# Patient Record
Sex: Male | Born: 1950 | Race: White | Hispanic: No | Marital: Married | Smoking: Former smoker
Health system: Southern US, Community
[De-identification: ages and names within clinical notes are randomized; demographics above are authoritative.]

## PROBLEM LIST (undated history)

## (undated) DIAGNOSIS — G1221 Amyotrophic lateral sclerosis: Secondary | ICD-10-CM

## (undated) DIAGNOSIS — Z9109 Other allergy status, other than to drugs and biological substances: Secondary | ICD-10-CM

---

## 2015-06-23 DIAGNOSIS — I2510 Atherosclerotic heart disease of native coronary artery without angina pectoris: Secondary | ICD-10-CM | POA: Diagnosis present

## 2015-06-23 DIAGNOSIS — Z789 Other specified health status: Secondary | ICD-10-CM | POA: Insufficient documentation

## 2020-10-20 ENCOUNTER — Other Ambulatory Visit (HOSPITAL_COMMUNITY): Payer: Self-pay | Admitting: *Deleted

## 2020-10-20 DIAGNOSIS — R1310 Dysphagia, unspecified: Secondary | ICD-10-CM

## 2020-10-26 ENCOUNTER — Ambulatory Visit (HOSPITAL_COMMUNITY)
Admission: RE | Admit: 2020-10-26 | Discharge: 2020-10-26 | Disposition: A | Discharge status: Home / Self Care | Payer: Medicare Other | Source: Ambulatory Visit | Attending: Neurology | Admitting: Neurology

## 2020-10-26 ENCOUNTER — Other Ambulatory Visit: Payer: Self-pay

## 2020-10-26 DIAGNOSIS — R1310 Dysphagia, unspecified: Secondary | ICD-10-CM | POA: Insufficient documentation

## 2020-10-26 NOTE — Progress Notes (Addendum)
Modified Barium Swallow Progress Note  Patient Details  Name: Donald Gould MRN: [id number] Date of Birth: 03/09/1951  Today's Date: 10/26/2020  Modified Barium Swallow completed.  Full report located under Chart Review in the Imaging Section.  Brief recommendations include the following:  Clinical Impression  Pt presents with functional oropharyngeal swallow c/b adequate bolus cohesion, BOT retraction and strength of laryngeal vestibule closure, resulting in no aspiration of solid or liquid POs. Occasional flash penetration noted with thin liquids, however strength of airway closure ejected material fully (PAS 2).  Some prolonged bolus formation noted and question reported xerostomia vs oral weakness. Mildly reduced pharyngeal constriction resulted in residue in the vallecula, pyriforms and posterior pharyngeal wall. Subsequent spontaneous dry swallows eliminated this presentation. Recommend regular/thin liquid diet with universal swallow precautions (small bites/sips, slow rate of intake, upright positioning). Wife states that choking episodes do not occur daily, but seem to happen in the evening time and question impact of lethargy/muscle fatigue. Educated wife and pt regarding swallow precautions and need for increased precaution when more fatigued. Referred to OP SLP services to address further.   Swallow Evaluation Recommendations       SLP Diet Recommendations: Regular solids;Thin liquid   Liquid Administration via: Cup;Straw   Medication Administration: Whole meds with liquid   Supervision: Patient able to self feed   Compensations: Minimize environmental distractions;Slow rate;Small sips/bites;Follow solids with liquid   Postural Changes: Seated upright at 90 degrees   Oral Care Recommendations: Oral care BID      Donald Gould, [place], [place] Office Number: [phone]   Acie Fredrickson 10/26/2020,4:14 PM

## 2020-11-23 ENCOUNTER — Other Ambulatory Visit: Payer: Self-pay

## 2020-11-23 ENCOUNTER — Encounter: Payer: Self-pay | Admitting: Speech Pathology

## 2020-11-23 ENCOUNTER — Ambulatory Visit: Payer: Medicare Other | Attending: Neurology | Admitting: Speech Pathology

## 2020-11-23 DIAGNOSIS — R1312 Dysphagia, oropharyngeal phase: Secondary | ICD-10-CM | POA: Diagnosis present

## 2020-11-23 DIAGNOSIS — R471 Dysarthria and anarthria: Secondary | ICD-10-CM | POA: Diagnosis not present

## 2020-11-23 NOTE — Therapy (Signed)
[place] MAIN [facility] SERVICES [address] [place], [place], [address] Phone: [phone]   Fax:  [phone]  Speech Language Pathology Evaluation  Patient Details  Name: Donald Gould MRN: [id number] Date of Birth: 01/31/1951 Referring Provider (SLP): Silvestre Moment, MD   Encounter Date: 11/23/2020   End of Session - 11/23/20 1351     Visit Number 1    Number of Visits 12    Date for SLP Re-Evaluation 02/21/21    SLP Start Time 1100    SLP Stop Time  1200    SLP Time Calculation (min) 60 min    Activity Tolerance Patient tolerated treatment well             History reviewed. No pertinent past medical history.  History reviewed. No pertinent surgical history.  There were no vitals filed for this visit.   Subjective Assessment - 11/23/20 1107     Subjective "Speech is the worst."                SLP Evaluation [facility] - 11/23/20 1314       SLP Visit Information   SLP Received On 11/23/20    Referring Provider (SLP) Silvestre Moment, MD    Onset Date 10/23/2020   referral date; pt reports onset in 2020   Medical Diagnosis speech disturbance, dysphagia      General Information   HPI Patient is a 70 year old male with past medical history of TIA, elevated PSA, referred by neurologist in [facility] for slurred speech and dysphagia. Per neurologist, DDx includes Frontotemporal dementia, PSP. 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 Recently relocated to [facility] [place] [facility]. Patient had gradual speech changes beginning in 2020. This has been progressive, and patient began to notice difficulty with swallowing.  MBS performed 10/26/2020 with recommendation for regular/thin diet with aspiration precautions; residue was noted in the valleculae, pyriforms, and posterior pharyngeal wall.    Behavioral/Cognition alert, cooperative, pleasant mood    Mobility  Status ambulated unassisted      Balance Screen   Has the patient fallen in the past 6 months No    Has the patient had a decrease in activity level because of a fear of falling?  No    Is the patient reluctant to leave their home because of a fear of falling?  No      Prior Functional Status   Cognitive/Linguistic Baseline Within functional limits    Type of Home House     Lives With Spouse    Available Support Family    Vocation Retired      Pain Assessment   Pain Assessment No/denies pain      Cognition   Overall Cognitive Status Within Functional Limits for tasks assessed      Auditory Comprehension   Overall Auditory Comprehension Appears within functional limits for tasks assessed      Visual Recognition/Discrimination   Discrimination Not tested      Reading Comprehension   Reading Status Within funtional limits      Expression   Primary Mode of Expression Verbal      Verbal Expression   Overall Verbal Expression Appears within functional limits for tasks assessed    Initiation No impairment    Automatic Speech Name;Social Response    Level of Generative/Spontaneous Verbalization Conversation    Repetition No impairment    Naming No impairment    Pragmatics No impairment  Written Expression   Written Expression Not tested      Oral Motor/Sensory Function   Overall Oral Motor/Sensory Function Impaired    Labial ROM Reduced right;Reduced left    Labial Symmetry Within Functional Limits    Labial Strength Reduced    Lingual ROM Reduced right;Reduced left    Lingual Symmetry Within Functional Limits    Lingual Strength Reduced   weakness, appearance of atrophy, ?fasciulations vs tremor   Lingual Coordination Reduced    Facial ROM Reduced right;Reduced left    Facial Symmetry Within Functional Limits    Velum Within Functional Limits    Mandible Within Functional Limits    Overall Oral Motor/Sensory Function moderate impairment      Motor Speech    Overall Motor Speech Impaired    Respiration Within functional limits    Phonation Wet;Low vocal intensity;Other (comment)   gravelly quality   Articulation Impaired    Level of Impairment Phrase    Intelligibility Intelligibility reduced    Conversation 75-100% accurate   90% for trained listener in quiet environment     Standardized Assessments   Standardized Assessments  Other Assessment ; see motor speech evaluation below            Motor Speech Evaluation    Oral Motor Examination:   Alternating Motion Rate: 7-11 repetitions in 5 seconds (30-35 repetitions/ 5 seconds average for /p/)  Sequential Motion Rate: 4 repetitions in 5 seconds (slow, regular, labored)   Respiration: appears WFL    Phonation   Vocal quality: wet, tremulous, gravelly  Maximum phonation time for sustained "ah": 8.9 seconds   Average fundamental frequency during sustained "ah": '100Hz'$    (1.9 SD below average of  145 Hz +/- 23 for gender)    Habitual pitch: 105 Hz  Average dB conversation: 78 at 50 cm    Highest dynamic pitch in conversational speech: 127 Hz   Lowest dynamic pitch in conversational speech: 92 Hz   Average time patient was able to sustain /s/:  4.3 seconds   Average time patient was able to sustain /z/: 4.2 seconds   s/z ratio:  (suggestive of dysfunction > 1.0) 1.02  Resonance:  mild hypernasality in connected speech  Articulation  Connected Speech characteristics: slow rate, imprecise consonants (particularly lingual consonants, plosives), wet, gravelly vocal quality, low pitch  Intelligibility: For trained listener with context in a quiet environment, intelligibility rated as approximately 90 %  Non-verbal oral apraxia: not present        SLP Education - 11/23/20 1350     Education Details changes in speech appear neurological, course of ST would be focused on compensations/ communicative effectiveness vs restoration    Person(s) Educated Patient;Spouse     Methods Explanation    Comprehension Verbalized understanding              SLP Short Term Goals - 11/23/20 1353       SLP SHORT TERM GOAL #1   Title Patient will demonstrate dysarthria HEP with modified independence.    Time 10    Period --   visits   Status New      SLP SHORT TERM GOAL #2   Title Patient will use compensations for dysarthria for paragraph reading level 90% accuracy.    Time 10    Period Weeks    Status New      SLP SHORT TERM GOAL #3   Title Patient will complete clinical swallowing assessment with goals added PRN  Time 2    Period Weeks    Status New              SLP Long Term Goals - 11/23/20 1356       SLP LONG TERM GOAL #1   Title Patient will use compensations for dysarthria in 15 minutes mod complex conversation for greater than 95% intelligibility.    Time 12    Period Weeks    Status New    Target Date 02/21/21      SLP LONG TERM GOAL #2   Title Patient will verbalize signs/symptoms of aspiration and aspiration pneumonia.    Time 12    Period Weeks    Status New    Target Date 02/21/21      SLP LONG TERM GOAL #3   Title Patient will demonstrate knowledge of external aids/apps for augmentative/alternative communication    Time 12    Period Weeks    Status New    Target Date 02/21/21              Plan - 11/23/20 1351     Clinical Impression Statement Patient presents with moderate mixed flaccid-spastic dysarthria characterized by slow rate, weak, imprecise consonants (linguals, plosives), wet/gravelly vocal quality, excess/equal stress and mild hypernasality in connected speech. Presence of lingual atrophy, ?lingual fasciculations vs tremor, lingual and labial weakness, decreased range of motion bilaterally. Patient reports gradual deterioration in speech, swallowing. He feels some days are worse than others; he has not noticed pattern or time of day when speech is worse. Recently moved to area and is establishing care with  a neurologist; first appointment is in November. SLP educated on proposed course of therapy focusing on maintaining communicative and swallowing function, compensatory strategies and augmentative/alternative communication given what appears to be progressive neurologic dysarthria and dysphagia.   Speech Therapy Frequency 1x /week    Duration 12 weeks    Treatment/Interventions Aspiration precaution training;Cueing hierarchy;SLP instruction and feedback;Pharyngeal strengthening exercises;Compensatory techniques;Functional tasks;Compensatory strategies;Diet toleration management by SLP;Trials of upgraded texture/liquids;Multimodal communcation approach;Internal/external aids;Patient/family education    Potential to Achieve Goals Fair    Potential Considerations Medical prognosis   suspect progressive neurologic etiology   Consulted and Agree with Plan of Care Patient;Family member/caregiver    Family Member Consulted wife             Patient will benefit from skilled therapeutic intervention in order to improve the following deficits and impairments:   Dysarthria and anarthria  Dysphagia, oropharyngeal phase    Problem List There are no problems to display for this patient.  Deneise Lever, [place], CCC-SLP Speech-Language Pathologist   Aliene Altes 11/23/2020, 6:23 PM  [place] MAIN [facility] SERVICES [address] [place], [place], [address] Phone: [phone]   Fax:  [phone]  Name: Donald Gould MRN: [id number] Date of Birth: 05-11-1950

## 2020-11-28 NOTE — Addendum Note (Signed)
Addended by: Aliene Altes on: 11/28/2020 05:09 PM   Modules accepted: Orders

## 2020-11-29 ENCOUNTER — Ambulatory Visit: Payer: Medicare Other | Admitting: Speech Pathology

## 2020-11-29 ENCOUNTER — Other Ambulatory Visit: Payer: Self-pay

## 2020-11-29 DIAGNOSIS — R471 Dysarthria and anarthria: Secondary | ICD-10-CM | POA: Diagnosis not present

## 2020-11-29 DIAGNOSIS — R1312 Dysphagia, oropharyngeal phase: Secondary | ICD-10-CM

## 2020-11-29 NOTE — Patient Instructions (Addendum)
Swallowing precautions  Sit fully upright when you eat and drink.  Take small bites and sips. One bite/sip at a time. Swallow everything in your mouth before you take another bite or sip.  Particularly when you eat solid food, you should swallow twice. You can also take a sip of liquid to help wash things down.  Your swallow test showed that you sometimes have food leftover in your mouth and in your throat after you swallow. Food that doesn't go down increases the risk that you will aspirate it . Aspiration is when something goes "down the wrong pipe" (your windpipe instead of your esophagus). You didn't aspirate on your swallow test, but you are at increased risk for this happening since all of the food/liquid doesn't go down at once. If you are coughing, choking, or "getting strangled," this can be a sign that you might be aspirating. If your swallowing gets worse, let your doctor or speech therapist know so that we can do a test to see if you may be aspirating, and to see what is the safest thing for you to eat and drink.  Aspiration can lead to developing aspiration pneumonia.    Signs of Aspiration Pneumonia   Chest pain/tightness Fever (can be low grade) Cough  With foul-smelling phlegm (sputum) With sputum containing pus or blood With greenish sputum Fatigue  Shortness of breath  Wheezing   **IF YOU HAVE THESE SIGNS, CONTACT YOUR DOCTOR OR GO TO THE EMERGENCY DEPARTMENT OR URGENT CARE AS SOON AS POSSIBLE**       Speech Practice  SLOW LOUD OVER-ENNUNCIATE PAUSE   Every Day Sayings (you can add more as you think of them: things you say a lot, people you talk to frequently, places you go, things you watch on TV, etc). Practice these using your SLOP strategies.   What time are we playing golf?  Where is our Bubba cup?  Where should we go to church?  When are we going to the grocery store?  I need grapes and a melon.   Good morning.   What's for  supper?  [name]  Donald  [employer]  [employer]  [employer]  [employer]  [employer]     Additional Practice:    SLOW AND BIG - EXAGGERATE YOUR MOUTH, MAKE EACH CONSONANT. Repeat each one 3 times.   Call the cat "Buttercup" A calendar of [place], [place] Four floors to cover Yellow oil ointment Fellow lovers of felines Catastrophe in Alfred' plums The church's chimes chimed Telling time 'til eleven Five valve levers Keep the gate closed Go see that guy Fat cows give milk [employer] Gophers Fat frogs flip freely Kohl's into bed Get that game to Greg Thick thistles stick together Cinnamon aluminum linoleum Black bugs blood Lovely lemon linament Red leather, yellow leather  Big grocery buggy    Purple baby carriage [facility] Proper copper coffee pot Ripe purple cabbage Three free throws [employer] tackled  [employer] dipped the dessert  Duke Golovin that [employer] of [employer] Shirts shrink, shells shouldn't [place] 49ers Take the tackle box File the flash message Give me five flapjacks Fundamental relatives Dye the pets purple Talking [place] time after time Dark chocolate chunks Political landscape of the kingdom Estate manager/land agent genius We played yo-yos yesterday

## 2020-11-30 NOTE — Therapy (Signed)
[place] MAIN [facility] SERVICES [address] [place], [place], [address] Phone: [phone]   Fax:  [phone]  Speech Language Pathology Treatment  Patient Details  Name: Donald Gould MRN: [id number] Date of Birth: [birth year] Referring Provider (SLP): Silvestre Moment, MD   Encounter Date: 11/29/2020   End of Session - 11/30/20 1018     Visit Number 2    Number of Visits 12    Date for SLP Re-Evaluation 02/21/21    SLP Start Time 1500    SLP Stop Time  1600    SLP Time Calculation (min) 60 min    Activity Tolerance Patient tolerated treatment well             No past medical history on file.  No past surgical history on file.  There were no vitals filed for this visit.   Subjective Assessment - 11/29/20 1502     Subjective "My lips aren't tingling but it's like they're numb."    Currently in Pain? No/denies                   ADULT SLP TREATMENT - 11/30/20 0848       General Information   Behavior/Cognition Alert;Cooperative;Pleasant mood    HPI Patient is a 70 year old male with past medical history of TIA, elevated PSA, referred by neurologist in [facility] for slurred speech and dysphagia. Per neurologist, DDx includes Frontotemporal dementia, PSP. 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 Recently relocated to [facility]. Patient had gradual speech changes beginning in 2020. This has been progressive, and patient began to notice difficulty with swallowing.  MBS performed 10/26/2020 with recommendation for regular/thin diet with aspiration precautions; residue was noted in the valleculae, pyriforms, and posterior pharyngeal wall.      Treatment Provided   Treatment provided Dysphagia;Cognitive-Linquistic      Dysphagia Treatment   Temperature Spikes Noted No    Respiratory Status Room air    Oral Cavity - Dentition Adequate  natural dentition    Treatment Methods Skilled observation;Differential diagnosis;Compensation strategy training;Patient/caregiver education    Patient observed directly with PO's Yes    Type of PO's observed Regular;Dysphagia 1 (puree)    Feeding Able to feed self    Liquids provided via Cup    Oral Phase Signs & Symptoms Prolonged mastication;Prolonged bolus formation;Other (comment)   oral residue, copious thick saliva   Pharyngeal Phase Signs & Symptoms Delayed cough    Type of cueing Verbal    Amount of cueing Minimal    Other treatment/comments Administered clinical swallowing evaluation, using thin liquids, puree and regular solids. Patient self-fed; he reported feeling his mouth was "dry" however had copious saliva; further questioning reveals that pt perceives dry mouth due to the thickness of his saliva. Noted with multiple swallows (piecemealing? vs clearance of pharyngeal residue) per bite/sip. There is diffuse oral residue with solids, mixed with thick secretions throughout oral cavity. No overt signs of aspiration initially, however pt had delayed cough x1 approximately 60 seconds after PO administration; suspect related to secretion management. Pt acknowledged he coughs on his saliva. Reviewed aspiration precautions and strategies including multiple swallows, alternating solids and liquids after reviewing his MBS (both report and images). Provided handout with precautions and signs of aspiration PNA.      Pain Assessment   Pain Assessment No/denies pain      Cognitive-Linquistic Treatment   Treatment focused on Dysarthria;Patient/family/caregiver  education    Skilled Treatment Educated on dysarthria compensations and collaborated with patient to generate list of personally relevant words and phrases. Reviewed strategies, specifically pausing between words to increase intelligibility, and pt returned demonstration with mod cues. Provided HEP      Assessment / Recommendations / Plan    Plan Continue with current plan of care      Progression Toward Goals   Progression toward goals Progressing toward goals                SLP Short Term Goals - 11/23/20 1353       SLP SHORT TERM GOAL #1   Title Patient will demonstrate dysarthria HEP with modified independence.    Time 10    Period --   visits   Status New      SLP SHORT TERM GOAL #2   Title Patient will use compensations for dysarthria for paragraph reading level 90% accuracy.    Time 10    Period Weeks    Status New      SLP SHORT TERM GOAL #3   Title Patient will complete clinical swallowing assessment with goals added PRN    Time 2    Period Weeks    Status New              SLP Long Term Goals - 11/23/20 1356       SLP LONG TERM GOAL #1   Title Patient will use compensations for dysarthria in 15 minutes mod complex conversation for greater than 95% intelligibility.    Time 12    Period Weeks    Status New    Target Date 02/21/21      SLP LONG TERM GOAL #2   Title Patient will verbalize signs/symptoms of aspiration and aspiration pneumonia.    Time 12    Period Weeks    Status New    Target Date 02/21/21      SLP LONG TERM GOAL #3   Title Patient will demonstrate knowledge of external aids/apps for augmentative/alternative communication    Time 12    Period Weeks    Status New    Target Date 02/21/21              Plan - 11/30/20 1019     Clinical Impression Statement Patient presents with moderate mixed flaccid-spastic dysarthria characterized by slow rate, weak, imprecise consonants (linguals, plosives), wet/gravelly vocal quality, excess/equal stress and mild hypernasality in connected speech. Presence of lingual atrophy, ?lingual fasciculations vs tremor, lingual and labial weakness, decreased range of motion bilaterally. Clinical swallowing assessment today confirms oral deficits/weakness and excessive, thick saliva. Reviewed aspiration precautions with patient and  provided handout. Continue skilled ST with focus on maintaining communicative and swallowing function, compensatory strategies and augmentative/alternative communication given what appears to be progressive neurologic dysarthria and dysphagia.    Speech Therapy Frequency 1x /week    Duration 12 weeks    Treatment/Interventions Aspiration precaution training;Cueing hierarchy;SLP instruction and feedback;Pharyngeal strengthening exercises;Compensatory techniques;Functional tasks;Compensatory strategies;Diet toleration management by SLP;Trials of upgraded texture/liquids;Multimodal communcation approach;Internal/external aids;Patient/family education    Potential to Achieve Goals Fair    Potential Considerations Medical prognosis   suspect progressive neurologic etiology   Consulted and Agree with Plan of Care Patient;Family member/caregiver    Family Member Consulted wife             Patient will benefit from skilled therapeutic intervention in order to improve the following deficits and impairments:   Dysphagia,  oropharyngeal phase  Dysarthria and anarthria    Problem List There are no problems to display for this patient.  Deneise Lever, [place], CCC-SLP Speech-Language Pathologist   Aliene Altes 11/30/2020, 10:26 AM  [place] MAIN [facility] SERVICES [address] [place], [place], [address] Phone: [phone]   Fax:  [phone]   Name: Donald Gould MRN: [id number] Date of Birth: [birth year]

## 2020-12-06 ENCOUNTER — Other Ambulatory Visit: Payer: Self-pay

## 2020-12-06 ENCOUNTER — Ambulatory Visit: Payer: Medicare Other | Admitting: Speech Pathology

## 2020-12-06 DIAGNOSIS — R471 Dysarthria and anarthria: Secondary | ICD-10-CM | POA: Diagnosis not present

## 2020-12-06 NOTE — Therapy (Signed)
[facility] [address] [place], [place], [address] Phone: [phone]   Fax:  [phone]  Speech Language Pathology Treatment  Patient Details  Name: [facility] MRN: [id number] Date of Birth: 1950/05/03 Referring Provider (SLP): Silvestre Moment, MD   Encounter Date: 12/06/2020   End of Session - 12/06/20 1600     Visit Number 3    Number of Visits 12    Date for SLP Re-Evaluation 02/21/21    SLP Start Time 20    SLP Stop Time  1600    SLP Time Calculation (min) 55 min    Activity Tolerance Patient tolerated treatment well             No past medical history on file.  No past surgical history on file.  There were no vitals filed for this visit.   Subjective Assessment - 12/06/20 1557     Subjective "I tried to put some pauses in. It helps."    Currently in Pain? No/denies                   ADULT SLP TREATMENT - 12/06/20 1557       General Information   Behavior/Cognition Alert;Cooperative;Pleasant mood    HPI Patient is a 70 year old male with past medical history of TIA, elevated PSA, referred by neurologist in [facility] for slurred speech and dysphagia. Per neurologist, DDx includes Frontotemporal dementia, PSP. 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 Recently relocated to [facility]. Patient had gradual speech changes beginning in 2020. This has been progressive, and patient began to notice difficulty with swallowing.  MBS performed 10/26/2020 with recommendation for regular/thin diet with aspiration precautions; residue was noted in the valleculae, pyriforms, and posterior pharyngeal wall.      Treatment Provided   Treatment provided Cognitive-Linquistic      Cognitive-Linquistic Treatment   Treatment focused on Dysarthria;Patient/family/caregiver education    Skilled Treatment Continued training in  dysarthria compensations. Patient benefitted from audio recording with visual feedback (sound level meter); able to discern improved articulation when focusing on louder speech, slower rate. Required cues intermittently in conversation, with need for repetition on 3 occasions. Provided article of interest (golf) for reading aloud/home practice.      Assessment / Recommendations / Plan   Plan Continue with current plan of care      Progression Toward Goals   Progression toward goals Progressing toward goals              SLP Education - 12/06/20 1600     Education Details Slow, loud, overarticulate, pause    Person(s) Educated Patient    Methods Explanation    Comprehension Verbalized understanding;Need further instruction;Returned demonstration              SLP Short Term Goals - 11/23/20 1353       SLP SHORT TERM GOAL #1   Title Patient will demonstrate dysarthria HEP with modified independence.    Time 10    Period --   visits   Status New      SLP SHORT TERM GOAL #2   Title Patient will use compensations for dysarthria for paragraph reading level 90% accuracy.    Time 10    Period Weeks    Status New      SLP SHORT TERM GOAL #3   Title Patient will complete clinical swallowing assessment with goals added PRN  Time 2    Period Weeks    Status New              SLP Long Term Goals - 11/23/20 1356       SLP LONG TERM GOAL #1   Title Patient will use compensations for dysarthria in 15 minutes mod complex conversation for greater than 95% intelligibility.    Time 12    Period Weeks    Status New    Target Date 02/21/21      SLP LONG TERM GOAL #2   Title Patient will verbalize signs/symptoms of aspiration and aspiration pneumonia.    Time 12    Period Weeks    Status New    Target Date 02/21/21      SLP LONG TERM GOAL #3   Title Patient will demonstrate knowledge of external aids/apps for augmentative/alternative communication    Time 12     Period Weeks    Status New    Target Date 02/21/21              Plan - 12/06/20 1601     Clinical Impression Statement Patient presents with moderate mixed flaccid-spastic dysarthria characterized by slow rate, weak, imprecise consonants (linguals, plosives), wet/gravelly vocal quality, excess/equal stress and mild hypernasality in connected speech. Presence of lingual atrophy, ?lingual fasciculations vs tremor, lingual and labial weakness, decreased range of motion bilaterally. Clinical swallowing assessment confirms oral deficits/weakness and excessive, thick saliva. Continue skilled ST with focus on maintaining communicative and swallowing function, compensatory strategies and augmentative/alternative communication given what appears to be progressive neurologic dysarthria and dysphagia.    Speech Therapy Frequency 1x /week    Duration 12 weeks    Treatment/Interventions Aspiration precaution training;Cueing hierarchy;SLP instruction and feedback;Pharyngeal strengthening exercises;Compensatory techniques;Functional tasks;Compensatory strategies;Diet toleration management by SLP;Trials of upgraded texture/liquids;Multimodal communcation approach;Internal/external aids;Patient/family education    Potential to Achieve Goals Fair    Potential Considerations Medical prognosis   suspect progressive neurologic etiology   Consulted and Agree with Plan of Care Patient;Family member/caregiver    Family Member Consulted wife             Patient will benefit from skilled therapeutic intervention in order to improve the following deficits and impairments:   Dysarthria and anarthria    Problem List There are no problems to display for this patient.  Deneise Lever, [place], CCC-SLP Speech-Language Pathologist  Aliene Altes 12/06/2020, 4:02 PM  [place] MAIN [facility] SERVICES [address] [place], [place], [address] Phone: [phone]   Fax:   [phone]   Name: [facility] MRN: [id number] Date of Birth: 1950/03/30

## 2020-12-14 ENCOUNTER — Ambulatory Visit: Payer: Medicare Other | Admitting: Speech Pathology

## 2020-12-14 ENCOUNTER — Other Ambulatory Visit: Payer: Self-pay

## 2020-12-14 DIAGNOSIS — R471 Dysarthria and anarthria: Secondary | ICD-10-CM | POA: Diagnosis not present

## 2020-12-14 DIAGNOSIS — R1312 Dysphagia, oropharyngeal phase: Secondary | ICD-10-CM

## 2020-12-14 NOTE — Patient Instructions (Signed)
   Foods that may be easier to eat:  Well-moistened breads. You may need to add jelly, butter, or other soft spread.  All well-moistened cereals  Moist desserts that don't contain nuts, seeds, or dried fruit  All canned and cooked fruits, soft and peeled fresh fruit  Tender, thin-sliced meats and fish  Any kind of eggs  Yogurt  Rice, noodles, and most cooked potatoes  Most soups  Cottage cheese, tofu, and well-cooked beans  All cooked, tender vegetables  Most non-chewy candies    Foods that might cause you trouble:   Dry bread, crackers, and tough breads  Very coarse cereals or dry cereals  Cookies or cakes that are very dry or that contain nuts, dried fruit, seeds, or other hard pieces  Fresh fruit that is hard to chew  Dry or tough meats  Chunky peanut butter  Potato skins, potato chips, corn, or popcorn  Most raw vegetables  Nuts, seeds, or coconut  Very sticky foods, like chewy candies

## 2020-12-14 NOTE — Therapy (Signed)
[place] MAIN [facility] SERVICES [address] [place], [place], [address] Phone: [phone]   Fax:  [phone]  Speech Language Pathology Treatment  Patient Details  Name: Donald Gould MRN: [id number] Date of Birth: 04/08/1950 Referring Provider (SLP): Silvestre Moment, MD   Encounter Date: 12/14/2020   End of Session - 12/14/20 1402     Visit Number 4    Number of Visits 12    Date for SLP Re-Evaluation 02/21/21    SLP Start Time 1400    SLP Stop Time  1455    SLP Time Calculation (min) 55 min    Activity Tolerance Patient tolerated treatment well             No past medical history on file.  No past surgical history on file.  There were no vitals filed for this visit.   Subjective Assessment - 12/14/20 1302     Subjective "I've got so much saliva."                   ADULT SLP TREATMENT - 12/14/20 1357       General Information   Behavior/Cognition Alert;Cooperative;Pleasant mood    HPI Patient is a 70 year old male with past medical history of TIA, elevated PSA, referred by neurologist in [facility] for slurred speech and dysphagia. Per neurologist, DDx includes Frontotemporal dementia, PSP. 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 Recently relocated to [facility]. Patient had gradual speech changes beginning in 2020. This has been progressive, and patient began to notice difficulty with swallowing.  MBS performed 10/26/2020 with recommendation for regular/thin diet with aspiration precautions; residue was noted in the valleculae, pyriforms, and posterior pharyngeal wall.      Treatment Provided   Treatment provided Dysphagia;Cognitive-Linquistic      Dysphagia Treatment   Temperature Spikes Noted No    Respiratory Status Room air    Oral Cavity - Dentition Adequate natural dentition    Treatment Methods Patient/caregiver  education    Patient observed directly with PO's Yes    Type of PO's observed Thin liquids    Feeding Able to feed self    Liquids provided via Cup    Oral Phase Signs & Symptoms --   none   Pharyngeal Phase Signs & Symptoms --   none   Amount of cueing Independent    Other treatment/comments Patient reports fewer coughing episodes when following precautions of small bites/sips, slower rate. Notices difficulty with dry foods and tough meats. Educated on modifying with soft foods if this is more comfortable; provided handout and education on Dysphagia 3 textures/ foods that may cause difficulty. Provided cough journal for pt to log episodes of swallowing difficulty.      Pain Assessment   Pain Assessment No/denies pain      Cognitive-Linquistic Treatment   Treatment focused on Dysarthria;Patient/family/caregiver education    Skilled Treatment Patient reported "my brother can understand me better." Feels he is more confident in communicating with contractors at his home using dysarthria compensations. Reviewed HEP/ tongue twisters and intelligibility strategies. Pt self-monitored using slow rate during conversation today.      Assessment / Recommendations / Plan   Plan Continue with current plan of care      Progression Toward Goals   Progression toward goals Progressing toward goals              SLP Education - 12/14/20 1400  Education Details if progressive neurologic dysarthria, there are some technologies that will allow for voice banking to preserve ability to communicate in his own voice.    Person(s) Educated Patient    Methods Explanation    Comprehension Verbalized understanding              SLP Short Term Goals - 11/23/20 1353       SLP SHORT TERM GOAL #1   Title Patient will demonstrate dysarthria HEP with modified independence.    Time 10    Period --   visits   Status New      SLP SHORT TERM GOAL #2   Title Patient will use compensations for  dysarthria for paragraph reading level 90% accuracy.    Time 10    Period Weeks    Status New      SLP SHORT TERM GOAL #3   Title Patient will complete clinical swallowing assessment with goals added PRN    Time 2    Period Weeks    Status New              SLP Long Term Goals - 11/23/20 1356       SLP LONG TERM GOAL #1   Title Patient will use compensations for dysarthria in 15 minutes mod complex conversation for greater than 95% intelligibility.    Time 12    Period Weeks    Status New    Target Date 02/21/21      SLP LONG TERM GOAL #2   Title Patient will verbalize signs/symptoms of aspiration and aspiration pneumonia.    Time 12    Period Weeks    Status New    Target Date 02/21/21      SLP LONG TERM GOAL #3   Title Patient will demonstrate knowledge of external aids/apps for augmentative/alternative communication    Time 12    Period Weeks    Status New    Target Date 02/21/21              Plan - 12/14/20 1601     Clinical Impression Statement Patient presents with moderate mixed flaccid-spastic dysarthria characterized by slow rate, weak, imprecise consonants (linguals, plosives), wet/gravelly vocal quality, excess/equal stress and mild hypernasality in connected speech. Presence of lingual atrophy, ?lingual fasciculations vs tremor, lingual and labial weakness, decreased range of motion bilaterally. Clinical swallowing assessment confirms oral deficits/weakness and excessive, thick saliva. Patient using compensations for dysarthria effectively in conversation today with min cues. Reports being more mindful of selecting foods and taking smaller bites. Discussed plan with pt today and will consider placing ST on hold once pt using strategies independently, until he is assessed by [facility] in November. Continue skilled ST with focus on maintaining communicative and swallowing function, compensatory strategies and augmentative/alternative communication given  what appears to be progressive neurologic dysarthria and dysphagia.    Speech Therapy Frequency 1x /week    Duration 12 weeks    Treatment/Interventions Aspiration precaution training;Cueing hierarchy;SLP instruction and feedback;Pharyngeal strengthening exercises;Compensatory techniques;Functional tasks;Compensatory strategies;Diet toleration management by SLP;Trials of upgraded texture/liquids;Multimodal communcation approach;Internal/external aids;Patient/family education    Potential to Achieve Goals Fair    Potential Considerations Medical prognosis   suspect progressive neurologic etiology   Consulted and Agree with Plan of Care Patient;Family member/caregiver    Family Member Consulted wife             Patient will benefit from skilled therapeutic intervention in order to improve the following deficits and  impairments:   Dysarthria and anarthria  Dysphagia, oropharyngeal phase    Problem List There are no problems to display for this patient.  Deneise Lever, [place], CCC-SLP Speech-Language Pathologist  Aliene Altes 12/14/2020, 4:04 PM  [place] MAIN [facility] SERVICES [address] [place], [place], [address] Phone: [phone]   Fax:  [phone]   Name: Ildefonso Keaney MRN: [id number] Date of Birth: [birth year]

## 2020-12-26 ENCOUNTER — Ambulatory Visit: Payer: Medicare Other | Attending: Neurology | Admitting: Speech Pathology

## 2020-12-26 ENCOUNTER — Other Ambulatory Visit: Payer: Self-pay

## 2020-12-26 DIAGNOSIS — R471 Dysarthria and anarthria: Secondary | ICD-10-CM | POA: Diagnosis not present

## 2020-12-26 NOTE — Therapy (Signed)
[place] MAIN [facility] SERVICES [address] [place], [place], [address] Phone: [phone]   Fax:  [phone]  Speech Language Pathology Treatment  Patient Details  Name: [facility] MRN: [id number] Date of Birth: June 05, 1950 Referring Provider (SLP): Silvestre Moment, MD   Encounter Date: 12/26/2020   End of Session - 12/26/20 1649     Visit Number 5    Number of Visits 12    Date for SLP Re-Evaluation 02/21/21    SLP Start Time 1400    SLP Stop Time  1445    SLP Time Calculation (min) 45 min    Activity Tolerance Patient tolerated treatment well             No past medical history on file.  No past surgical history on file.  There were no vitals filed for this visit.   Subjective Assessment - 12/26/20 1410     Subjective "Did he talk to you about his saliva?"    Patient is accompained by: Family member   spouse, stayed in lobby   Currently in Pain? No/denies                   ADULT SLP TREATMENT - 12/26/20 1640       General Information   Behavior/Cognition Alert;Cooperative;Pleasant mood    HPI Patient is a 70 year old male with past medical history of TIA, elevated PSA, referred by neurologist in [facility] for slurred speech and dysphagia. Per neurologist, DDx includes Frontotemporal dementia, PSP. 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 Recently relocated to [facility]. Patient had gradual speech changes beginning in 2020. This has been progressive, and patient began to notice difficulty with swallowing.  MBS performed 10/26/2020 with recommendation for regular/thin diet with aspiration precautions; residue was noted in the valleculae, pyriforms, and posterior pharyngeal wall.      Treatment Provided   Treatment provided Cognitive-Linquistic      Cognitive-Linquistic Treatment   Treatment focused on  Dysarthria;Patient/family/caregiver education    Skilled Treatment Pt did not make any notes in cough journal; reports chosing foods more carefully when going out to eat and has not had any significant coughing/choking episodes. Pt admits he did not practice "pronunciation" but did read out loud. He had Covid-19 and was isolated until Friday. Has been coughing up a lot of mucus, per pt. Pt using compensations today in conversation for >95% intelligibility. Discussed with pt, spouse about placing ST on hold until pt sees [facility] neurologist next month. They are in agreement with this plan. Pt/spouse to contact SLP if worsening of speech/swallowing function occurs during this time. Pt has handout re: signs of dysphagia/aspiration PNA. Pt was introduced during last session to concept of communication modalities/voice banking should his neurologic w/u reveal progressive disease.      Assessment / Recommendations / Plan   Plan Other (Comment)   Place on hold pending neurology consult on 02/05/21     Progression Toward Goals   Progression toward goals Progressing toward goals              SLP Education - 12/26/20 1648     Education Details indications to contact SLP for appointment prior to neurology visit, if worsening of speech/swallowing function    Person(s) Educated Patient;Spouse    Methods Explanation    Comprehension Verbalized understanding              SLP Short Term  Goals - 12/26/20 1652       SLP SHORT TERM GOAL #1   Title Patient will demonstrate dysarthria HEP with modified independence.    Time 10    Period --   visits   Status On-going      SLP SHORT TERM GOAL #2   Title Patient will use compensations for dysarthria for paragraph reading level 90% accuracy.    Time 10    Period Weeks    Status On-going      SLP SHORT TERM GOAL #3   Title Patient will complete clinical swallowing assessment with goals added PRN    Time 2    Period Weeks    Status Achieved               SLP Long Term Goals - 12/26/20 1653       SLP LONG TERM GOAL #1   Title Patient will use compensations for dysarthria in 15 minutes mod complex conversation for greater than 95% intelligibility.    Time 12    Period Weeks    Status On-going      SLP LONG TERM GOAL #2   Title Patient will verbalize signs/symptoms of aspiration and aspiration pneumonia.    Time 12    Period Weeks    Status On-going      SLP LONG TERM GOAL #3   Title Patient will demonstrate knowledge of external aids/apps for augmentative/alternative communication    Time 12    Period Weeks    Status On-going              Plan - 12/26/20 1649     Clinical Impression Statement Patient presents with moderate mixed flaccid-spastic dysarthria characterized by slow rate, weak, imprecise consonants (linguals, plosives), wet/gravelly vocal quality, excess/equal stress and mild hypernasality in connected speech. Presence of lingual atrophy, lingual fasciculations vs tremor, lingual and labial weakness, decreased range of motion bilaterally. Clinical swallowing assessment confirms oral deficits/weakness and excessive, thick saliva. Patient using compensations for dysarthria effectively in conversation today. Reports being more mindful of selecting foods and taking smaller bites, denies coughing/choking episodes since last visit. Pt/spouse in agreement with placing ST on hold until he is assessed by [facility] in November, unless pt has functional decline prior to that evaluation. Will resume ST 02/13/21 with focus on maintaining communicative and swallowing function, compensatory strategies and augmentative/alternative communication given what appears to be progressive neurologic dysarthria and dysphagia.    Speech Therapy Frequency 1x /week    Duration 12 weeks    Treatment/Interventions Aspiration precaution training;Cueing hierarchy;SLP instruction and feedback;Pharyngeal strengthening exercises;Compensatory  techniques;Functional tasks;Compensatory strategies;Diet toleration management by SLP;Trials of upgraded texture/liquids;Multimodal communcation approach;Internal/external aids;Patient/family education    Potential to Achieve Goals Fair    Potential Considerations Medical prognosis   suspect progressive neurologic etiology   Consulted and Agree with Plan of Care Patient;Family member/caregiver    Family Member Consulted wife             Patient will benefit from skilled therapeutic intervention in order to improve the following deficits and impairments:   Dysarthria and anarthria    Problem List There are no problems to display for this patient.  Deneise Lever, [place], CCC-SLP Speech-Language Pathologist  Aliene Altes 12/26/2020, 4:53 PM  [place] MAIN [facility] SERVICES [address] [place], [place], [address] Phone: [phone]   Fax:  [phone]   Name: Ison Wichmann MRN: [id number] Date of Birth: 1950/06/05

## 2021-01-02 ENCOUNTER — Ambulatory Visit: Payer: Medicare Other | Admitting: Speech Pathology

## 2021-01-09 ENCOUNTER — Encounter: Payer: Medicare Other | Admitting: Speech Pathology

## 2021-01-10 HISTORY — PX: RETINAL DETACHMENT SURGERY: SHX105

## 2021-01-16 ENCOUNTER — Encounter: Payer: Medicare Other | Admitting: Speech Pathology

## 2021-01-16 DIAGNOSIS — R634 Abnormal weight loss: Secondary | ICD-10-CM | POA: Insufficient documentation

## 2021-01-16 DIAGNOSIS — R471 Dysarthria and anarthria: Secondary | ICD-10-CM | POA: Insufficient documentation

## 2021-01-16 DIAGNOSIS — E7800 Pure hypercholesterolemia, unspecified: Secondary | ICD-10-CM | POA: Diagnosis present

## 2021-01-16 DIAGNOSIS — M791 Myalgia, unspecified site: Secondary | ICD-10-CM | POA: Insufficient documentation

## 2021-01-23 ENCOUNTER — Encounter: Payer: Medicare Other | Admitting: Speech Pathology

## 2021-01-30 ENCOUNTER — Encounter: Payer: Medicare Other | Admitting: Speech Pathology

## 2021-02-05 DIAGNOSIS — G1221 Amyotrophic lateral sclerosis: Secondary | ICD-10-CM | POA: Diagnosis present

## 2021-02-06 ENCOUNTER — Encounter: Payer: Medicare Other | Admitting: Speech Pathology

## 2021-02-13 ENCOUNTER — Other Ambulatory Visit: Payer: Self-pay

## 2021-02-13 ENCOUNTER — Ambulatory Visit: Payer: Medicare Other | Attending: Neurology | Admitting: Speech Pathology

## 2021-02-13 DIAGNOSIS — R1312 Dysphagia, oropharyngeal phase: Secondary | ICD-10-CM | POA: Diagnosis present

## 2021-02-13 DIAGNOSIS — R471 Dysarthria and anarthria: Secondary | ICD-10-CM | POA: Diagnosis present

## 2021-02-13 NOTE — Therapy (Signed)
[place] MAIN [facility] SERVICES [address] [place], [place], [address] Phone: [phone]   Fax:  [phone]  Speech Language Pathology Treatment  Patient Details  Name: Donald Gould MRN: [id number] Date of Birth: Jun 11, 1950 Referring Provider (SLP): Silvestre Moment, MD   Encounter Date: 02/13/2021   End of Session - 02/13/21 1559     Visit Number 6    Number of Visits 12    Date for SLP Re-Evaluation 02/21/21    SLP Start Time 1300    SLP Stop Time  1400    SLP Time Calculation (min) 60 min    Activity Tolerance Patient tolerated treatment well             No past medical history on file.  No past surgical history on file.  There were no vitals filed for this visit.   Subjective Assessment - 02/13/21 1536     Subjective Had neurology visit with Dr. 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 Has been referred to [facility] - no appointment yet. EMG scheduled in December.    Patient is accompained by: Family member   Spouse   Currently in Pain? No/denies                   ADULT SLP TREATMENT - 02/13/21 1539       General Information   Behavior/Cognition Alert;Cooperative;Pleasant mood    HPI Patient is a 70 year old male with past medical history of TIA, elevated PSA, referred by neurologist in [facility] for slurred speech and dysphagia. Per neurologist, DDx includes Frontotemporal dementia, PSP. 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 Recently relocated to [facility]. Patient had gradual speech changes beginning in 2020. This has been progressive, and patient began to notice difficulty with swallowing.  MBS performed 10/26/2020 with recommendation for regular/thin diet with aspiration precautions; residue was noted in the valleculae, pyriforms, and posterior pharyngeal wall.      Treatment Provided   Treatment provided  Dysphagia      Dysphagia Treatment   Temperature Spikes Noted No    Respiratory Status Room air    Oral Cavity - Dentition Adequate natural dentition    Treatment Methods Patient/caregiver education    Patient observed directly with PO's No    Other treatment/comments Focused session on patient/caregiver education given recent visit with neurology. Neurology introduced concept of feeding tube; patient appeared surprised by this. SLP educated pt and spouse on phases of swallowing and how motor neuron disease can affect each stage. Education provided on role of feeding tube to assist with maintaining nutrition/weight in addition to diet texture changes/strategies that can be adapted with disease progression. Wife asked whether exercise could improve pt's tongue weakness. Education provided on current research which shows that light to moderate intensity exercise may have some benefit (EMST), but that too much intensity can be detrimental for motor neuron disease. Insufficient research on whether lingual exercise is beneficial. Will consult with neurologist to determine whether light-moderate intensity EMST is appropriate for pt.  Spouse asked whether over-the-counter products pt is using for dry mouth (lozenges, Biotene, Act Rinse) may be impacting his saliva. Has dentist appointment next week, encouraged to discuss with dentist. Pt has excessive, thick saliva today; has been trying to swallow more frequently to manage this. Reviewed signs of dysphagia and aspiration pneumonia. Reviewed safe swallowing strategies (see handout) and indications for repeat swallow study.  Pain Assessment   Pain Assessment No/denies pain      Assessment / Recommendations / Plan   Plan Goals updated   Patient would like to attend 1x every other week.     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    Time 10    Period --   visits   Status On-going      SLP SHORT TERM GOAL #2   Title Patient will use compensations for dysarthria for paragraph reading level 90% accuracy.    Time 10    Period Weeks    Status On-going      SLP SHORT TERM GOAL #3   Title Patient will complete clinical swallowing assessment with goals added PRN    Time 2    Period Weeks    Status Achieved              SLP Long Term Goals - 12/26/20 1653       SLP LONG TERM GOAL #1   Title Patient will use compensations for dysarthria in 15 minutes mod complex conversation for greater than 95% intelligibility.    Time 12    Period Weeks    Status On-going      SLP LONG TERM GOAL #2   Title Patient will verbalize signs/symptoms of aspiration and aspiration pneumonia.    Time 12    Period Weeks    Status On-going      SLP LONG TERM GOAL #3   Title Patient will demonstrate knowledge of external aids/apps for augmentative/alternative communication    Time 12    Period Weeks    Status On-going              Plan - 02/13/21 1559     Clinical Impression Statement Patient presents with moderate mixed flaccid-spastic dysarthria characterized by slow rate, weak, imprecise consonants (linguals, plosives), wet/gravelly  vocal quality, excess/equal stress and mild hypernasality in connected speech. Presence of lingual atrophy, lingual fasciculations, lingual and labial weakness, decreased range of motion bilaterally. Pt has excessive, thick saliva. Patient using compensations for dysarthria effectively in conversation today. Recently evaluated by neurology at [facility], has EMG pending as well as referral to [facility]. 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    Speech Therapy Frequency 1x /week    Duration 12 weeks    Treatment/Interventions Aspiration precaution training;Cueing hierarchy;SLP instruction and feedback;Pharyngeal strengthening exercises;Compensatory techniques;Functional tasks;Compensatory strategies;Diet toleration management by SLP;Trials of upgraded texture/liquids;Multimodal communcation approach;Internal/external aids;Patient/family education  Potential to Achieve Goals Fair    Potential Considerations Medical prognosis   suspect progressive neurologic etiology   Consulted and Agree with Plan of Care Patient;Family member/caregiver    Family Member Consulted wife             Patient will benefit from skilled therapeutic intervention in order to improve the following deficits and impairments:   Dysphagia, oropharyngeal phase  Dysarthria and anarthria    Problem List There are no problems to display for this patient.  Deneise Lever, [place], CCC-SLP Speech-Language Pathologist  Aliene Altes, [employer] 02/13/2021, 4:05 PM  [place] MAIN [facility] SERVICES [address] [place], [place], [address] Phone: [phone]   Fax:  [phone]   Name: Donald Gould MRN: [id number] Date of Birth: 1950/08/14

## 2021-02-13 NOTE — Patient Instructions (Signed)
Swallowing tips  Chew everything and swallow before you drink something or take another bite. Softer foods with extra sauce and gravy might be easier to form into a ball and swallow.  It might help you to alternate solids and liquids (take a bite, swallow, then take sip of drink).   If you notice a particular food or drink makes you cough, make a note of it.   Signs of Aspiration Pneumonia   Chest pain/tightness Fever (can be low grade) Cough  With foul-smelling phlegm (sputum) With sputum containing pus or blood With greenish sputum Fatigue  Shortness of breath  Wheezing   **IF YOU HAVE THESE SIGNS, CONTACT YOUR DOCTOR OR GO TO THE EMERGENCY DEPARTMENT OR URGENT CARE AS SOON AS POSSIBLE**    Look up EMG on [facility] website for more information about the procedure.  Your doctor mentioned a feeding tube. This is called a PEG tube. It can be used as an alternative to eating by mouth, but it can also help you to supplement your diet if you aren't able to eat enough calories to maintain your weight.

## 2021-02-20 ENCOUNTER — Encounter: Payer: Medicare Other | Admitting: Speech Pathology

## 2021-02-27 ENCOUNTER — Other Ambulatory Visit: Payer: Self-pay

## 2021-02-27 ENCOUNTER — Ambulatory Visit: Payer: Medicare Other | Attending: Neurology | Admitting: Speech Pathology

## 2021-02-27 DIAGNOSIS — R1312 Dysphagia, oropharyngeal phase: Secondary | ICD-10-CM | POA: Diagnosis present

## 2021-02-27 DIAGNOSIS — R471 Dysarthria and anarthria: Secondary | ICD-10-CM | POA: Diagnosis present

## 2021-02-27 NOTE — Therapy (Signed)
[place] MAIN [facility] SERVICES [address] [place], [place], [address] Phone: [phone]   Fax:  [phone]  Speech Language Pathology Treatment  Patient Details  Name: Donald Gould MRN: [id number] Date of Birth: 19-Apr-1950 Referring Provider (SLP): Silvestre Moment, MD   Encounter Date: 02/27/2021   End of Session - 02/27/21 1359     Visit Number 7    Number of Visits 12    Date for SLP Re-Evaluation 02/21/21    SLP Start Time 1300    SLP Stop Time  7654    SLP Time Calculation (min) 55 min    Activity Tolerance Patient tolerated treatment well             No past medical history on file.  No past surgical history on file.  There were no vitals filed for this visit.   Subjective Assessment - 02/27/21 1353     Subjective Reports saliva is worse    Currently in Pain? No/denies                   ADULT SLP TREATMENT - 02/27/21 1353       General Information   Behavior/Cognition Alert;Cooperative;Pleasant mood    HPI Patient is a 70 year old male with past medical history of TIA, elevated PSA, referred by neurologist in [facility] for slurred speech and dysphagia. Per neurologist, DDx includes Frontotemporal dementia, PSP. 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 Recently relocated to [facility]. Patient had gradual speech changes beginning in 2020. This has been progressive, and patient began to notice difficulty with swallowing.  MBS performed 10/26/2020 with recommendation for regular/thin diet with aspiration precautions; residue was noted in the valleculae, pyriforms, and posterior pharyngeal wall.      Dysphagia Treatment   Respiratory Status Room air    Oral Cavity - Dentition Adequate natural dentition    Treatment Methods Patient/caregiver education    Patient observed directly with PO's Yes    Type of PO's observed Thin  liquids    Feeding Able to feed self    Liquids provided via Straw    Other treatment/comments Session focus on symptom management for dysphagia. Pt reports drooling more frequently when upright; slight anterior escape noted x2 today in session when talking. Educated that labial/facial weakness contributes to decreased management of saliva. Encouraged pt to discuss with neurologist re: medical management of this. Suggested using lemon-flavored sugar-free lozenges to promote containment, more frequent swallowing. Pt reports symptoms with peanuts, rice, lettuce, apple peels and corn. He coughs when eating ~2x per week. Educated on texture modifications (he is now peeling apples), moistening solids and avoiding troublesome foods. Provided cough journal for pt to track symptoms. Introduced Teacher, adult education of message banking and text-to-speech apps for communication. Will discuss in future sessions pending pt interest/readiness.      Assessment / Recommendations / Plan   Plan Goals updated   Patient would like to attend 1x every other week.     Dysphagia Recommendations   Liquids provided via Cup    Medication Administration Whole meds with liquid    Supervision Patient able to self feed    Compensations Slow rate;Small sips/bites;Follow solids with liquid      Progression Toward Goals   Progression toward goals Progressing toward goals              SLP Education - 02/27/21 1359     Education Details symptom  management for dysphagia    Person(s) Educated Patient;Spouse    Methods Explanation    Comprehension Verbalized understanding              SLP Short Term Goals - 12/26/20 1652       SLP SHORT TERM GOAL #1   Title Patient will demonstrate dysarthria HEP with modified independence.    Time 10    Period --   visits   Status On-going      SLP SHORT TERM GOAL #2   Title Patient will use compensations for dysarthria for paragraph reading level 90% accuracy.    Time 10    Period  Weeks    Status On-going      SLP SHORT TERM GOAL #3   Title Patient will complete clinical swallowing assessment with goals added PRN    Time 2    Period Weeks    Status Achieved              SLP Long Term Goals - 12/26/20 1653       SLP LONG TERM GOAL #1   Title Patient will use compensations for dysarthria in 15 minutes mod complex conversation for greater than 95% intelligibility.    Time 12    Period Weeks    Status On-going      SLP LONG TERM GOAL #2   Title Patient will verbalize signs/symptoms of aspiration and aspiration pneumonia.    Time 12    Period Weeks    Status On-going      SLP LONG TERM GOAL #3   Title Patient will demonstrate knowledge of external aids/apps for augmentative/alternative communication    Time 12    Period Weeks    Status On-going              Plan - 02/27/21 1359     Clinical Impression Statement Patient presents with moderate mixed flaccid-spastic dysarthria characterized by slow rate, weak, imprecise consonants (linguals, plosives), wet/gravelly vocal quality, excess/equal stress and mild hypernasality in connected speech. Presence of lingual atrophy, lingual fasciculations, lingual and labial weakness, decreased range of motion bilaterally. Reports drooling more frequently; has been having difficulty with hulled vegetables, fruit peels, rice, nuts. Recently evaluated by neurology at [facility], has EMG pending as well as referral to [facility]. 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    Speech Therapy Frequency 1x /week    Duration 12 weeks    Treatment/Interventions Aspiration precaution training;Cueing hierarchy;SLP instruction and feedback;Pharyngeal strengthening exercises;Compensatory techniques;Functional tasks;Compensatory strategies;Diet toleration management by  SLP;Trials of upgraded texture/liquids;Multimodal communcation approach;Internal/external aids;Patient/family education    Potential to Achieve Goals Fair    Potential Considerations Medical prognosis   suspect progressive neurologic etiology   Consulted and Agree with Plan of Care Patient;Family member/caregiver    Family Member Consulted wife             Patient will benefit from skilled therapeutic intervention in order to improve the following deficits and impairments:   Dysphagia, oropharyngeal phase  Dysarthria and anarthria    Problem List There are no problems to display for this patient.  Deneise Lever, MS, CCC-SLP Speech-Language Pathologist  Aliene Altes, [place] 02/27/2021, 2:01 PM  [place] MAIN [name] SERVICES [address] [place], [place], [address] Phone: [phone]   Fax:  [phone]   Name: Donald Gould MRN: [id number] Date of Birth:  06/01/1950 ° °

## 2021-03-06 ENCOUNTER — Encounter: Payer: Medicare Other | Admitting: Speech Pathology

## 2021-03-13 ENCOUNTER — Other Ambulatory Visit: Payer: Self-pay

## 2021-03-13 ENCOUNTER — Ambulatory Visit: Payer: Medicare Other | Admitting: Speech Pathology

## 2021-03-13 DIAGNOSIS — R1312 Dysphagia, oropharyngeal phase: Secondary | ICD-10-CM | POA: Diagnosis not present

## 2021-03-13 DIAGNOSIS — R471 Dysarthria and anarthria: Secondary | ICD-10-CM

## 2021-03-13 NOTE — Therapy (Signed)
[place] MAIN [facility] SERVICES [address] [place], [place], [address] Phone: [phone]   Fax:  [phone]  Speech Language Pathology Treatment and Recertification  Patient Details  Name: Donald Gould MRN: [id number] Date of Birth: [birth year] Referring Provider (SLP): Silvestre Moment, MD   Encounter Date: 03/13/2021   End of Session - 03/13/21 1639     Visit Number 8    Number of Visits 12    Date for SLP Re-Evaluation 06/11/21    SLP Start Time 1500    SLP Stop Time  1535    SLP Time Calculation (min) 35 min    Activity Tolerance Patient tolerated treatment well             No past medical history on file.  No past surgical history on file.  There were no vitals filed for this visit.   Subjective Assessment - 03/13/21 1628     Subjective Had EMG but has not discussed yet with neurology    Patient is accompained by: Family member   spouse   Currently in Pain? No/denies                   ADULT SLP TREATMENT - 03/13/21 1629       General Information   Behavior/Cognition Alert;Cooperative;Pleasant mood    HPI Patient is a 70 year old male with past medical history of TIA, elevated PSA, referred by neurologist in [facility] for slurred speech and dysphagia. Per neurologist, DDx includes Frontotemporal dementia, PSP. 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 Recently relocated to [facility]. Patient had gradual speech changes beginning in 2020. This has been progressive, and patient began to notice difficulty with swallowing.  MBS performed 10/26/2020 with recommendation for regular/thin diet with aspiration precautions; residue was noted in the valleculae, pyriforms, and posterior pharyngeal wall.      Dysphagia Treatment   Temperature Spikes Noted No    Respiratory Status Room air    Oral Cavity - Dentition Adequate natural  dentition    Treatment Methods Patient/caregiver education    Patient observed directly with PO's Yes    Type of PO's observed Thin liquids    Feeding Able to feed self    Liquids provided via Cup    Other treatment/comments Patient reports recording one episode in his cough journal; coughed with bread/cracker appetizer brought by a neighbor. Overall feels swallowing is improved; reports he has been more attentive and careful. Purchased some sugar-free lemon drops and feels this is helping cue him to swallow his saliva. Discussed plan of care with pt/spouse. Encouraged pt to follow up with neurology as he has not yet discussed results of EMG. Explained that as pt is adequately managing symptoms at this time, it would be beneficial for him to have further dx from neurology before proceeding with additional treatment.      Assessment / Recommendations / Plan   Plan Other (Comment)   hold pending pt follow-up with neurology to discuss EMG results     Dysphagia Recommendations   Liquids provided via Cup    Medication Administration Whole meds with liquid    Supervision Patient able to self feed    Compensations Slow rate;Small sips/bites;Follow solids with liquid      Progression Toward Goals   Progression toward goals Progressing toward goals              SLP Education - 03/13/21 1639  Education Details call neurology to discuss EMG results    Person(s) Educated Patient;Spouse    Methods Explanation    Comprehension Verbalized understanding              SLP Short Term Goals - 03/13/21 1642       SLP SHORT TERM GOAL #1   Title Patient will demonstrate dysarthria HEP with modified independence.    Time 10    Period --   visits   Status Achieved      SLP SHORT TERM GOAL #2   Title Patient will use compensations for dysarthria for paragraph reading level 90% accuracy.    Time 10    Period Weeks    Status Achieved      SLP SHORT TERM GOAL #3   Title Patient will  complete clinical swallowing assessment with goals added PRN    Time 2    Period Weeks    Status Achieved              SLP Long Term Goals - 03/13/21 1643       SLP LONG TERM GOAL #1   Title Patient will use compensations for dysarthria in 15 minutes mod complex conversation for greater than 95% intelligibility.    Time 12    Period Weeks    Status On-going      SLP LONG TERM GOAL #2   Title Patient will verbalize signs/symptoms of aspiration and aspiration pneumonia.    Time 12    Period Weeks    Status On-going      SLP LONG TERM GOAL #3   Title Patient will demonstrate knowledge of external aids/apps for augmentative/alternative communication    Time 12    Period Weeks    Status On-going              Plan - 03/13/21 1640     Clinical Impression Statement Patient presents with moderate mixed flaccid-spastic dysarthria characterized by slow rate, weak, imprecise consonants (linguals, plosives), wet/gravelly vocal quality, excess/equal stress and mild hypernasality in connected speech. Presence of lingual atrophy, lingual fasciculations, lingual and labial weakness, decreased range of motion bilaterally. Reports drooling more frequently; has been having difficulty with hulled vegetables, fruit peels, rice, nuts. Recently evaluated by neurology at [facility], EMG completed 12/19; per discussion with pt/spouse today, will place on hold pending further discussion/dx with neurology. 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    Speech Therapy Frequency 1x /week    Duration 12 weeks    Treatment/Interventions Aspiration precaution training;Cueing hierarchy;SLP instruction and feedback;Pharyngeal strengthening exercises;Compensatory techniques;Functional tasks;Compensatory strategies;Diet  toleration management by SLP;Trials of upgraded texture/liquids;Multimodal communcation approach;Internal/external aids;Patient/family education    Potential to Achieve Goals Fair    Potential Considerations Medical prognosis   suspect progressive neurologic etiology   Consulted and Agree with Plan of Care Patient;Family member/caregiver    Family Member Consulted wife             Patient will benefit from skilled therapeutic intervention in order to improve the following deficits and impairments:   Dysphagia, oropharyngeal phase  Dysarthria and anarthria    Problem List There are no problems to display for this patient.  Deneise Lever, MS, CCC-SLP Speech-Language Pathologist   Aliene Altes, [place] 03/13/2021, 4:43 PM  [place] MAIN [facility] SERVICES [address] [place], [place], [address]  Phone: [phone]   Fax:  [phone]   Name: Donald Gould MRN: [id number] Date of Birth: 11-Dec-1950

## 2021-03-13 NOTE — Patient Instructions (Signed)
Call Dr. Eusebio Friendly office and find out when you can get back in to discuss your results of your EMG. Once you know the date of your appointment, call Olean Ree [phone] or email [email or web address] and let me know.

## 2021-03-28 ENCOUNTER — Ambulatory Visit: Payer: Medicare Other | Admitting: Speech Pathology

## 2021-04-11 ENCOUNTER — Ambulatory Visit: Payer: Medicare Other | Attending: Neurology | Admitting: Speech Pathology

## 2021-04-11 ENCOUNTER — Other Ambulatory Visit: Payer: Self-pay

## 2021-04-11 DIAGNOSIS — R1312 Dysphagia, oropharyngeal phase: Secondary | ICD-10-CM | POA: Diagnosis present

## 2021-04-11 DIAGNOSIS — R471 Dysarthria and anarthria: Secondary | ICD-10-CM | POA: Insufficient documentation

## 2021-04-11 NOTE — Therapy (Signed)
[place] MAIN [facility] SERVICES [address] [place], [place], [address] Phone: [phone]   Fax:  [phone]  Speech Language Pathology Treatment  Patient Details  Name: Donald Gould MRN: [id number] Date of Birth: 29-Jun-1950 Referring Provider (SLP): Silvestre Moment, MD   Encounter Date: 04/11/2021   End of Session - 04/11/21 1353     Visit Number 9    Number of Visits 12    Date for SLP Re-Evaluation 06/11/21    SLP Start Time 1100    SLP Stop Time  1200    SLP Time Calculation (min) 60 min    Activity Tolerance Patient tolerated treatment well             No past medical history on file.  No past surgical history on file.  There were no vitals filed for this visit.   Subjective Assessment - 04/11/21 1341     Subjective Received ALS diagnosis on 03/27/21.    Patient is accompained by: Family member   wife [name]   Currently in Pain? No/denies                   ADULT SLP TREATMENT - 04/11/21 1342       General Information   Behavior/Cognition Alert;Cooperative;Pleasant mood    HPI Patient is a 71 year old male with past medical history of TIA, elevated PSA, referred by neurologist in [facility] for slurred speech and dysphagia. 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 Patient had gradual speech changes beginning in 2020. This has been progressive, and patient began to notice difficulty with swallowing.  MBS performed 10/26/2020 with recommendation for regular/thin diet with aspiration precautions; residue was noted in the valleculae, pyriforms, and posterior pharyngeal wall.      Dysphagia Treatment   Temperature Spikes Noted No    Respiratory Status Room air    Oral Cavity - Dentition  Adequate natural dentition    Treatment Methods Patient/caregiver education;Skilled observation;Compensation strategy training    Patient observed directly with PO's Yes    Type of PO's observed Thin liquids;Dysphagia 3 (soft)    Feeding Able to feed self    Liquids provided via Cup    Oral Phase Signs & Symptoms Prolonged mastication;Prolonged bolus formation;Other (comment)   oral residue, copious thick/stringy saliva   Pharyngeal Phase Signs & Symptoms Immediate throat clear;Delayed throat clear    Type of cueing Verbal    Amount of cueing Moderate    Other treatment/comments Opened session with pt/spouse opportunity to ask questions relating to swallowing/speech with recent diagnosis of ALS. Pt verbalizes he feels he is managing symptoms well by "being more careful." Pt reports he has been avoiding tough/fibrous foods, is peeling apples/potatoes, using sauce/gravy to moisten foods. Discussed anterior bolus loss; pt observed with munching pattern, prolonged bolus formation when consuming softened solids. Pt required cues for clearing oral cavity prior to taking additional bites, reminders to swallow twice for solids. Intermittent throat clearing which appears protective (vocal quality clear afterwards). Education provided to pt/spouse on managing symptoms of dysphagia related to ALS using energy conservations (smaller, more frequent meals), altering textures to reduce efforts of mastication/ multiple swallows, taking smaller bites/clearing oral cavity prior to taking additional bites for easier bolus control/manipulation. Discussed importance of maintaining adequate nutrition and that feeding tube may be  considered to supplement nutrition in the future if it is difficult for pt to take in adequate calories by mouth. Reviewed signs of aspiration and indications for re-evaluating swallow function should s/sx dysphagia progress.      Assessment / Recommendations / Plan   Plan Other (Comment)   hold  pending pt follow-up with neurology to discuss EMG results     Dysphagia Recommendations   Liquids provided via Cup    Medication Administration Whole meds with liquid    Supervision Patient able to self feed    Compensations Slow rate;Small sips/bites;Follow solids with liquid      Progression Toward Goals   Progression toward goals Progressing toward goals              SLP Education - 04/11/21 1353     Education Details managing dysphagia with ALS    Person(s) Educated Patient;Spouse    Methods Explanation;Verbal cues;Handout    Comprehension Verbalized understanding;Verbal cues required;Need further instruction              SLP Short Term Goals - 03/13/21 1642       SLP SHORT TERM GOAL #1   Title Patient will demonstrate dysarthria HEP with modified independence.    Time 10    Period --   visits   Status Achieved      SLP SHORT TERM GOAL #2   Title Patient will use compensations for dysarthria for paragraph reading level 90% accuracy.    Time 10    Period Weeks    Status Achieved      SLP SHORT TERM GOAL #3   Title Patient will complete clinical swallowing assessment with goals added PRN    Time 2    Period Weeks    Status Achieved              SLP Long Term Goals - 03/13/21 1643       SLP LONG TERM GOAL #1   Title Patient will use compensations for dysarthria in 15 minutes mod complex conversation for greater than 95% intelligibility.    Time 12    Period Weeks    Status On-going      SLP LONG TERM GOAL #2   Title Patient will verbalize signs/symptoms of aspiration and aspiration pneumonia.    Time 12    Period Weeks    Status On-going      SLP LONG TERM GOAL #3   Title Patient will demonstrate knowledge of external aids/apps for augmentative/alternative communication    Time 12    Period Weeks    Status On-going              Plan - 04/11/21 1353     Clinical Impression Statement Patient presents with moderate mixed  flaccid-spastic dysarthria characterized by slow rate, weak, imprecise consonants (linguals, plosives), wet/gravelly vocal quality, excess/equal stress and mild hypernasality in connected speech. Presence of lingual atrophy, lingual fasciculations, lingual and labial weakness, decreased range of motion bilaterally. ALS diagnosis confirmed 03/27/21. Pt reports medication for sialorrhea has been helpful in reducing secretions. He has been avoiding troublesome foods including raw/hulled vegetables, fruit peels, rice, nuts. Will continue skilled ST at frequency of 1x every other week with focus on maximizing communicative and swallowing function, including symptom management, compensatory strategies and augmentative/alternative communication.    Speech Therapy Frequency 1x /week   every other week   Duration 12 weeks    Treatment/Interventions Aspiration precaution training;Cueing hierarchy;SLP instruction and feedback;Pharyngeal strengthening exercises;Compensatory techniques;Functional  tasks;Compensatory strategies;Diet toleration management by SLP;Trials of upgraded texture/liquids;Multimodal communcation approach;Internal/external aids;Patient/family education    Potential to Achieve Goals Fair    Potential Considerations Medical prognosis   suspect progressive neurologic etiology   Consulted and Agree with Plan of Care Patient;Family member/caregiver    Family Member Consulted wife             Patient will benefit from skilled therapeutic intervention in order to improve the following deficits and impairments:   Dysphagia, oropharyngeal phase  Dysarthria and anarthria    Problem List There are no problems to display for this patient.  Deneise Lever, MS, CCC-SLP Speech-Language Pathologist  Aliene Altes, [place] 04/11/2021, 1:57 PM  [place] MAIN [facility] SERVICES [address] [place], [place], [address] Phone: [phone]   Fax:   [phone]   Name: Ishmeal Rorie MRN: [id number] Date of Birth: 1950-05-23

## 2021-04-26 ENCOUNTER — Encounter: Payer: Medicare Other | Admitting: Speech Pathology

## 2021-05-10 ENCOUNTER — Ambulatory Visit: Payer: Medicare Other | Attending: Neurology | Admitting: Speech Pathology

## 2021-05-10 ENCOUNTER — Other Ambulatory Visit: Payer: Self-pay

## 2021-05-10 DIAGNOSIS — R1312 Dysphagia, oropharyngeal phase: Secondary | ICD-10-CM | POA: Insufficient documentation

## 2021-05-10 DIAGNOSIS — R471 Dysarthria and anarthria: Secondary | ICD-10-CM | POA: Insufficient documentation

## 2021-05-10 NOTE — Therapy (Signed)
[place] MAIN [facility] SERVICES [address] [place], [place], [address] Phone: [phone]   Fax:  [phone]  Speech Language Pathology Treatment  Patient Details  Name: Donald Gould MRN: [id number] Date of Birth: [birth year] Referring Provider (SLP): Silvestre Moment, MD   Encounter Date: 05/10/2021   End of Session - 05/10/21 1612     Visit Number 10    Number of Visits 12    Date for SLP Re-Evaluation 06/11/21    SLP Start Time 22    SLP Stop Time  1500    SLP Time Calculation (min) 60 min    Activity Tolerance Patient tolerated treatment well             No past medical history on file.  No past surgical history on file.  There were no vitals filed for this visit.   Subjective Assessment - 05/10/21 1509     Subjective "My saliva has gotten worse."    Patient is accompained by: Family member   [name]   Currently in Pain? No/denies                   ADULT SLP TREATMENT - 05/10/21 1510       General Information   Behavior/Cognition Alert;Cooperative;Pleasant mood    HPI Patient is a 71 year old male with past medical history of TIA, elevated PSA, referred by neurologist in [facility] for slurred speech and dysphagia. 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 Patient had gradual speech changes beginning in 2020. This has been progressive, and patient began to notice difficulty with swallowing.  MBS performed 10/26/2020 with recommendation for regular/thin diet with aspiration precautions; residue was noted in the valleculae, pyriforms, and posterior pharyngeal wall.      Dysphagia Treatment   Temperature Spikes Noted No    Respiratory Status Room air    Oral Cavity - Dentition Adequate  natural dentition    Treatment Methods Patient/caregiver education;Skilled observation;Compensation strategy training    Patient observed directly with PO's Yes    Type of PO's observed Thin liquids    Feeding Able to feed self    Liquids provided via --   water bottle   Oral Phase Signs & Symptoms --   occasional anterior secretion loss prior to POs   Pharyngeal Phase Signs & Symptoms --   no overt signs of aspiration or change in vocal quality   Other treatment/comments Continue to educate pt and spouse on swallow physiology, aspiration risks, and swallowing precautions. Pt primarily limiting self to soft (dysphagia 3 textures). Likes to eat nuts with his drink in the evening and reports coughing with almonds. Provided handout with dysphagia 3 textures to help pt choose alternatives to troublesome foods. Reminded pt/spouse to continue filling in cough journal. Pt reports cleaning his mouth after meals. Reviewed swallowing precautions including clearing mouth completely of solids before drinking liquids. Provided education/information on Heimlich maneuver as well as dechoking device to spouse. Spouse with concerns re: pt's protein intake; encouraged her to discuss pt's specific needs during visit with dietitian during [place] [facility] next month. Suggested texture alterations that may make consuming calories easier, such as smoothies, blended soups. Education that repeat MBS may be appropriate if pt exhibits increased signs of aspiration at mealtimes to determine appropriate textures/compensatory strategies. Pt with increased  saliva today; pt benefitted from cues to swallow for secretion management. Pt noticing impacts when he is talking on the phone. Reports he had to end a phone call because, "I didn't have a voice." Introduced idea of using text to speech apps with plans to discuss further in subsequence sessions.      Assessment / Recommendations / Plan   Plan Other (Comment)   hold pending pt follow-up  with neurology to discuss EMG results     Dysphagia Recommendations   Liquids provided via Cup    Medication Administration Whole meds with liquid    Supervision Patient able to self feed    Compensations Slow rate;Small sips/bites;Follow solids with liquid      Progression Toward Goals   Progression toward goals Progressing toward goals              SLP Education - 05/10/21 1611     Education Details aspiration, how ALS impacts swallowing, managing dysphagia as it progresses    Person(s) Educated Patient;Spouse    Methods Explanation    Comprehension Verbalized understanding              SLP Short Term Goals - 03/13/21 1642       SLP SHORT TERM GOAL #1   Title Patient will demonstrate dysarthria HEP with modified independence.    Time 10    Period --   visits   Status Achieved      SLP SHORT TERM GOAL #2   Title Patient will use compensations for dysarthria for paragraph reading level 90% accuracy.    Time 10    Period Weeks    Status Achieved      SLP SHORT TERM GOAL #3   Title Patient will complete clinical swallowing assessment with goals added PRN    Time 2    Period Weeks    Status Achieved              SLP Long Term Goals - 03/13/21 1643       SLP LONG TERM GOAL #1   Title Patient will use compensations for dysarthria in 15 minutes mod complex conversation for greater than 95% intelligibility.    Time 12    Period Weeks    Status On-going      SLP LONG TERM GOAL #2   Title Patient will verbalize signs/symptoms of aspiration and aspiration pneumonia.    Time 12    Period Weeks    Status On-going      SLP LONG TERM GOAL #3   Title Patient will demonstrate knowledge of external aids/apps for augmentative/alternative communication    Time 12    Period Weeks    Status On-going              Plan - 05/10/21 1612     Clinical Impression Statement Patient presents with moderate mixed flaccid-spastic dysarthria characterized by slow  rate, weak, imprecise consonants (linguals, plosives), wet/gravelly vocal quality, excess/equal stress and mild hypernasality in connected speech. Presence of lingual atrophy, lingual fasciculations, lingual and labial weakness, decreased range of motion bilaterally. ALS diagnosis confirmed 03/27/21. Pt reports medication for sialorrhea was initially helpful in reducing secretions, however have increased difficulty since last visit. Encouraged pt/spouse to discuss with MD at [place] clinic next month. Pt requires ongoing education to increase understanding of impacts of his diagnosis on swallowing and communication, as well as for symptom management. Repeat MBS may be appropriate in next 1-2 months. Will continue skilled ST at  frequency of 1x every other week with focus on maximizing communicative and swallowing function, as well as education/training in augmentative/alternative communication.    Speech Therapy Frequency 1x /week   every other week   Duration 12 weeks    Treatment/Interventions Aspiration precaution training;Cueing hierarchy;SLP instruction and feedback;Pharyngeal strengthening exercises;Compensatory techniques;Functional tasks;Compensatory strategies;Diet toleration management by SLP;Trials of upgraded texture/liquids;Multimodal communcation approach;Internal/external aids;Patient/family education    Potential to Achieve Goals Fair    Potential Considerations Medical prognosis   suspect progressive neurologic etiology   Consulted and Agree with Plan of Care Patient;Family member/caregiver    Family Member Consulted wife             Patient will benefit from skilled therapeutic intervention in order to improve the following deficits and impairments:   Dysphagia, oropharyngeal phase  Dysarthria and anarthria    Problem List There are no problems to display for this patient.  Deneise Lever, [place], CCC-SLP Speech-Language Pathologist [phone]  Aliene Altes,  [place] 05/10/2021, 4:19 PM  [place] MAIN [facility] SERVICES [address] [place], [place], [address] Phone: [phone]   Fax:  [phone]   Name: Donald Gould MRN: [id number] Date of Birth: 09/17/1950

## 2021-05-10 NOTE — Patient Instructions (Addendum)
https://www.dechoker.com/  The Dechoker is a life-saving device that can be used for choking first aid on anyone 12 months or older, regardless of illness, disorder or other health-related condition. Requiring minimal training, it can be safely administered and comes with easy step-by-step guidelines for use.

## 2021-05-23 ENCOUNTER — Ambulatory Visit: Payer: Medicare Other | Admitting: Dermatology

## 2021-05-24 ENCOUNTER — Ambulatory Visit: Payer: Medicare Other | Attending: Neurology | Admitting: Speech Pathology

## 2021-05-24 ENCOUNTER — Other Ambulatory Visit: Payer: Self-pay

## 2021-05-24 DIAGNOSIS — R471 Dysarthria and anarthria: Secondary | ICD-10-CM

## 2021-05-24 DIAGNOSIS — R1312 Dysphagia, oropharyngeal phase: Secondary | ICD-10-CM

## 2021-05-24 NOTE — Therapy (Addendum)
[place] [facility] MAIN [facility] SERVICES [address] [place], [place], [address] Phone: [phone]   Fax:  [phone]  Speech Language Pathology Treatment  Patient Details  Name: Donald Gould MRN: [id number] Date of Birth: 23-Aug-1950 Referring Provider (SLP): Dian Queen, MD   Encounter Date: 05/24/2021   End of Session - 05/24/21 1714     Visit Number 11    Number of Visits 12    Date for SLP Re-Evaluation 06/11/21    SLP Start Time 1400    SLP Stop Time  1500    SLP Time Calculation (min) 60 min    Activity Tolerance Patient tolerated treatment well             No past medical history on file.  No past surgical history on file.  There were no vitals filed for this visit.   Subjective Assessment - 05/24/21 1453     Subjective Continues to report frustration with saliva    Patient is accompained by: Family member   Donald Gould   Currently in Pain? No/denies                   ADULT SLP TREATMENT - 05/24/21 1454       General Information   Behavior/Cognition Alert;Cooperative;Pleasant mood    HPI Patient is a 71 year old male with past medical history of TIA, elevated PSA, referred by neurologist in [facility] for slurred speech and dysphagia. 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 Patient had gradual speech changes beginning in 2020. This has been progressive, and patient began to notice difficulty with swallowing.  MBS performed 10/26/2020 with recommendation for regular/thin diet with aspiration precautions; residue was noted in the valleculae, pyriforms, and posterior pharyngeal wall.      Treatment Provided   Treatment provided Dysphagia;Cognitive-Linquistic      Dysphagia Treatment    Temperature Spikes Noted No    Respiratory Status Room air    Oral Cavity - Dentition Adequate natural dentition    Treatment Methods Patient/caregiver education;Skilled observation;Compensation strategy training    Patient observed directly with PO's Yes    Type of PO's observed Thin liquids;Dysphagia 3 (soft);Nectar-thick liquids    Feeding Able to feed self    Liquids provided via Cup   water bottle   Oral Phase Signs & Symptoms --   occasional anterior secretion loss prior to POs   Pharyngeal Phase Signs & Symptoms Immediate cough;Wet vocal quality   x1, after larger bolus   Type of cueing Verbal    Amount of cueing Moderate    Other treatment/comments Patient consumed soft cracker with anterior loss (r>l) x3, primarily saliva. Required cues to swallow before taking additional bites. Unable to sweep tongue laterally for clearance, but able to use index finger to clear lateral sulci with min cues. Pt sipped thin water from water bottle without overt signs of aspiration initially, however later in session when less focused, took a larger sip with immediate cough, wet vocal quality. Introduced concept of use of thickener which may be beneficial for pt in the future to increase comfort/safety with liquids. Pt consumed 4 oz nectar via cup without overt signs of aspiration and reported greater ease with this. Provided handout with information on how to obtain thickener if necessary for increased pt comfort. Pt reports medications  sticking on his tongue; educated on use of pudding/puree to facilitate oral transit. Pt plan for follow up on 07/05/21. Encouraged pt/spouse to contact if worsening dysphagia before that time and consideration of repeat MBS.      Cognitive-Linquistic Treatment   Treatment focused on Dysarthria;Patient/family/caregiver education    Skilled Treatment Educated on communication effectiveness strategies, use of whiteboard/boogie board or pen and paper to clarify any breakdowns. Pt  not interested in any apps at this time. See pt instructions for additional info/patient handout.      Assessment / Recommendations / Plan   Plan Continue with current plan of care      Dysphagia Recommendations   Liquids provided via Cup    Medication Administration Whole meds with liquid    Supervision Patient able to self feed    Compensations Slow rate;Small sips/bites;Follow solids with liquid      Progression Toward Goals   Progression toward goals Progressing toward goals                SLP Short Term Goals - 03/13/21 1642       SLP SHORT TERM GOAL #1   Title Patient will demonstrate dysarthria HEP with modified independence.    Time 10    Period --   visits   Status Achieved      SLP SHORT TERM GOAL #2   Title Patient will use compensations for dysarthria for paragraph reading level 90% accuracy.    Time 10    Period Weeks    Status Achieved      SLP SHORT TERM GOAL #3   Title Patient will complete clinical swallowing assessment with goals added PRN    Time 2    Period Weeks    Status Achieved              SLP Long Term Goals - 03/13/21 1643       SLP LONG TERM GOAL #1   Title Patient will use compensations for dysarthria in 15 minutes mod complex conversation for greater than 95% intelligibility.    Time 12    Period Weeks    Status On-going      SLP LONG TERM GOAL #2   Title Patient will verbalize signs/symptoms of aspiration and aspiration pneumonia.    Time 12    Period Weeks    Status On-going      SLP LONG TERM GOAL #3   Title Patient will demonstrate knowledge of external aids/apps for augmentative/alternative communication    Time 12    Period Weeks    Status On-going              Plan - 05/24/21 1714     Clinical Impression Statement Patient presents with moderate mixed flaccid-spastic dysarthria characterized by slow rate, weak, imprecise consonants (linguals, plosives), wet/gravelly vocal quality, excess/equal stress and  mild hypernasality in connected speech. Presence of lingual atrophy, lingual fasciculations, lingual and labial weakness, decreased range of motion bilaterally. ALS diagnosis confirmed 03/27/21. Pt reports medication for sialorrhea was initially helpful in reducing secretions, however have increased difficulty in last 2 visits. Encouraged pt/spouse to discuss with MD at ALS clinic later this month. Pt requires ongoing education to increase understanding of impacts of his diagnosis on swallowing and communication, as well as for symptom management. Repeat MBS may be appropriate in next 1-2 months. Will continue skilled ST at frequency of 1x every other week with focus on maximizing communicative and swallowing function (however next 2 appointments  cancelled per pt request), with next follow up scheduled 07/05/21, as well as education/training in augmentative/alternative communication.    Speech Therapy Frequency 1x /week   every other week   Duration 12 weeks    Treatment/Interventions Aspiration precaution training;Cueing hierarchy;SLP instruction and feedback;Pharyngeal strengthening exercises;Compensatory techniques;Functional tasks;Compensatory strategies;Diet toleration management by SLP;Trials of upgraded texture/liquids;Multimodal communcation approach;Internal/external aids;Patient/family education    Potential to Achieve Goals Fair    Potential Considerations Medical prognosis   suspect progressive neurologic etiology   Consulted and Agree with Plan of Care Patient;Family member/caregiver    Family Member Consulted wife             Patient will benefit from skilled therapeutic intervention in order to improve the following deficits and impairments:   Dysphagia, oropharyngeal phase  Dysarthria and anarthria    Problem List There are no problems to display for this patient.  Rondel Baton, [place], CCC-SLP Speech-Language Pathologist [phone]  Arlana Lindau, CCC-SLP 05/24/2021,  5:16 PM  [place] [facility] MAIN [facility] SERVICES [address] [place], [place], [address] Phone: [phone]   Fax:  [phone]   Name: Donald Gould MRN: [id number] Date of Birth: [birth year]

## 2021-05-24 NOTE — Patient Instructions (Addendum)
If you have a pill that's sticking in your mouth, try taking it with pudding or applesauce or yogurt ? ?[employer] vanilla pudding was the pudding you liked :) ? ?Today we talked about the potential for using thickener, a powder you can add to your drinks to give them more body and help them move slower. I don't think it's necessary for you to add thickener to your drinks right now, but if you start having more trouble with swallowing liquids, we might consider this. See below for the types you can buy at [facility].  ? ? ? ?Communication ?- Try to communicate face-to-face, in the same room ?-Get [name]'s first before you start to talk ?-[name] can remind you to swallow if you say something and she doesn't understand ?-To reduce frustration, it might be helpful for [facility] to repeat back what she did understand, and let you repeat the part she didn't hear ?-You can also try writing down the words that didn't sound clear to the other person on a piece of paper or a "boogie board" (writing tablet) ? ?I'll see you back on 4/20 at 2:00. If you want to come in before that, call me [phone] or the front desk [phone].  ?

## 2021-06-04 ENCOUNTER — Ambulatory Visit: Payer: Medicare Other | Admitting: Dermatology

## 2021-06-07 ENCOUNTER — Encounter: Payer: Medicare Other | Admitting: Speech Pathology

## 2021-06-21 ENCOUNTER — Encounter: Payer: Medicare Other | Admitting: Speech Pathology

## 2021-06-27 ENCOUNTER — Encounter: Payer: Self-pay | Admitting: Ophthalmology

## 2021-06-28 NOTE — Discharge Instructions (Signed)

## 2021-07-02 ENCOUNTER — Other Ambulatory Visit: Payer: Self-pay

## 2021-07-02 ENCOUNTER — Encounter: Payer: Self-pay | Admitting: Ophthalmology

## 2021-07-02 ENCOUNTER — Ambulatory Visit
Admission: RE | Admit: 2021-07-02 | Discharge: 2021-07-02 | Disposition: A | Discharge status: Home / Self Care | Payer: Medicare Other | Attending: Ophthalmology | Admitting: Ophthalmology

## 2021-07-02 ENCOUNTER — Ambulatory Visit: Payer: Medicare Other | Admitting: Anesthesiology

## 2021-07-02 ENCOUNTER — Encounter
Admission: RE | Disposition: A | Discharge status: Home / Self Care | Payer: Self-pay | Source: Home / Self Care | Attending: Ophthalmology

## 2021-07-02 DIAGNOSIS — N400 Enlarged prostate without lower urinary tract symptoms: Secondary | ICD-10-CM | POA: Diagnosis not present

## 2021-07-02 DIAGNOSIS — H2181 Floppy iris syndrome: Secondary | ICD-10-CM | POA: Diagnosis not present

## 2021-07-02 DIAGNOSIS — H2511 Age-related nuclear cataract, right eye: Secondary | ICD-10-CM | POA: Insufficient documentation

## 2021-07-02 DIAGNOSIS — Z87891 Personal history of nicotine dependence: Secondary | ICD-10-CM | POA: Insufficient documentation

## 2021-07-02 DIAGNOSIS — G1221 Amyotrophic lateral sclerosis: Secondary | ICD-10-CM | POA: Insufficient documentation

## 2021-07-02 HISTORY — DX: Amyotrophic lateral sclerosis: G12.21

## 2021-07-02 HISTORY — DX: Other allergy status, other than to drugs and biological substances: Z91.09

## 2021-07-02 HISTORY — PX: CATARACT EXTRACTION W/PHACO: SHX586

## 2021-07-02 SURGERY — PHACOEMULSIFICATION, CATARACT, WITH IOL INSERTION
Anesthesia: Monitor Anesthesia Care | Site: Eye | Laterality: Right

## 2021-07-02 MED ORDER — ARMC OPHTHALMIC DILATING DROPS
1.0000 "application " | OPHTHALMIC | Status: DC | PRN
  Administered 2021-07-02 (×3): 1 via OPHTHALMIC

## 2021-07-02 MED ORDER — DEXMEDETOMIDINE (PRECEDEX) IN NS 20 MCG/5ML (4 MCG/ML) IV SYRINGE
PREFILLED_SYRINGE | INTRAVENOUS | Status: DC | PRN
  Administered 2021-07-02: 10 ug via INTRAVENOUS

## 2021-07-02 MED ORDER — LACTATED RINGERS IV SOLN: INTRAVENOUS | Status: DC

## 2021-07-02 MED ORDER — SIGHTPATH DOSE#1 NA HYALUR & NA CHOND-NA HYALUR IO KIT
PACK | INTRAOCULAR | Status: DC | PRN
  Administered 2021-07-02: 1 via OPHTHALMIC

## 2021-07-02 MED ORDER — BRIMONIDINE TARTRATE-TIMOLOL 0.2-0.5 % OP SOLN
OPHTHALMIC | Status: DC | PRN
  Administered 2021-07-02: 1 [drp] via OPHTHALMIC

## 2021-07-02 MED ORDER — SIGHTPATH DOSE#1 BSS IO SOLN
INTRAOCULAR | Status: DC | PRN
  Administered 2021-07-02: 1 mL

## 2021-07-02 MED ORDER — FENTANYL CITRATE (PF) 100 MCG/2ML IJ SOLN
INTRAMUSCULAR | Status: DC | PRN
  Administered 2021-07-02: 50 ug via INTRAVENOUS

## 2021-07-02 MED ORDER — SIGHTPATH DOSE#1 BSS IO SOLN
INTRAOCULAR | Status: DC | PRN
  Administered 2021-07-02: 64 mL via OPHTHALMIC

## 2021-07-02 MED ORDER — MIDAZOLAM HCL 2 MG/2ML IJ SOLN
INTRAMUSCULAR | Status: DC | PRN
  Administered 2021-07-02: .5 mg via INTRAVENOUS

## 2021-07-02 MED ORDER — SIGHTPATH DOSE#1 BSS IO SOLN
INTRAOCULAR | Status: DC | PRN
  Administered 2021-07-02: 15 mL

## 2021-07-02 MED ORDER — TETRACAINE HCL 0.5 % OP SOLN
1.0000 [drp] | OPHTHALMIC | Status: DC | PRN
  Administered 2021-07-02 (×3): 1 [drp] via OPHTHALMIC

## 2021-07-02 MED ORDER — CEFUROXIME OPHTHALMIC INJECTION 1 MG/0.1 ML
INJECTION | OPHTHALMIC | Status: DC | PRN
  Administered 2021-07-02: 0.1 mL via INTRACAMERAL

## 2021-07-02 SURGICAL SUPPLY — 21 items
CANNULA ANT/CHMB 27G (MISCELLANEOUS) IMPLANT
CANNULA ANT/CHMB 27GA (MISCELLANEOUS) IMPLANT
CATARACT SUITE SIGHTPATH (MISCELLANEOUS) ×2 IMPLANT
FEE CATARACT SUITE SIGHTPATH (MISCELLANEOUS) ×1 IMPLANT
GLOVE SRG 8 PF TXTR STRL LF DI (GLOVE) ×1 IMPLANT
GLOVE SURG ENC TEXT LTX SZ7.5 (GLOVE) ×2 IMPLANT
GLOVE SURG GAMMEX PI TX LF 7.5 (GLOVE) IMPLANT
GLOVE SURG UNDER POLY LF SZ8 (GLOVE) ×2
LENS IOL TECNIS EYHANCE 19.0 (Intraocular Lens) ×1 IMPLANT
NDL FILTER BLUNT 18X1 1/2 (NEEDLE) ×1 IMPLANT
NDL RETROBULBAR .5 NSTRL (NEEDLE) IMPLANT
NEEDLE FILTER BLUNT 18X 1/2SAF (NEEDLE) ×1
NEEDLE FILTER BLUNT 18X1 1/2 (NEEDLE) ×1 IMPLANT
PACK VIT ANT 23G (MISCELLANEOUS) IMPLANT
RING MALYGIN 7.0 (MISCELLANEOUS) IMPLANT
SUT ETHILON 10-0 CS-B-6CS-B-6 (SUTURE)
SUT VICRYL  9 0 (SUTURE)
SUT VICRYL 9 0 (SUTURE) IMPLANT
SUTURE EHLN 10-0 CS-B-6CS-B-6 (SUTURE) IMPLANT
SYR 3ML LL SCALE MARK (SYRINGE) ×2 IMPLANT
WATER STERILE IRR 250ML POUR (IV SOLUTION) ×2 IMPLANT

## 2021-07-02 NOTE — Transfer of Care (Signed)
Immediate Anesthesia Transfer of Care Note ? ?Patient: Donald Gould ? ?Procedure(s) Performed: CATARACT EXTRACTION PHACO AND INTRAOCULAR LENS PLACEMENT (IOC) RIGHT (Right: Eye) ? ?Patient Location: PACU ? ?Anesthesia Type: MAC ? ?Level of Consciousness: awake, alert  and patient cooperative ? ?Airway and Oxygen Therapy: Patient Spontanous Breathing and Patient connected to supplemental oxygen ? ?Post-op Assessment: Post-op Vital signs reviewed, Patient's Cardiovascular Status Stable, Respiratory Function Stable, Patent Airway and No signs of Nausea or vomiting ? ?Post-op Vital Signs: Reviewed and stable ? ?Complications: No notable events documented. ? ?

## 2021-07-02 NOTE — Op Note (Signed)
LOCATION:  [facility] ?  ?PREOPERATIVE DIAGNOSIS:    Nuclear sclerotic cataract right eye. H25.11 ?  ?POSTOPERATIVE DIAGNOSIS:  Nuclear sclerotic cataract right eye.  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 ?  ?PROCEDURE:  Phacoemusification with posterior chamber intraocular lens placement of the right eye  ? ?ULTRASOUND TIME: Procedure(s) with comments: ?CATARACT EXTRACTION PHACO AND INTRAOCULAR LENS PLACEMENT (IOC) RIGHT (Right) - 11.84 ?1:15.6 ? ?LENS:   ?Implant Name Type Inv. Item Serial No. Manufacturer Lot No. LRB No. Used Action  ?LENS IOL TECNIS EYHANCE 19.0 - [id number] Intraocular Lens LENS IOL TECNIS EYHANCE 19.0 [id number] [employer]  Right 1 Implanted  ?   ?  ?  ?SURGEON:  Wyonia Hough, MD ?  ?ANESTHESIA:  Topical with tetracaine drops and 2% Xylocaine jelly, augmented with 1% preservative-free intracameral lidocaine. ? ?  ?COMPLICATIONS:  None. ?  ?DESCRIPTION OF PROCEDURE:  The patient was identified in the holding room and transported to the operating room and placed in the supine position under the operating microscope.  The right eye was identified as the operative eye and it was prepped and draped in the usual sterile ophthalmic fashion. ?  ?A 1 millimeter clear-corneal paracentesis was made at the 12:00 position.  0.5 ml of preservative-free 1% lidocaine was injected into the anterior chamber. ?The anterior chamber was filled with Viscoat viscoelastic.  A 2.4 millimeter keratome was used to make a near-clear corneal incision at the 9:00 position.  A curvilinear capsulorrhexis was made with a cystotome and capsulorrhexis forceps.  Balanced salt solution was used to hydrodissect and hydrodelineate the nucleus. ?  ?Phacoemulsification was then used in stop and chop fashion to remove the lens nucleus and epinucleus. Floppy iris was present. The remaining cortex was then removed using the irrigation and aspiration handpiece. Provisc was then placed into the capsular bag  to distend it for lens placement.  A lens was then injected into the capsular bag.  The remaining viscoelastic was aspirated. ?  ?Wounds were hydrated with balanced salt solution.  The anterior chamber was inflated to a physiologic pressure with balanced salt solution.  No wound leaks were noted. Cefuroxime 0.1 ml of a '10mg'$ /ml solution was injected into the anterior chamber for a dose of 1 mg of intracameral antibiotic at the completion of the case. ?  Timolol and Brimonidine drops were applied to the eye.  The patient was taken to the recovery room in stable condition without complications of anesthesia or surgery. ? ? ?Donald Gould ?07/02/2021, 12:00 PM ? ?

## 2021-07-02 NOTE — Anesthesia Procedure Notes (Signed)
Procedure Name: [place] ?Date/Time: 07/02/2021 11:45 AM ?Performed by: Jeannene Patella, CRNA ?Pre-anesthesia Checklist: Patient identified, Emergency Drugs available, Suction available, Timeout performed and Patient being monitored ?Patient Re-evaluated:Patient Re-evaluated prior to induction ?Oxygen Delivery Method: Nasal cannula ?Placement Confirmation: positive ETCO2 ? ? ? ? ?

## 2021-07-02 NOTE — Anesthesia Preprocedure Evaluation (Signed)
Anesthesia Evaluation  ?Patient identified by MRN, date of birth, ID band ?Patient awake ? ? ? ?Reviewed: ?NPO status  ? ?History of Anesthesia Complications ?Negative for: history of anesthetic complications ? ?Airway ?Mallampati: II ? ?TM Distance: >3 FB ?Neck ROM: full ? ? ? Dental ?no notable dental hx. ? ?  ?Pulmonary ?neg pulmonary ROS, former smoker,  ?  ?Pulmonary exam normal ? ? ? ? ? ? ? Cardiovascular ?Exercise Tolerance: Good ?negative cardio ROS ?Normal cardiovascular exam ? ? ?  ?Neuro/Psych ?neuro: Ronne Binning, MD at 06/12/2021 ; ? ?ALS > salivation,  swallowing difficulty; speech impairment; ?negative psych ROS  ? GI/Hepatic ?negative GI ROS, Neg liver ROS,   ?Endo/Other  ?negative endocrine ROS ? Renal/GU ?negative Renal ROS  ? ?bph ? ?  ?Musculoskeletal ? ? Abdominal ?  ?Peds ? Hematology ?negative hematology ROS ?(+)   ?Anesthesia Other Findings ? ? Reproductive/Obstetrics ? ?  ? ? ? ? ? ? ? ? ? ? ? ? ? ?  ?  ? ? ? ? ? ? ? ? ?Anesthesia Physical ?Anesthesia Plan ? ?ASA: 3 ? ?Anesthesia Plan: MAC  ? ?Post-op Pain Management:   ? ?Induction:  ? ?PONV Risk Score and Plan: 1 and TIVA ? ?Airway Management Planned:  ? ?Additional Equipment:  ? ?Intra-op Plan:  ? ?Post-operative Plan:  ? ?Informed Consent: I have reviewed the patients History and Physical, chart, labs and discussed the procedure including the risks, benefits and alternatives for the proposed anesthesia with the patient or authorized representative who has indicated his/her understanding and acceptance.  ? ? ? ? ? ?Plan Discussed with: CRNA ? ?Anesthesia Plan Comments:   ? ? ? ? ? ? ?Anesthesia Quick Evaluation ? ?

## 2021-07-02 NOTE — H&P (Signed)
?[facility]  ? ?Primary Care Physician:  Derinda Late, MD ?Ophthalmologist: Dr. Leandrew Koyanagi ? ?Pre-Procedure History & Physical: ?HPI:  Donald Gould is a 71 y.o. male here for ophthalmic surgery. ?  ?Past Medical History:  ?Diagnosis Date  ? 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 Environmental allergies   ? ? ?Past Surgical History:  ?Procedure Laterality Date  ? RETINAL DETACHMENT SURGERY Left 01/10/2021  ? [facility]  ? ? ?Prior to Admission medications   ?Medication Sig Start Date End Date Taking? Authorizing Provider  ?ASPIRIN 81 PO Take by mouth daily.   Yes [provider]  ?atropine 1 % ophthalmic solution Place 2 drops under the tongue 3 (three) times daily.   Yes [provider]  ?cetirizine (ZYRTEC) 10 MG tablet Take 10 mg by mouth daily.   Yes [provider]  ?Cholecalciferol (VITAMIN D3) 50 MCG (2000 UT) TABS Take by mouth daily.   Yes [provider]  ?Cyanocobalamin (VITAMIN B-12) 5000 MCG TBDP Take by mouth daily.   Yes [provider]  ?ezetimibe (ZETIA) 10 MG tablet Take 10 mg by mouth daily.   Yes [provider]  ?finasteride (PROSCAR) 5 MG tablet Take 5 mg by mouth daily.   Yes [provider]  ?glycopyrrolate (ROBINUL) 2 MG tablet Take 2 mg by mouth 3 (three) times daily.   Yes [provider]  ?riluzole (RILUTEK) 50 MG tablet Take 50 mg by mouth every 12 (twelve) hours.   Yes [provider]  ?tamsulosin (FLOMAX) 0.4 MG CAPS capsule Take 0.4 mg by mouth daily.   Yes [provider]  ? ? ?Allergies as of 06/26/2021  ? (Not on File)  ? ? ?History reviewed. No pertinent family history. ? ?Social History  ? ?Socioeconomic History  ? Marital status: Married  ?  Spouse name: Not on file  ? Number of children: Not on file  ? Years of education: Not on file  ? Highest education level: Not on file  ?Occupational History  ? Not on file  ?Tobacco Use  ? Smoking status: Former  ?  Types:  Cigarettes  ?  Quit date: 54  ?  Years since quitting: 33.3  ? Smokeless tobacco: Never  ?Vaping Use  ? Vaping Use: Never used  ?Substance and Sexual Activity  ? Alcohol use: Yes  ?  Alcohol/week: 11.0 standard drinks  ?  Types: 11 Standard drinks or equivalent per week  ?  Comment: 1.5 shots/day  ? Drug use: Not on file  ? Sexual activity: Not on file  ?Other Topics Concern  ? Not on file  ?Social History Narrative  ? Not on file  ? ?Social Determinants of Health  ? ?Financial Resource Strain: Not on file  ?Food Insecurity: Not on file  ?Transportation Needs: Not on file  ?Physical Activity: Not on file  ?Stress: Not on file  ?Social Connections: Not on file  ?Intimate Partner Violence: Not on file  ? ? ?Review of Systems: ?See HPI, otherwise negative ROS ? ?Physical Exam: ?BP (!) 148/94   Pulse (!) 56   Temp 98.1 ?F (36.7 ?C) (Temporal)   Resp 18   Ht '6\' 3"'$  (1.905 m)   Wt 93.9 kg   SpO2 99%   BMI 25.87 kg/m?  ?General:   Alert,  pleasant and cooperative in NAD ?Head:  Normocephalic and atraumatic. ?Lungs:  Clear to auscultation.    ?Heart:  Regular rate and rhythm.  ? ?Impression/Plan: ?Donald Gould  is here for ophthalmic surgery. ? ?Risks, benefits, limitations, and alternatives regarding ophthalmic surgery have been reviewed with the patient.  Questions have been answered.  All parties agreeable. ? ? Leandrew Koyanagi, MD  07/02/2021, 11:18 AM ? ?

## 2021-07-02 NOTE — Anesthesia Postprocedure Evaluation (Signed)
Anesthesia Post Note ? ?Patient: Donald Gould ? ?Procedure(s) Performed: CATARACT EXTRACTION PHACO AND INTRAOCULAR LENS PLACEMENT (IOC) RIGHT (Right: Eye) ? ? ?  ?Patient location during evaluation: PACU ?Anesthesia Type: MAC ?Level of consciousness: awake and alert ?Pain management: pain level controlled ?Vital Signs Assessment: post-procedure vital signs reviewed and stable ?Respiratory status: spontaneous breathing, nonlabored ventilation, respiratory function stable and patient connected to nasal cannula oxygen ?Cardiovascular status: stable and blood pressure returned to baseline ?Postop Assessment: no apparent nausea or vomiting ?Anesthetic complications: no ? ? ?No notable events documented. ? ?Fidel Levy ? ? ? ? ? ?

## 2021-07-03 ENCOUNTER — Encounter: Payer: Self-pay | Admitting: Ophthalmology

## 2021-07-05 ENCOUNTER — Ambulatory Visit: Payer: Medicare Other | Attending: Neurology | Admitting: Speech Pathology

## 2021-07-05 DIAGNOSIS — R471 Dysarthria and anarthria: Secondary | ICD-10-CM

## 2021-07-05 DIAGNOSIS — R1312 Dysphagia, oropharyngeal phase: Secondary | ICD-10-CM | POA: Diagnosis present

## 2021-07-05 NOTE — Patient Instructions (Addendum)
When you go back to [facility], ask the respiratory therapist or the speech therapist if they would recommend using an at-home oral suction device.  ? ?Try not to "throw" those pills down your throat. Put it whole in a spoonful of pudding.  ? ?Speech: Take the time to swallow. If saliva is coming out of your mouth, or if someone asks you to repeat yourself, STOP and swallow.  ? ?Bring your respiratory trainers (blowing and sucking) next time you come on May 4th.  ?

## 2021-07-05 NOTE — Therapy (Signed)
[place] [facility] ?[place], [place], [address] ?Phone: [phone]   Fax:  [phone] ? ?Speech Language Pathology Treatment ? ?Patient Details  ?Name: Donald Gould ?MRN: [id number] ?Date of Birth: 30-Dec-1950 ?Referring Provider (SLP): Silvestre Moment, MD ? ? ?Encounter Date: 07/05/2021 ? ? End of Session - 07/05/21 1501   ? ? Visit Number 12   ? Number of Visits 12   ? Date for SLP Re-Evaluation 06/11/21   ? SLP Start Time 1400   ? SLP Stop Time  1450   ? SLP Time Calculation (min) 50 min   ? Activity Tolerance Patient tolerated treatment well   ? ?  ?  ? ?  ? ? ?Past Medical History:  ?Diagnosis Date  ? 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 Environmental allergies   ? ? ?Past Surgical History:  ?Procedure Laterality Date  ? CATARACT EXTRACTION W/PHACO Right 07/02/2021  ? Procedure: CATARACT EXTRACTION PHACO AND INTRAOCULAR LENS PLACEMENT ([place]) RIGHT;  Surgeon: Leandrew Koyanagi, MD;  Location: [facility];  Service: Ophthalmology;  Laterality: Right;  11.84 ?1:15.6  ? RETINAL DETACHMENT SURGERY Left 01/10/2021  ? [facility]  ? ? ?There were no vitals filed for this visit. ? ? Subjective Assessment - 07/05/21 1452   ? ? Subjective Went to [facility] last month   ? Currently in Pain? No/denies   ? ?  ?  ? ?  ? ? ? ? ? ? ? ? ADULT SLP TREATMENT - 07/05/21 1453   ? ?  ? General Information  ? Behavior/Cognition Alert;Cooperative;Pleasant mood   ? HPI Patient is a 71 year old male with past medical history of TIA, elevated PSA, referred by neurologist in [facility] for slurred speech and dysphagia. 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 Patient had gradual speech changes beginning in 2020. This has been  progressive, and patient began to notice difficulty with swallowing.  MBS performed 10/26/2020 with recommendation for regular/thin diet with aspiration precautions; residue was noted in the valleculae, pyriforms, and posterior pharyngeal wall.   ?  ? Treatment Provided  ? Treatment provided Dysphagia;Cognitive-Linquistic   ?  ? Dysphagia Treatment  ? Temperature Spikes Noted No   ? Respiratory Status Room air   ? Oral Cavity - Dentition Adequate natural dentition   ? Treatment Methods Patient/caregiver education;Skilled observation;Compensation strategy training   ? Patient observed directly with PO's Yes   ? Type of PO's observed Thin liquids   ? Feeding Able to feed self   ? Liquids provided via Cup   water bottle  ? Oral Phase Signs & Symptoms Prolonged bolus formation   anterior secretion loss  ? Pharyngeal Phase Signs & Symptoms --   none noted  ? Type of cueing Verbal   ? Amount of cueing Modified independent   ? Other treatment/comments Pt has consult next week with surgeon re: feeding tube placement. Reports he is adding extra gravy/sauce to make meats easier to swallow. Reports difficulty with oral transit of pills: states he "throws them" to the back of his mouth. Education on using pudding for easier, safer transit. Pt reports he will attempt this evening. MEP/MIP were assessed at [facility], spouse not sure if pt completing correctly. Encouraged pt to bring to next session.   ?  ?  Cognitive-Linquistic Treatment  ? Treatment focused on Dysarthria;Patient/family/caregiver education   ? Skilled Treatment Pt with frequent anterior secretion loss when conversing with SLP. Required occasional question cues to clarify unintelligible speech, as well as reminders to slow rate and swallow saliva (mod cues). When questioned, pt reports he is interested in voice banking but he hasn't yet reviewed pamphlet he received about this. Encouraged pt to use his ALS card ("for the police,") not just if he is pulled over but when  communication breakdowns occur with others. May benefit from additional training with first-letter cuing, use of alphabet board to clarify message when breakdowns occur.   ?  ? Assessment / Recommendations / Plan  ? Plan Other (Comment)   hold pending pt follow-up with neurology to discuss EMG results  ?  ? Dysphagia Recommendations  ? Liquids provided via Cup   ? Medication Administration Whole meds with liquid   ? Supervision Patient able to self feed   ? Compensations Slow rate;Small sips/bites;Follow solids with liquid   ?  ? Progression Toward Goals  ? Progression toward goals Progressing toward goals   ? ?  ?  ? ?  ? ? ? SLP Education - 07/05/21 1500   ? ? Education Details strategies for swallowing pills   ? Person(s) Educated Patient;Spouse   ? Methods Explanation   ? Comprehension Verbalized understanding   ? ?  ?  ? ?  ? ? ? SLP Short Term Goals - 03/13/21 1642   ? ?  ? SLP SHORT TERM GOAL #1  ? Title Patient will demonstrate dysarthria HEP with modified independence.   ? Time 10   ? Period --   visits  ? Status Achieved   ?  ? SLP SHORT TERM GOAL #2  ? Title Patient will use compensations for dysarthria for paragraph reading level 90% accuracy.   ? Time 10   ? Period Weeks   ? Status Achieved   ?  ? SLP SHORT TERM GOAL #3  ? Title Patient will complete clinical swallowing assessment with goals added PRN   ? Time 2   ? Period Weeks   ? Status Achieved   ? ?  ?  ? ?  ? ? ? SLP Long Term Goals - 03/13/21 1643   ? ?  ? SLP LONG TERM GOAL #1  ? Title Patient will use compensations for dysarthria in 15 minutes mod complex conversation for greater than 95% intelligibility.   ? Time 12   ? Period Weeks   ? Status On-going   ?  ? SLP LONG TERM GOAL #2  ? Title Patient will verbalize signs/symptoms of aspiration and aspiration pneumonia.   ? Time 12   ? Period Weeks   ? Status On-going   ?  ? SLP LONG TERM GOAL #3  ? Title Patient will demonstrate knowledge of external aids/apps for augmentative/alternative  communication   ? Time 12   ? Period Weeks   ? Status On-going   ? ?  ?  ? ?  ? ? ? Plan - 07/05/21 1501   ? ? 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 Continues to report frustration with sialorrhea; may benefit from home oral suction. Encouraged pt/spouse to discuss with MD next visit. Pt requires ongoing education to increase understanding of impacts of his diagnosis on swallowing and communication, as well as for symptom management. Repeat MBS may be appropriate in next 1-2 months.  Will continue skilled ST at frequency of 1x every other week with focus on maximizing communicative and swallowing function, as well as education/training in augmentative/alternative communication.   ? Speech Therapy Frequency 1x /week   every other week  ? Duration 12 weeks   ? Treatment/Interventions Aspiration precaution training;Cueing hierarchy;SLP instruction and feedback;Pharyngeal strengthening exercises;Compensatory techniques;Functional tasks;Compensatory strategies;Diet toleration management by SLP;Trials of upgraded texture/liquids;Multimodal communcation approach;Internal/external aids;Patient/family education   ? Potential to [place]   ? Potential Considerations Medical prognosis   suspect progressive neurologic etiology  ? Consulted and Agree with Plan of Care Patient;Family member/caregiver   ? Family Member Consulted wife   ? ?  ?  ? ?  ? ? ?Patient will benefit from skilled therapeutic intervention in order to improve the following deficits and impairments:   ?Dysarthria and anarthria ? ?Dysphagia, oropharyngeal phase ? ? ? ?Problem List ?There are no problems to display for this patient. ? ?Deneise Lever, MS, CCC-SLP ?Speech-Language Pathologist ?[phone] ? ?Aliene Altes, CCC-SLP ?07/05/2021, 3:04 PM ? ?[place] ?[place], [place],  [address] ?Phone: [phone]   Fax:  [phone] ? ? ?Name: Donald Gould ?MRN: [id number] ?Date of Birth: 30-Nov-1950 ? ?

## 2021-07-19 ENCOUNTER — Ambulatory Visit: Payer: Medicare Other | Attending: Neurology | Admitting: Speech Pathology

## 2021-07-19 DIAGNOSIS — R1312 Dysphagia, oropharyngeal phase: Secondary | ICD-10-CM | POA: Diagnosis present

## 2021-07-19 DIAGNOSIS — R471 Dysarthria and anarthria: Secondary | ICD-10-CM | POA: Diagnosis present

## 2021-07-19 NOTE — Therapy (Signed)
Donald ?[facility] ?[place], [place], [address] ?Phone: [phone]   Fax:  [phone] ? ?Speech Language Pathology Treatment and Recertification ? ?Patient Details  ?Name: Donald Gould ?MRN: [id number] ?Date of Birth: 1950/06/21 ?Referring Provider (SLP): Silvestre Moment, MD ? ? ?Encounter Date: 07/19/2021 ? ? End of Session - 07/19/21 1540   ? ? Visit Number 13   ? Number of Visits 24   ? Date for SLP Re-Evaluation 10/02/21   ? SLP Start Time 1400   ? SLP Stop Time  1500   ? SLP Time Calculation (min) 60 min   ? Activity Tolerance Patient tolerated treatment well   ? ?  ?  ? ?  ? ? ?Past Medical History:  ?Diagnosis Date  ? 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 Environmental allergies   ? ? ?Past Surgical History:  ?Procedure Laterality Date  ? CATARACT EXTRACTION W/PHACO Right 07/02/2021  ? Procedure: CATARACT EXTRACTION PHACO AND INTRAOCULAR LENS PLACEMENT ([place]) RIGHT;  Surgeon: Leandrew Koyanagi, MD;  Location: [place] [facility];  Service: Ophthalmology;  Laterality: Right;  11.84 ?1:15.6  ? RETINAL DETACHMENT SURGERY Left 01/10/2021  ? [facility]  ? ? ?There were no vitals filed for this visit. ? ? Subjective Assessment - 07/19/21 1530   ? ? Subjective Pt reports some GI symptoms he attributes to new medication (Relyvrio).   ? Patient is accompained by: Family member   [place]  ? Currently in Pain? No/denies   ? ?  ?  ? ?  ? ? ? ? ? ? ? ? ADULT SLP TREATMENT - 07/19/21 1530   ? ?  ? General Information  ? Behavior/Cognition Alert;Cooperative;Pleasant mood   ? HPI Patient is a 71 year old male with past medical history of TIA, elevated PSA, referred by neurologist in [facility] for slurred speech and dysphagia. 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 Patient had gradual speech changes beginning in 2020. This has been progressive, and patient began to notice difficulty with swallowing.  MBS performed 10/26/2020 with recommendation for regular/thin diet with aspiration precautions; residue was noted in the valleculae, pyriforms, and posterior pharyngeal wall.   ?  ? Treatment Provided  ? Treatment provided Dysphagia;Cognitive-Linquistic   ?  ? Dysphagia Treatment  ? Respiratory Status Room air   ? Oral Cavity - Dentition Adequate natural dentition   ? Treatment Methods Patient/caregiver education;Skilled observation;Compensation strategy training   ? Patient observed directly with PO's Yes   ? Type of PO's observed Thin liquids   ? Liquids provided via Cup   water bottle  ? Oral Phase Signs & Symptoms Prolonged bolus formation   anterior secretion loss  ? Pharyngeal Phase Signs & Symptoms --   none noted  ? Type of cueing Verbal   ? Amount of cueing Modified independent   ? Other treatment/comments Feeding tube placement is scheduled in June. Reports medications with pudding have been easier to swallow. Pt, spouse deny any choking episodes, pt did have some difficulty with peanuts yesterday (Talking when eating). Reinforced precautions and refraining from talking while eating. Encouraged pt /spouse to track instances of coughing if they occur with foods/liquids and to contact SLP sooner than next appointment if concern for aspiration/coughing or choking with POs. Pt completed 25 reps EMST and  IMST at settings preset at [facility]. Pt declines use of nose clips. Required moderate cues for technique (too rapid).   ?  ? Cognitive-Linquistic Treatment  ? Treatment focused on Dysarthria;Patient/family/caregiver education   ? Skilled Treatment Intelligibility somewhat deteriorated compared with previous sessions ~70% in conversation with context; demonstrated use of first-letter cuing and writing in conversation to aid listener comprehension.  Continue to provide resources and education on communication supports including AAC.   ?  ? Assessment / Recommendations / Plan  ? Plan Other (Comment)   hold pending pt follow-up with neurology to discuss EMG results  ?  ? Dysphagia Recommendations  ? Liquids provided via Cup   ? Medication Administration Whole meds with liquid   ? Supervision Patient able to self feed   ? Compensations Slow rate;Small sips/bites;Follow solids with liquid   ?  ? Progression Toward Goals  ? Progression toward goals Progressing toward goals   ? ?  ?  ? ?  ? ? ? SLP Education - 07/19/21 1540   ? ? Education Details disease process, planning for communication and swallowing changes   ? Person(s) Educated Patient;Spouse   ? Methods Explanation   ? Comprehension Verbalized understanding   ? ?  ?  ? ?  ? ? ? SLP Short Term Goals - 03/13/21 1642   ? ?  ? SLP SHORT TERM GOAL #1  ? Title Patient will demonstrate dysarthria HEP with modified independence.   ? Time 10   ? Period --   visits  ? Status Achieved   ?  ? SLP SHORT TERM GOAL #2  ? Title Patient will use compensations for dysarthria for paragraph reading level 90% accuracy.   ? Time 10   ? Period Weeks   ? Status Achieved   ?  ? SLP SHORT TERM GOAL #3  ? Title Patient will complete clinical swallowing assessment with goals added PRN   ? Time 2   ? Period Weeks   ? Status Achieved   ? ?  ?  ? ?  ? ? ? SLP Long Term Goals - 03/13/21 1643   ? ?  ? SLP LONG TERM GOAL #1  ? Title Patient will use compensations for dysarthria in 15 minutes mod complex conversation for greater than 95% intelligibility.   ? Time 12   ? Period Weeks   ? Status On-going   ?  ? SLP LONG TERM GOAL #2  ? Title Patient will verbalize signs/symptoms of aspiration and aspiration pneumonia.   ? Time 12   ? Period Weeks   ? Status On-going   ?  ? SLP LONG TERM GOAL #3  ? Title Patient will demonstrate knowledge of external aids/apps for augmentative/alternative communication   ? Time 12   ? Period Weeks   ? Status  On-going   ? ?  ?  ? ?  ? ? ? Plan - 07/19/21 1542   ? ? 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 Continues to report frustration with sialorrhea; may benefit from home oral suction. Encouraged pt/spouse to discuss with MD next visit. Pt requires ongoing education to increase understanding of impacts of his diagnosis on swallowing and communication, as well as for symptom management. Repeat MBS may be appropriate in next 1-2 months. Will continue skilled ST at frequency of 1x every other week with focus on maximizing communicative and swallowing function, as well as education/training in augmentative/alternative communication.   ?  Speech Therapy Frequency 1x /week   every other week  ? Duration 12 weeks   ? Treatment/Interventions Aspiration precaution training;Cueing hierarchy;SLP instruction and feedback;Pharyngeal strengthening exercises;Compensatory techniques;Functional tasks;Compensatory strategies;Diet toleration management by SLP;Trials of upgraded texture/liquids;Multimodal communcation approach;Internal/external aids;Patient/family education   ? Potential to [place]   ? Potential Considerations Medical prognosis   suspect progressive neurologic etiology  ? Consulted and Agree with Plan of Care Patient;Family member/caregiver   ? Family Member Consulted wife   ? ?  ?  ? ?  ? ? ?Patient will benefit from skilled therapeutic intervention in order to improve the following deficits and impairments:   ?Dysarthria and anarthria ? ?Dysphagia, oropharyngeal phase ? ? ? ?Problem List ?There are no problems to display for this patient. ? ?Deneise Lever, MS, CCC-SLP ?Speech-Language Pathologist ?[phone] ?Aliene Altes, CCC-SLP ?07/19/2021, 3:43 PM ? ?[facility] ?[place], [place], [address] ?Phone: [phone]   Fax:  [phone] ? ? ?Name:  Wah Sabic ?MRN: [id number] ?Date of Birth: [birth year] ? ?

## 2021-07-25 ENCOUNTER — Ambulatory Visit (INDEPENDENT_AMBULATORY_CARE_PROVIDER_SITE_OTHER): Payer: Medicare Other | Admitting: Dermatology

## 2021-07-25 DIAGNOSIS — D1801 Hemangioma of skin and subcutaneous tissue: Secondary | ICD-10-CM

## 2021-07-25 DIAGNOSIS — D692 Other nonthrombocytopenic purpura: Secondary | ICD-10-CM

## 2021-07-25 DIAGNOSIS — Z1283 Encounter for screening for malignant neoplasm of skin: Secondary | ICD-10-CM

## 2021-07-25 DIAGNOSIS — D1800 Hemangioma unspecified site: Secondary | ICD-10-CM

## 2021-07-25 DIAGNOSIS — L578 Other skin changes due to chronic exposure to nonionizing radiation: Secondary | ICD-10-CM

## 2021-07-25 DIAGNOSIS — D229 Melanocytic nevi, unspecified: Secondary | ICD-10-CM

## 2021-07-25 DIAGNOSIS — B360 Pityriasis versicolor: Secondary | ICD-10-CM

## 2021-07-25 DIAGNOSIS — L821 Other seborrheic keratosis: Secondary | ICD-10-CM

## 2021-07-25 DIAGNOSIS — L814 Other melanin hyperpigmentation: Secondary | ICD-10-CM

## 2021-07-25 MED ORDER — KETOCONAZOLE 2 % EX SHAM: MEDICATED_SHAMPOO | CUTANEOUS | 11 refills | Status: DC

## 2021-07-25 NOTE — Progress Notes (Signed)
New Patient Visit  Subjective  Donald Gould is a 71 y.o. male who presents for the following: Annual Exam (The patient presents for Total-Body Skin Exam (TBSE) for skin cancer screening and mole check.  The patient has spots, moles and lesions to be evaluated, some may be new or changing and the patient has concerns that these could be cancer. ).  The following portions of the chart were reviewed this encounter and updated as appropriate:   Tobacco  Allergies  Meds  Problems  Med Hx  Surg Hx  Fam Hx     Review of Systems:  No other skin or systemic complaints except as noted in HPI or Assessment and Plan.  Objective  Well appearing patient in no apparent distress; mood and affect are within normal limits.  A full examination was performed including scalp, head, eyes, ears, nose, lips, neck, chest, axillae, abdomen, back, buttocks, bilateral upper extremities, bilateral lower extremities, hands, feet, fingers, toes, fingernails, and toenails. All findings within normal limits unless otherwise noted below.  right upper eyelid Red papules  chest Hyperpigmented patches    Assessment & Plan  Hemangioma of skin right upper eyelid Hemangiomas - Red papules - Discussed benign nature - Observe - Call for any changes   Tinea versicolor chest Tinea versicolor is a chronic recurrent skin rash causing discolored scaly spots most commonly seen on back, chest, and/or shoulders.  It is generally asymptomatic. The rash is due to overgrowth of a common type of yeast present on everyone's skin and it is not contagious.  It tends to flare more in the summer due to increased sweating on trunk.  After rash is treated, the scaliness will resolve, but the discoloration will take longer to return to normal pigmentation. The periodic use of an OTC medicated soap/shampoo with zinc or selenium sulfide can be helpful to prevent yeast overgrowth and recurrence.   Ketoconazole shampoo wash upper  body 3 days a week, after 3 months decrease to once a weekly   Related Medications ketoconazole (NIZORAL) 2 % shampoo Apply to upper body 3 days a week as a body wash, after 2 months decrease to once weekly  Skin cancer screening  Lentigines - Scattered tan macules - Due to sun exposure - Benign-appearing, observe - Recommend daily broad spectrum sunscreen SPF 30+ to sun-exposed areas, reapply every 2 hours as needed. - Call for any changes  Seborrheic Keratoses - Stuck-on, waxy, tan-brown papules and/or plaques  - Benign-appearing - Discussed benign etiology and prognosis. - Observe - Call for any changes  Melanocytic Nevi - Tan-brown and/or pink-flesh-colored symmetric macules and papules - Benign appearing on exam today - Observation - Call clinic for new or changing moles - Recommend daily use of broad spectrum spf 30+ sunscreen to sun-exposed areas.   Hemangiomas - Red papules - Discussed benign nature - Observe - Call for any changes  Actinic Damage - Chronic condition, secondary to cumulative UV/sun exposure - diffuse scaly erythematous macules with underlying dyspigmentation - Recommend daily broad spectrum sunscreen SPF 30+ to sun-exposed areas, reapply every 2 hours as needed.  - Staying in the shade or wearing long sleeves, sun glasses (UVA+UVB protection) and wide brim hats (4-inch brim around the entire circumference of the hat) are also recommended for sun protection.  - Call for new or changing lesions.  Purpura - Chronic; persistent and recurrent.  Treatable, but not curable. Hands  - Violaceous macules and patches - Benign - Related to trauma, age, sun damage  and/or use of blood thinners, chronic use of topical and/or oral steroids - Observe - Can use OTC arnica containing moisturizer such as Dermend Bruise Formula if desired - Call for worsening or other concerns   Skin cancer screening performed today.  Return if symptoms worsen or fail to  improve.  IMarye Round, CMA, am acting as scribe for Sarina Ser, MD .  Documentation: I have reviewed the above documentation for accuracy and completeness, and I agree with the above.  Sarina Ser, MD

## 2021-07-25 NOTE — Patient Instructions (Signed)

## 2021-08-02 ENCOUNTER — Ambulatory Visit: Payer: Medicare Other | Admitting: Speech Pathology

## 2021-08-02 DIAGNOSIS — R471 Dysarthria and anarthria: Secondary | ICD-10-CM | POA: Diagnosis not present

## 2021-08-02 DIAGNOSIS — R1312 Dysphagia, oropharyngeal phase: Secondary | ICD-10-CM

## 2021-08-03 NOTE — Therapy (Signed)
[place] [address] [place], [place], [address] Phone: [phone]   Fax:  [phone]  Speech Language Pathology Treatment  Patient Details  Name: Donald Gould MRN: [id number] Date of Birth: 07-08-1950 Referring Provider (SLP): Silvestre Moment, MD   Encounter Date: 08/02/2021   End of Session - 08/03/21 0800     Visit Number 14    Number of Visits 24    Date for SLP Re-Evaluation 10/02/21    SLP Start Time 1400    SLP Stop Time  1500    SLP Time Calculation (min) 60 min    Activity Tolerance Patient tolerated treatment well             Past Medical History:  Diagnosis Date   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   Subjective Assessment - 08/03/21 0747     Subjective Pt with copious saliva, using paper towel to wipe secretions    Patient is accompained by: Family member   [name]   Currently in Pain? No/denies                   ADULT SLP TREATMENT - 08/03/21 0748       General Information   Behavior/Cognition Alert;Cooperative;Pleasant mood    HPI Patient is a 71 year old male with past medical history of TIA, elevated PSA, referred by neurologist in [facility] for slurred speech and dysphagia. 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 Patient had gradual speech changes beginning in 2020. This has been progressive, and patient began to notice difficulty with swallowing.  MBS performed 10/26/2020 with recommendation for regular/thin diet with aspiration precautions; residue was noted in the valleculae, pyriforms, and posterior pharyngeal wall.      Treatment Provided   Treatment provided Dysphagia;Cognitive-Linquistic      Dysphagia Treatment   Respiratory Status Room air    Oral Cavity - Dentition Adequate natural dentition    Treatment Methods Patient/caregiver education;Skilled observation;Compensation strategy training    Patient observed directly with PO's Yes    Type of PO's observed Thin liquids;Dysphagia 3 (soft)    Liquids provided via Cup   water bottle   Oral Phase Signs & Symptoms Prolonged bolus formation;Anterior loss/spillage;Prolonged mastication;Right pocketing;Left pocketing   anterior secretion loss,   Pharyngeal Phase Signs & Symptoms Immediate cough;Wet vocal quality;Multiple swallows   talking when eating despite cues   Type of cueing Verbal    Amount of cueing Moderate    Other treatment/comments Pt reports struggling to drink daily medication, primarily due to taste. He states he will be glad if he can use his feeding tube for this; placement is scheduled for June 13. Pt states his swallowing is "better," however spouse states she feels pt is "struggling." She tries to avoid talking to pt at meals. With mechanical soft solid (chewy granola bar), pt had significant difficulty with  bolus formation/cohesion, requiring 7 swallows, finger sweeps, and liquid washes for oral clearance. Copious saliva with frequent anterior secretion loss. Pt with overt s/sx aspiration x1 after attempting to talk while clearing solid, despite direct cues to refrain from talking. Reminded pt/spouse of table tent made with precaution reminders that can be placed on table at mealtimes to reinforce precautions. Pt/spouse report  selecting primarily soft, moist foods. Continue to educate/reinforce aspiration precautions.      Cognitive-Linquistic Treatment   Treatment focused on Dysarthria;Patient/family/caregiver education    Skilled Treatment Reviewed intelligibility strategies; pt reports spouse prompting him to communicate face-to-face. Pt required moderate cues to swallow for saliva management to increase intelligibility. Graduate clinician (unfamiliar listener) rated intelligibility at 60%. Majority of session focus on dysphagia; pt agreeable to target first-letter cuing and intelligibility next session.      Assessment / Recommendations / Plan   Plan Continue with current plan of care      Dysphagia Recommendations   Diet recommendations Dysphagia 3 (mechanical soft);Dysphagia 2 (fine chop);Thin liquid    Liquids provided via Cup    Medication Administration Whole meds with liquid   crush if needed   Supervision Patient able to self feed    Compensations Slow rate;Small sips/bites;Follow solids with liquid      Progression Toward Goals   Progression toward goals Progressing toward goals              SLP Education - 08/03/21 0800     Education Details aspiration precautions, no talking when eating    Person(s) Educated Patient;Spouse    Methods Explanation    Comprehension Verbalized understanding              SLP Short Term Goals - 03/13/21 1642       SLP SHORT TERM GOAL #1   Title Patient will demonstrate dysarthria HEP with modified independence.    Time 10    Period --   visits   Status Achieved      SLP SHORT TERM GOAL #2   Title Patient will use compensations for dysarthria for paragraph reading level 90% accuracy.    Time 10    Period Weeks    Status Achieved      SLP SHORT TERM GOAL #3   Title Patient will complete clinical swallowing assessment with goals added PRN    Time 2    Period Weeks    Status Achieved              SLP Long Term Goals - 03/13/21 1643        SLP LONG TERM GOAL #1   Title Patient will use compensations for dysarthria in 15 minutes mod complex conversation for greater than 95% intelligibility.    Time 12    Period Weeks    Status On-going      SLP LONG TERM GOAL #2   Title Patient will verbalize signs/symptoms of aspiration and aspiration pneumonia.    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 Continues to report frustration with sialorrhea; may  benefit from home oral suction. Encouraged pt/spouse to discuss with MD next visit. Pt requires ongoing education to increase understanding of impacts of his diagnosis on swallowing and communication, as well as for symptom management. Repeat MBS may be appropriate; will discuss further at next appointment. Will continue skilled ST at frequency of 1x every other week with focus on maximizing communicative and swallowing function, as well as education/training in augmentative/alternative communication.    Speech Therapy Frequency 1x /week   every other week   Duration 12 weeks    Treatment/Interventions Aspiration precaution training;Cueing hierarchy;SLP instruction and feedback;Pharyngeal strengthening exercises;Compensatory techniques;Functional tasks;Compensatory strategies;Diet toleration management by SLP;Trials of upgraded texture/liquids;Multimodal communcation approach;Internal/external aids;Patient/family education    Potential to Achieve Goals [place]    Potential Considerations Medical prognosis    Consulted and Agree with Plan of Care Patient;Family member/caregiver    Family Member Consulted wife             Patient will  benefit from skilled therapeutic intervention in order to improve the following deficits and impairments:   Dysarthria and anarthria  Dysphagia, oropharyngeal phase    Problem List There are no problems to display for this patient.  Deneise Lever, [place], CCC-SLP Speech-Language Pathologist [phone]   Aliene Altes, [place] 08/03/2021, 8:02 AM  [place] [facility] [address] [place], [place], [address] Phone: [phone]   Fax:  [phone]   Name: Donald Gould MRN: [id number] Date of Birth: 1950/10/01

## 2021-08-07 ENCOUNTER — Encounter: Payer: Self-pay | Admitting: Dermatology

## 2021-08-16 ENCOUNTER — Ambulatory Visit: Payer: Medicare Other | Admitting: Speech Pathology

## 2021-08-16 DIAGNOSIS — Z01818 Encounter for other preprocedural examination: Secondary | ICD-10-CM | POA: Insufficient documentation

## 2021-08-30 ENCOUNTER — Ambulatory Visit: Payer: Medicare Other | Attending: Neurology | Admitting: Speech Pathology

## 2021-08-30 DIAGNOSIS — R471 Dysarthria and anarthria: Secondary | ICD-10-CM

## 2021-08-30 DIAGNOSIS — R1312 Dysphagia, oropharyngeal phase: Secondary | ICD-10-CM | POA: Diagnosis present

## 2021-08-31 NOTE — Therapy (Signed)
[place] MAIN [facility] SERVICES [address] [place], [place], [address] Phone: [phone]   Fax:  [phone]  Speech Language Pathology Treatment  Patient Details  Name: Donald Gould MRN: [id number] Date of Birth: 23-Jan-1951 Referring Provider (SLP): Silvestre Moment, MD   Encounter Date: 08/30/2021   End of Session - 08/31/21 1844     Visit Number 15    Number of Visits 24    Date for SLP Re-Evaluation 10/02/21    SLP Start Time 6    SLP Stop Time  1500    SLP Time Calculation (min) 60 min    Activity Tolerance Patient tolerated treatment well             Past Medical History:  Diagnosis Date   ALS (amyotrophic lateral sclerosis) ([place])    Environmental allergies     Past Surgical History:  Procedure Laterality Date   CATARACT EXTRACTION W/PHACO Right 07/02/2021   Procedure: CATARACT EXTRACTION PHACO AND INTRAOCULAR LENS PLACEMENT ([place]) RIGHT;  Surgeon: Leandrew Koyanagi, MD;  Location: [place];  Service: Ophthalmology;  Laterality: Right;  11.84 1:15.6   RETINAL DETACHMENT SURGERY Left 01/10/2021   [facility]    There were no vitals filed for this visit.   Subjective Assessment - 08/30/21 1450     Subjective Had PEG placed this week    Currently in Pain? Yes    Pain Score 6     Pain Location Chest    Pain Orientation Left                   ADULT SLP TREATMENT - 08/30/21 1453       General Information   Behavior/Cognition Alert;Cooperative;Pleasant mood    HPI Patient is a 71 year old male with past medical history of TIA, elevated PSA, referred by neurologist in [facility] for slurred speech and dysphagia. 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 Patient had gradual  speech changes beginning in 2020. This has been progressive, and patient began to notice difficulty with swallowing.  MBS performed 10/26/2020 with recommendation for regular/thin diet with aspiration precautions; residue was noted in the valleculae, pyriforms, and posterior pharyngeal wall.      Treatment Provided   Treatment provided Dysphagia;Cognitive-Linquistic      Dysphagia Treatment   Respiratory Status Room air    Oral Cavity - Dentition Adequate natural dentition    Treatment Methods Patient/caregiver education;Skilled observation;Compensation strategy training    Patient observed directly with PO's Yes    Type of PO's observed Thin liquids    Liquids provided via Cup   water bottle   Oral Phase Signs & Symptoms Prolonged bolus formation;Anterior loss/spillage;Prolonged mastication;Right pocketing;Left pocketing   anterior secretion loss,   Pharyngeal Phase Signs & Symptoms --   none observed   Amount of cueing Independent      Cognitive-Linquistic Treatment   Treatment focused on Dysarthria;Patient/family/caregiver education    Skilled Treatment Patient reports saliva worsening and greater impacts on intelligibility in last few weeks. Continue to educate on secretion management and communication strategies (provide context, first-letter cuing, write/spell/first letter cuing), as well as caregiver supportive techniques for intelligibility. Patient reports speaking with a friend who is a neurosurgeon who recommended a supplement. Encouraged pt to discuss this with MD/ALS team to verify safety/potential drug interactions. No overt  s/sx aspiration as pt sipped thin liquids today during session. Due to difficulty with secretion management, discussed home suction equipment to aid with oral care and managing secretions. Will request from MD.      Assessment / Recommendations / [place] with current plan of care      Dysphagia Recommendations   Diet recommendations Dysphagia 3  (mechanical soft);Dysphagia 2 (fine chop)   chop, moisten meats as needed   Liquids provided via Cup    Medication Administration Whole meds with liquid   crush if needed   Supervision Patient able to self feed    Compensations Slow rate;Small sips/bites;Follow solids with liquid      Progression Toward Goals   Progression toward goals Progressing toward goals              SLP Education - 08/31/21 1844     Education Details home suction equipment    Person(s) Educated Patient;Spouse    Methods Explanation    Comprehension Verbalized understanding              SLP Short Term Goals - 03/13/21 1642       SLP SHORT TERM GOAL #1   Title Patient will demonstrate dysarthria HEP with modified independence.    Time 10    Period --   visits   Status Achieved      SLP SHORT TERM GOAL #2   Title Patient will use compensations for dysarthria for paragraph reading level 90% accuracy.    Time 10    Period Weeks    Status Achieved      SLP SHORT TERM GOAL #3   Title Patient will complete clinical swallowing assessment with goals added PRN    Time 2    Period Weeks    Status Achieved              SLP Long Term Goals - 03/13/21 1643       SLP LONG TERM GOAL #1   Title Patient will use compensations for dysarthria in 15 minutes mod complex conversation for greater than 95% intelligibility.    Time 12    Period Weeks    Status On-going      SLP LONG TERM GOAL #2   Title Patient will verbalize signs/symptoms of aspiration and aspiration pneumonia.    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 Continues to report  frustration with sialorrhea; would benefit from home oral suction to aid with oral care and secretion management. Encouraged pt/spouse to discuss with MD next visit. Pt requires ongoing education to increase understanding of impacts of his diagnosis on swallowing and communication, as well as for symptom management. Pt reports no increasing s/sx dysphagia and demonstrates ability to self-select appropriate textures; consider repeat MBSS if s/sx aspiration increase. Will continue skilled ST at frequency of 1x every other week with focus on maximizing communicative and swallowing function, as well as education/training in augmentative/alternative communication.    Speech Therapy Frequency 1x /week   every other week  Duration 12 weeks    Treatment/Interventions Aspiration precaution training;Cueing hierarchy;SLP instruction and feedback;Pharyngeal strengthening exercises;Compensatory techniques;Functional tasks;Compensatory strategies;Diet toleration management by SLP;Trials of upgraded texture/liquids;Multimodal communcation approach;Internal/external aids;Patient/family education    Potential to Achieve Goals Fair    Potential Considerations Medical prognosis   suspect progressive neurologic etiology   Consulted and Agree with Plan of Care Patient;Family member/caregiver    Family Member Consulted wife             Patient will benefit from skilled therapeutic intervention in order to improve the following deficits and impairments:   Dysphagia, oropharyngeal phase  Dysarthria and anarthria    Problem List There are no problems to display for this patient.  Deneise Lever, [place], CCC-SLP Speech-Language Pathologist [phone]  Aliene Altes, [place] 08/31/2021, 6:47 PM  [place] MAIN [facility] SERVICES [address] [place], [place], [address] Phone: [phone]   Fax:  [phone]   Name: Donald Gould MRN: [id number] Date of Birth:  1951/03/01

## 2021-09-03 ENCOUNTER — Telehealth: Payer: Self-pay | Admitting: Speech Pathology

## 2021-09-03 NOTE — Telephone Encounter (Signed)
Placed call to pt's neurologist Ronne Binning, MD requesting order for home oral suction for oral care, secretion management.   Deneise Lever, [place], Actor [phone]

## 2021-09-07 ENCOUNTER — Ambulatory Visit (INDEPENDENT_AMBULATORY_CARE_PROVIDER_SITE_OTHER): Payer: Medicare Other | Admitting: Podiatry

## 2021-09-07 DIAGNOSIS — B351 Tinea unguium: Secondary | ICD-10-CM | POA: Diagnosis not present

## 2021-09-07 DIAGNOSIS — M79675 Pain in left toe(s): Secondary | ICD-10-CM | POA: Diagnosis not present

## 2021-09-07 DIAGNOSIS — M79674 Pain in right toe(s): Secondary | ICD-10-CM | POA: Diagnosis not present

## 2021-09-11 ENCOUNTER — Telehealth: Payer: Self-pay | Admitting: Speech Pathology

## 2021-09-13 ENCOUNTER — Ambulatory Visit: Payer: Medicare Other | Admitting: Speech Pathology

## 2021-09-13 DIAGNOSIS — R1312 Dysphagia, oropharyngeal phase: Secondary | ICD-10-CM

## 2021-09-13 DIAGNOSIS — R471 Dysarthria and anarthria: Secondary | ICD-10-CM

## 2021-09-13 NOTE — Patient Instructions (Signed)
Suction - Use it to clean your mouth before and after meals -Ask the rep if there are any toothbrushes or sponges that hook up to the equipment. -You can also use it to help you remove extra saliva during the day.   Eating/Drinking - Sip from a cup or a bottle rather than using a straw. Using a straw makes you tend to tilt your head back too far and it makes it harder for you take small sips.

## 2021-09-13 NOTE — Therapy (Signed)
OUTPATIENT SPEECH LANGUAGE PATHOLOGY TREATMENT NOTE   Patient Name: Donald Gould MRN: [id number] DOB:20-Apr-1950, 71 y.o., male Today's Date: 09/13/2021  PCP: Derinda Late, MD REFERRING PROVIDER: Silvestre Moment, MD   End of Session - 09/13/21 2013     Visit Number 16    Number of Visits 24    Date for SLP Re-Evaluation 10/02/21    SLP Start Time 60    SLP Stop Time  1500    SLP Time Calculation (min) 60 min    Activity Tolerance Patient tolerated treatment well             Past Medical History:  Diagnosis Date   ALS (amyotrophic lateral sclerosis) ([place])    Environmental allergies    Past Surgical History:  Procedure Laterality Date   CATARACT EXTRACTION W/PHACO Right 07/02/2021   Procedure: CATARACT EXTRACTION PHACO AND INTRAOCULAR LENS PLACEMENT ([place]) RIGHT;  Surgeon: Leandrew Koyanagi, MD;  Location: [place];  Service: Ophthalmology;  Laterality: Right;  11.84 1:15.6   RETINAL DETACHMENT SURGERY Left 01/10/2021   [facility]   There are no problems to display for this patient.   ONSET DATE: 10/23/2020   referral date; pt reports gradual onset of speech symptoms beginning in 2020   REFERRING DIAG: speech disturbance, dysphagia   HPI: Patient is a 71 year old male with past medical history of TIA, elevated PSA, initially referred by neurologist in [facility] for slurred speech and dysphagia. 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 Patient had gradual speech changes beginning in 2020. This has been progressive, and patient began to notice difficulty with swallowing.  MBS performed 10/26/2020 with recommendation for regular/thin diet with aspiration precautions; residue was noted in the valleculae, pyriforms, and posterior pharyngeal  wall.  THERAPY DIAG:  Dysphagia, oropharyngeal phase  Dysarthria and anarthria  Rationale for Evaluation and Treatment Rehabilitation  SUBJECTIVE: Pt reports frustration with saliva Pt accompanied by:  spouse [name] PAIN:  Are you having pain? No     OBJECTIVE:   TODAY'S TREATMENT: Patient reports worsening saliva; orders were placed for portable home suction and supply company coming today to set up at pt's home. SLP provided education on rationale and opportunities for using home suction including prior to and after meals for oral care and secretion management. Pt with reduced intraoral pressure and difficulty controlling bolus size when drinking through straw of his to-go cup. He was also noted to tilt head posteriorly to aid bolus transfer. Improved bolus management with single cup sips. Utilized first-letter cuing via visual aid and trained pt in using this when breakdowns occur; pt used x3 in session with mod cues required. Handouts provided for home use.  PATIENT EDUCATION: Education details: cup sips vs straw sips, oral care, when to use portable suction, first letter cuing Person educated: Patient and Spouse Education method: Explanation, Demonstration, Verbal cues, and Handouts Education comprehension: verbalized understanding, verbal cues required, and needs further education   SLP Short Term Goals - 03/13/21 1642       SLP SHORT TERM GOAL #1   Title Patient will demonstrate dysarthria HEP with modified independence.    Time 10    Period --   visits   Status Achieved      SLP SHORT TERM GOAL #2   Title Patient will use compensations for dysarthria for paragraph reading  level 90% accuracy.    Time 10    Period Weeks    Status Achieved      SLP SHORT TERM GOAL #3   Title Patient will complete clinical swallowing assessment with goals added PRN    Time 2    Period Weeks    Status Achieved              SLP Long Term Goals - 03/13/21 1643       SLP LONG  TERM GOAL #1   Title Patient will use compensations for dysarthria in 15 minutes mod complex conversation for greater than 95% intelligibility.    Time 12    Period Weeks    Status On-going      SLP LONG TERM GOAL #2   Title Patient will verbalize signs/symptoms of aspiration and aspiration pneumonia.    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 Continues to report frustration with sialorrhea; would benefit from home oral suction to aid with oral care and secretion management. Encouraged pt/spouse to discuss with MD next visit. Pt requires ongoing education to increase understanding of impacts of his diagnosis on swallowing and communication, as well as for symptom management. Pt reports no increasing s/sx dysphagia and demonstrates ability to self-select appropriate textures; consider repeat MBSS if s/sx aspiration increase. Will continue skilled ST at frequency of 1x every other week with focus on maximizing communicative and swallowing function, as well as education/training in augmentative/alternative communication.    Speech Therapy Frequency 1x /week   every other week   Duration 12 weeks    Treatment/Interventions Aspiration precaution training;Cueing hierarchy;SLP instruction and feedback;Pharyngeal strengthening exercises;Compensatory techniques;Functional tasks;Compensatory strategies;Diet toleration management by SLP;Trials of upgraded texture/liquids;Multimodal communcation approach;Internal/external aids;Patient/family education    Potential to Achieve Goals Fair    Potential Considerations Medical prognosis   suspect progressive  neurologic etiology   Consulted and Agree with Plan of Care Patient;Family member/caregiver    Family Member Consulted wife             Deneise Lever, [place], [place] Pathologist ([place], [place] 09/13/2021, 8:15 PM

## 2021-09-27 ENCOUNTER — Ambulatory Visit: Payer: Medicare Other | Admitting: Speech Pathology

## 2021-10-11 ENCOUNTER — Ambulatory Visit: Payer: Medicare Other | Attending: Neurology | Admitting: Speech Pathology

## 2021-10-11 DIAGNOSIS — R1312 Dysphagia, oropharyngeal phase: Secondary | ICD-10-CM | POA: Diagnosis not present

## 2021-10-11 DIAGNOSIS — R471 Dysarthria and anarthria: Secondary | ICD-10-CM | POA: Diagnosis present

## 2021-10-11 NOTE — Therapy (Addendum)
OUTPATIENT SPEECH LANGUAGE PATHOLOGY TREATMENT NOTE AND RECERTIFICATION   Patient Name: Donald Gould MRN: [id number] DOB:[birth year], 71 y.o., male Today's Date: 10/11/2021  PCP: Derinda Late, MD REFERRING PROVIDER: Silvestre Moment, MD   End of Session - 10/11/21 1641     Visit Number 17    Number of Visits 24    Date for SLP Re-Evaluation 12/31/2021   SLP Start Time 1340    SLP Stop Time  1440    SLP Time Calculation (min) 60 min    Activity Tolerance Patient tolerated treatment well             Past Medical History:  Diagnosis Date   ALS (amyotrophic lateral sclerosis) ([place])    Environmental allergies    Past Surgical History:  Procedure Laterality Date   CATARACT EXTRACTION W/PHACO Right 07/02/2021   Procedure: CATARACT EXTRACTION PHACO AND INTRAOCULAR LENS PLACEMENT ([place]) RIGHT;  Surgeon: Leandrew Koyanagi, MD;  Location: [place];  Service: Ophthalmology;  Laterality: Right;  11.84 1:15.6   RETINAL DETACHMENT SURGERY Left 01/10/2021   [facility]   There are no problems to display for this patient.   ONSET DATE: 10/23/2020   referral date; pt reports gradual onset of speech symptoms beginning in 2020   REFERRING DIAG: speech disturbance, dysphagia   HPI: Patient is a 71 year old male with past medical history of TIA, elevated PSA, initially referred by neurologist in [facility] for slurred speech and dysphagia. 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 Patient had gradual speech changes beginning in 2020. This has been progressive, and patient began to notice difficulty with swallowing.  MBS performed 10/26/2020 with recommendation for regular/thin diet with aspiration precautions; residue was noted in the valleculae, pyriforms, and  posterior pharyngeal wall.  THERAPY DIAG:  Dysphagia, oropharyngeal phase  Dysarthria and anarthria  Rationale for Evaluation and Treatment Rehabilitation  SUBJECTIVE: "At breakfast I coughed up a green bean from last night." Pt accompanied by:  spouse [place] PAIN:  Are you having pain? No     OBJECTIVE:   TODAY'S TREATMENT: Pt reports increasing difficulty with solids: "It feels like something in my throat has gotten smaller." Reports increased sensation of pharygneal residue. Had wrong-pipe sensation with breading from a chicken finger. Discussed repeat instrumental with pt and spouse, who are in agreement. Will request MBS orders from MD. Intelligibility for this familiar listener ~75% today with context. Pt did not bring phone or tablet, however SLP used clinic tablet to introduce text-to-speech apps. Pt typed responses to simple questions and clarified message during communication breakdowns using text to speech with usual mod cues. Pt reports his brother doesn't understand him and asks to speak to [place]. SLP encouraged pt to download app and practice pre-loading messages to practice for phone call with brother.   PATIENT EDUCATION: Education details: text to speech apps, repeat MBS Person educated: Patient and Spouse Education method: Explanation, Demonstration, Verbal cues, and Handouts Education comprehension: verbalized understanding, verbal cues required, and needs further education   SLP Short Term Goals - 03/13/21 1642       SLP SHORT TERM GOAL #1   Title Patient will demonstrate dysarthria HEP with modified independence.    Time 10    Period --   visits   Status Achieved      SLP SHORT TERM GOAL #2  Title Patient will use compensations for dysarthria for paragraph reading level 90% accuracy.    Time 10    Period Weeks    Status Achieved      SLP SHORT TERM GOAL #3   Title Patient will complete clinical swallowing assessment with goals added PRN    Time 2     Period Weeks    Status Achieved              SLP Long Term Goals - 03/13/21 1643       SLP LONG TERM GOAL #1   Title Patient will use compensations for dysarthria in 15 minutes mod complex conversation for greater than 95% intelligibility.    Time 12    Period Weeks    Status On-going      SLP LONG TERM GOAL #2   Title Patient will verbalize signs/symptoms of aspiration and aspiration pneumonia.    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 Intelligibility declined today from previous session; pt and spouse report some communication breakdowns at home. Pt receptive to using text-to-speech apps and required mod cues for this in session today. Pt reports increasing s/sx dysphagia; reports coughing, wrong pipe sensation, and pharyngeal residue after solids. Recommend repeat MBS to evaluate safest, most comfortable consistency as well as compensatory strategies to improve safety and comfort with POs. Will continue skilled ST at frequency of 1x every other week with focus on maximizing communicative and swallowing function, as well as education/training in augmentative/alternative communication.    Speech Therapy Frequency --   every other week   Duration 12 weeks    Treatment/Interventions Aspiration precaution training;Cueing hierarchy;SLP instruction and feedback;Pharyngeal strengthening exercises;Compensatory techniques;Functional tasks;Compensatory strategies;Diet toleration management by SLP;Trials of upgraded texture/liquids;Multimodal communcation approach;Internal/external aids;Patient/family education    Potential to Achieve Goals  Fair    Potential Considerations Medical prognosis   suspect progressive neurologic etiology   Consulted and Agree with Plan of Care Patient;Family member/caregiver    Family Member Consulted wife             Deneise Lever, [place], [place] Pathologist ([place], [place] 10/11/2021, 4:56 PM

## 2021-10-11 NOTE — Patient Instructions (Signed)
[employer]: funding for equipment and communication devices  https://teamgleason.org/  WAY[place]: Homework!  Download the [employer].  2. Look through the "stored" phrases by using the arrows at the top. Practice playing some of them  3. Tap on the keyboard icon at the top of the screen. Practice typing your own messages. Think about some things you might want to ask Elenore Rota. Type your message and hit the star button to save the message.  4. Bring your tablet and phone next time.

## 2021-10-17 ENCOUNTER — Other Ambulatory Visit: Payer: Self-pay | Admitting: Neurology

## 2021-10-17 DIAGNOSIS — R1310 Dysphagia, unspecified: Secondary | ICD-10-CM

## 2021-10-17 DIAGNOSIS — G1221 Amyotrophic lateral sclerosis: Secondary | ICD-10-CM

## 2021-10-22 IMAGING — RF DG SWALLOWING FUNCTION
12 of 16 series · 12 of 24 positions shown · non-contrast
Comparison: None.

CLINICAL DATA: Dysphagia.

EXAM:
MODIFIED BARIUM SWALLOW
TECHNIQUE: Different consistencies of barium were administered orally to the
patient by the Speech Pathologist. Imaging of the pharynx was
performed in the lateral projection. The radiologist was present in
the fluoroscopy room for this study, providing personal supervision.
FLUOROSCOPY TIME:  Fluoroscopy Time:  1 minutes 46 seconds
Radiation Exposure Index (if provided by the fluoroscopic device):
Number of Acquired Spot Images: 14

[Series 1: run · 1 of 75 frames shown (1 of 12)]
[frame 64/75]
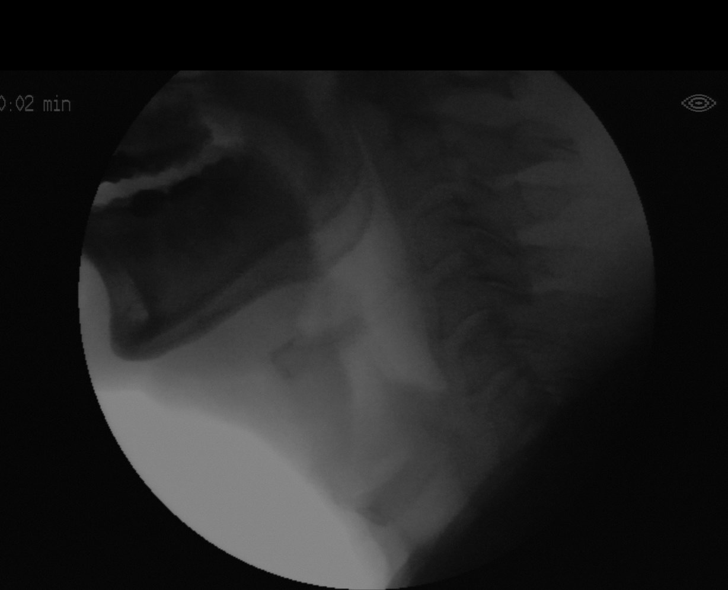

[Series 3: run · 1 of 126 frames shown (2 of 12)]
[frame 19/126]
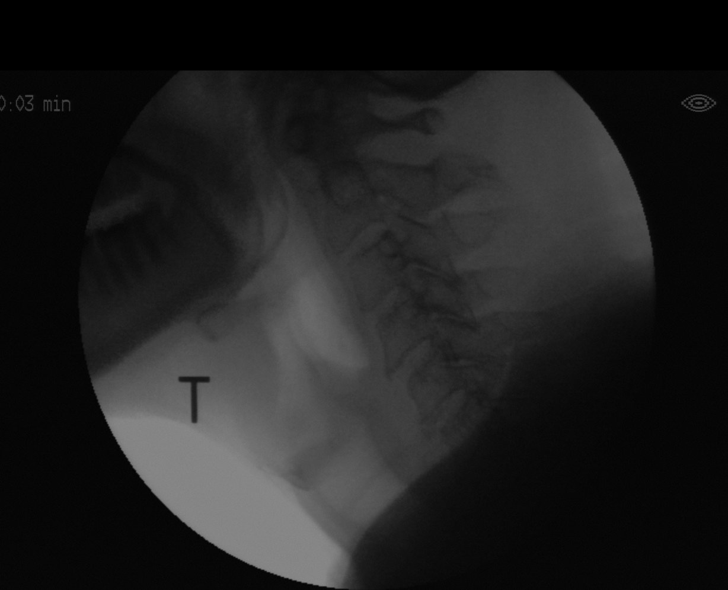

[Series 4: run · 1 of 109 frames shown (3 of 12)]
[frame 55/109]
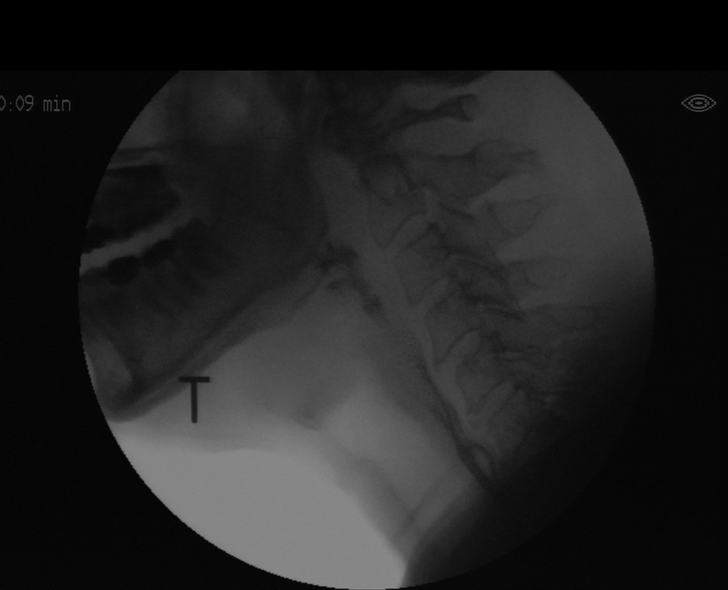

[Series 5: run · 1 of 463 frames shown (4 of 12)]
[frame 463/463]
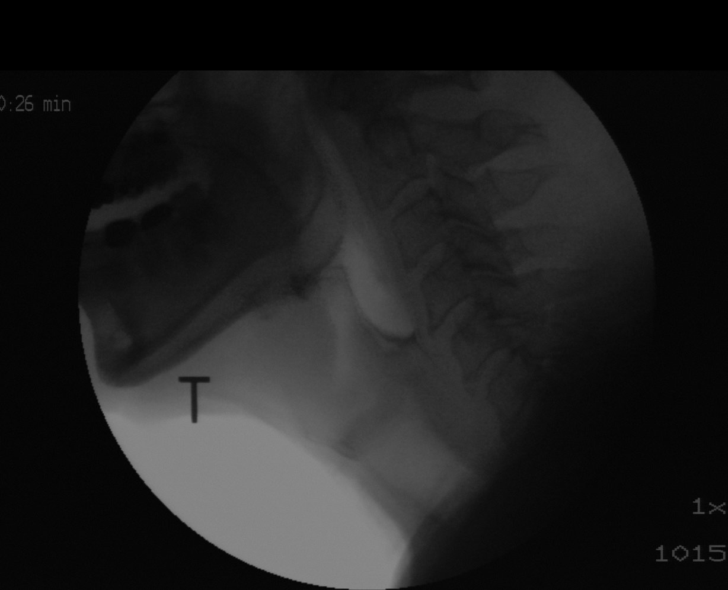

[Series 7: run · 1 of 185 frames shown (5 of 12)]
[frame 41/185]
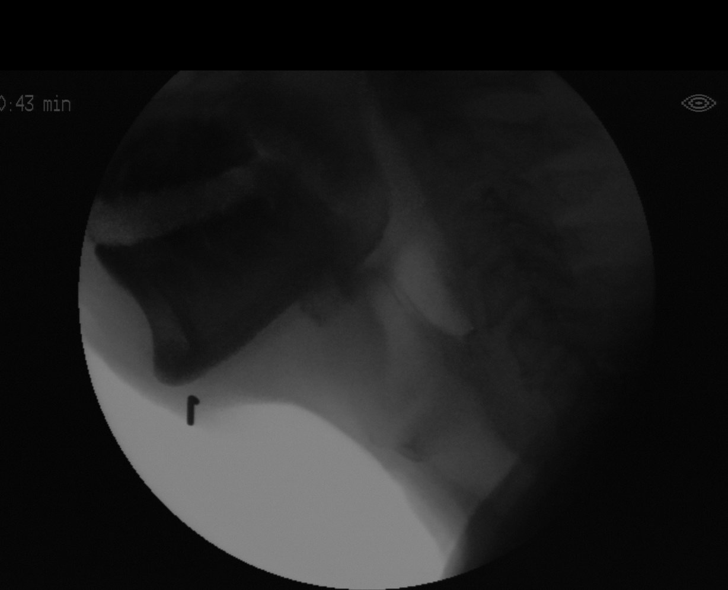

[Series 8: run · 1 of 67 frames shown (6 of 12)]
[frame 34/67]
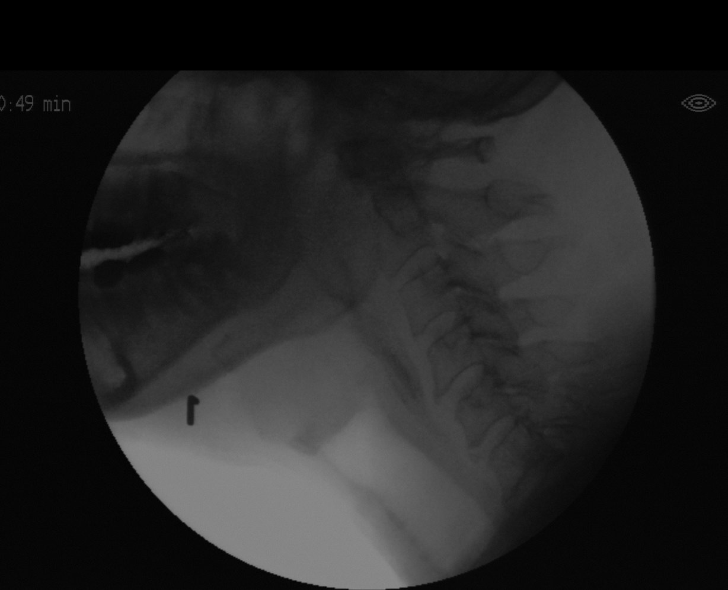

[Series 10: run · 1 of 76 frames shown (7 of 12)]
[frame 12/76]
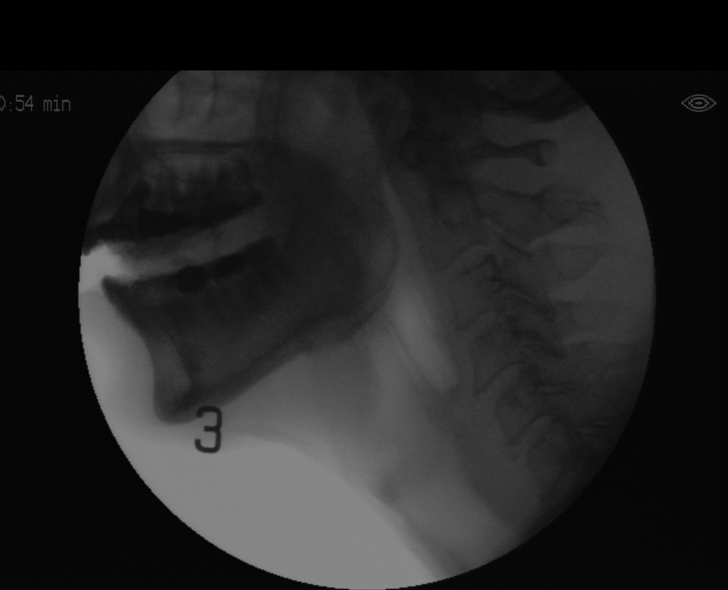

[Series 11: run · 1 of 124 frames shown (8 of 12)]
[frame 63/124]
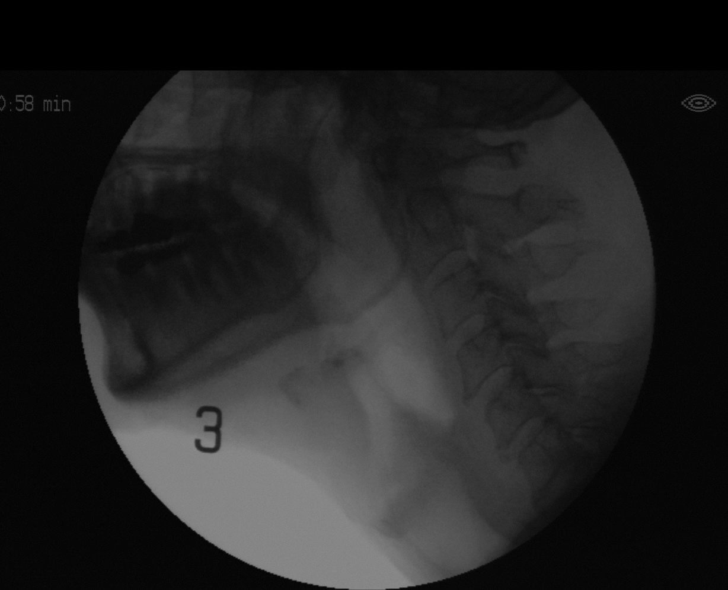

[Series 12: run · 1 of 97 frames shown (9 of 12)]
[frame 83/97]
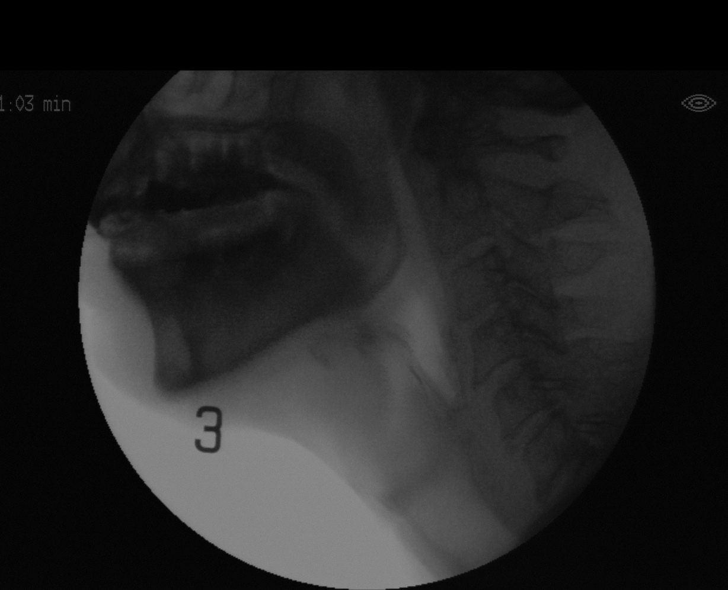

[Series 14: run · 1 of 271 frames shown (10 of 12)]
[frame 41/271]
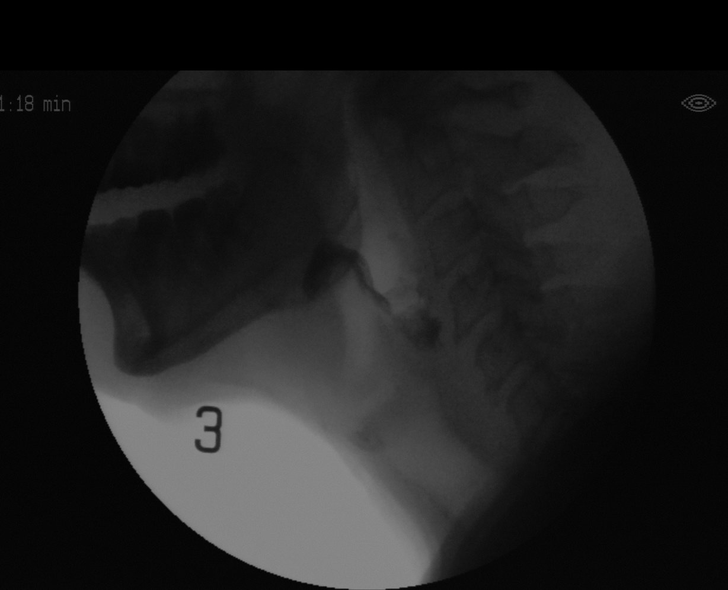

[Series 15: run · 1 of 444 frames shown (11 of 12)]
[frame 223/444]
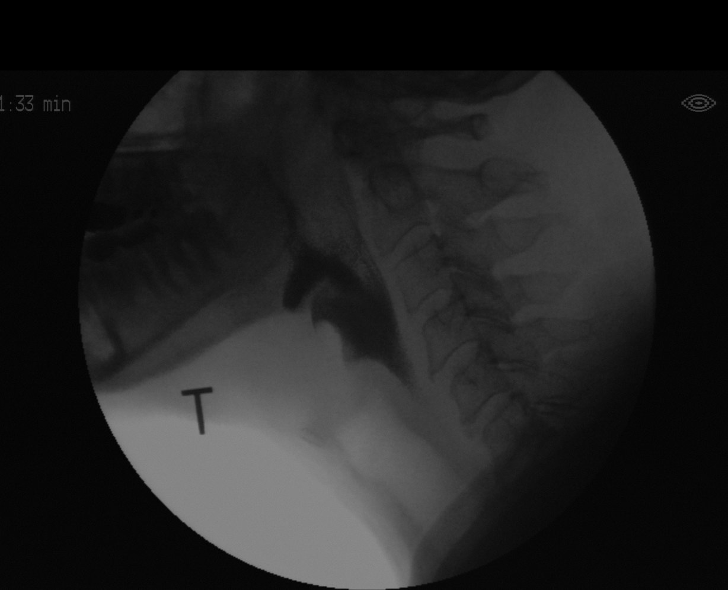

[Series 16: run · 1 of 166 frames shown (12 of 12)]
[frame 142/166]
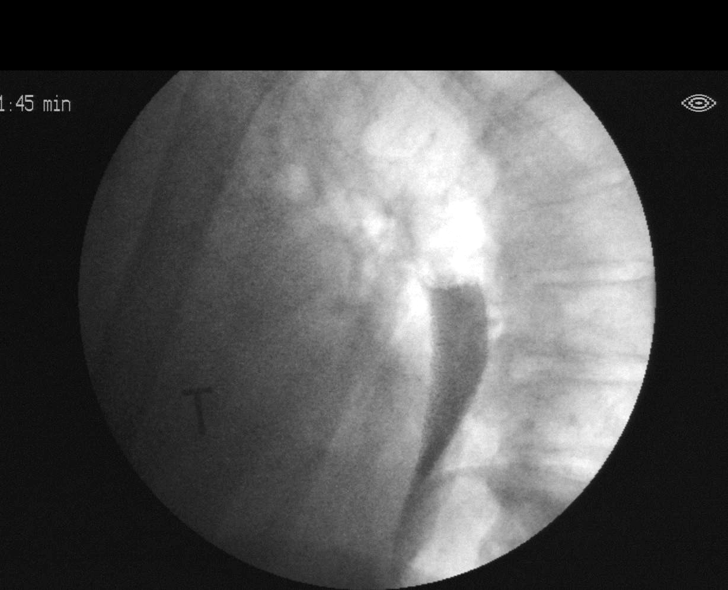

[12 of 24 positions shown; findings below may reference images not displayed]

FINDINGS: No aspiration identified. There was delay in oral transit. Delay in
initiating swallowing. There was residual in the vallecula and
piriform sinus.
IMPRESSION: Oral delay.  Delayed initiating swallowing with residual.

Negative for aspiration.

Please refer to the Speech Pathologists report for complete details
and recommendations.

## 2021-10-25 ENCOUNTER — Ambulatory Visit: Payer: Medicare Other | Attending: Neurology | Admitting: Speech Pathology

## 2021-10-25 DIAGNOSIS — R471 Dysarthria and anarthria: Secondary | ICD-10-CM | POA: Insufficient documentation

## 2021-10-25 DIAGNOSIS — R1312 Dysphagia, oropharyngeal phase: Secondary | ICD-10-CM | POA: Diagnosis present

## 2021-10-25 NOTE — Therapy (Signed)
OUTPATIENT SPEECH LANGUAGE PATHOLOGY TREATMENT NOTE    Patient Name: Donald Gould MRN: [id number] DOB:12/06/1950, 71 y.o., male Today's Date: 10/25/2021  PCP: Derinda Late, MD REFERRING PROVIDER: Silvestre Moment, MD   End of Session - 10/25/21 1657     Visit Number 18    Number of Visits 24    Date for SLP Re-Evaluation 12/31/21    SLP Start Time 73    SLP Stop Time  1500    SLP Time Calculation (min) 60 min    Activity Tolerance Patient tolerated treatment well               Past Medical History:  Diagnosis Date   ALS (amyotrophic lateral sclerosis) ([place])    Environmental allergies    Past Surgical History:  Procedure Laterality Date   CATARACT EXTRACTION W/PHACO Right 07/02/2021   Procedure: CATARACT EXTRACTION PHACO AND INTRAOCULAR LENS PLACEMENT ([place]) RIGHT;  Surgeon: Leandrew Koyanagi, MD;  Location: [facility];  Service: Ophthalmology;  Laterality: Right;  11.84 1:15.6   RETINAL DETACHMENT SURGERY Left 01/10/2021   [facility]   There are no problems to display for this patient.   ONSET DATE: 10/23/2020   referral date; pt reports gradual onset of speech symptoms beginning in 2020   REFERRING DIAG: speech disturbance, dysphagia   HPI: Patient is a 71 year old male with past medical history of TIA, elevated PSA, initially referred by neurologist in [facility] for slurred speech and dysphagia. 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 Patient had gradual speech changes beginning in 2020. This has been progressive, and patient began to notice difficulty with swallowing.  MBS performed 10/26/2020 with recommendation for regular/thin diet with aspiration precautions; residue was noted in the valleculae, pyriforms, and posterior  pharyngeal wall.  THERAPY DIAG:  Dysphagia, oropharyngeal phase  Dysarthria and anarthria  Rationale for Evaluation and Treatment Rehabilitation  SUBJECTIVE: "I got the shot right here," re: Botox for saliva Pt accompanied by: self PAIN:  Are you having pain? No     OBJECTIVE:   TODAY'S TREATMENT: Pt reports increasing difficulty swallowing. MBS has been scheduled for 11/14/21. In session today, pt had difficulty with oral transfer of liquids and overt s/sx aspiration (wet vocal quality). Pt reports greater ease and comfort with nectar thick liquids. Provided handout with images and information on where pt can purchase thickener should this increase his comfort for swallowing liquids. Pt reported difficulty with dry toast this morning, reported he was scared he couldn't swallow it. Provided handout on IDDSI level 6, soft/bite sized foods and cautioned against dry breads due to choking risk. Worked with pt to ID foods he likes that may be easier to swallow. Suggested smaller, more frequent meals, and encouraged pt consider supplemental feeds via PEG to reduce fatigue. SLP had to request context on several occasions to aid with communication breakdowns. Pt used text-to-speech app on his tablet to clarify his message when necessary however usually mod cues required for this. Encouraged pt to practice this afternoon with his brother who is visiting him.     PATIENT EDUCATION: Education details: use of text-to-speech app, diet modifications to increase comfort (nectar, IDDSI level 6) Person educated: Patient and Spouse Education method: Explanation, Demonstration, Verbal cues, and Handouts Education comprehension: verbalized understanding, verbal cues required, and needs further education   SLP Short Term  Goals - 03/13/21 1642       SLP SHORT TERM GOAL #1   Title Patient will demonstrate dysarthria HEP with modified independence.    Time 10    Period --   visits   Status Achieved       SLP SHORT TERM GOAL #2   Title Patient will use compensations for dysarthria for paragraph reading level 90% accuracy.    Time 10    Period Weeks    Status Achieved      SLP SHORT TERM GOAL #3   Title Patient will complete clinical swallowing assessment with goals added PRN    Time 2    Period Weeks    Status Achieved              SLP Long Term Goals - 03/13/21 1643       SLP LONG TERM GOAL #1   Title Patient will use compensations for dysarthria in 15 minutes mod complex conversation for greater than 95% intelligibility.    Time 12    Period Weeks    Status On-going      SLP LONG TERM GOAL #2   Title Patient will verbalize signs/symptoms of aspiration and aspiration pneumonia.    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 Intelligibility continues to decline; pt and spouse report some communication breakdowns at home. Pt receptive to using text-to-speech apps and requires mod cues for this in session today. Pt reports mealtime fatigue, increasing difficulty with solids, and now some difficulties with thin liquids. Appearance of improved timing/airway protection with nectar liquids, encouraged pt to try these for comfort if desired. Will repeat MBS on 11/14/21 to evaluate safest, most comfortable consistency as well as compensatory strategies to improve safety and comfort with POs. Will continue skilled ST at frequency of 1x every other week with focus on maximizing communicative and swallowing function, as well as education/training in augmentative/alternative communication.    Speech Therapy Frequency --   every other week    Duration 12 weeks    Treatment/Interventions Aspiration precaution training;Cueing hierarchy;SLP instruction and feedback;Pharyngeal strengthening exercises;Compensatory techniques;Functional tasks;Compensatory strategies;Diet toleration management by SLP;Trials of upgraded texture/liquids;Multimodal communcation approach;Internal/external aids;Patient/family education    Potential to Achieve Goals Fair    Potential Considerations Medical prognosis   progressive neurologic etiology   Consulted and Agree with Plan of Care Patient;Family member/caregiver    Family Member Consulted wife             Deneise Lever, [place], [employer] Pathologist ([place], [place] 10/25/2021, 4:58 PM

## 2021-10-25 NOTE — Addendum Note (Signed)
Addended by: Aliene Altes on: 10/25/2021 04:57 PM   Modules accepted: Orders

## 2021-10-30 ENCOUNTER — Ambulatory Visit: Payer: Medicare Other

## 2021-11-08 ENCOUNTER — Ambulatory Visit: Payer: Medicare Other | Admitting: Speech Pathology

## 2021-11-14 ENCOUNTER — Ambulatory Visit
Admission: RE | Admit: 2021-11-14 | Discharge: 2021-11-14 | Disposition: A | Discharge status: Home / Self Care | Payer: Medicare Other | Source: Ambulatory Visit | Attending: Neurology | Admitting: Neurology

## 2021-11-14 DIAGNOSIS — R1310 Dysphagia, unspecified: Secondary | ICD-10-CM | POA: Insufficient documentation

## 2021-11-14 DIAGNOSIS — G1221 Amyotrophic lateral sclerosis: Secondary | ICD-10-CM | POA: Insufficient documentation

## 2021-11-15 NOTE — Therapy (Addendum)
[facility] [place], [place], [address] Phone: [phone]   Fax:     Modified Barium Swallow  Patient Details  Name: Donald Gould MRN: [id number] Date of Birth: 1950/09/12 Referring Provider (SLP): Ronne Binning, MD   Encounter Date: 11/14/2021   End of Session - 11/15/21 0819     Visit Number 1    Number of Visits 1    Date for SLP Re-Evaluation 11/14/21    SLP Start Time 1245    SLP Stop Time  1345    SLP Time Calculation (min) 60 min    Activity Tolerance Patient tolerated treatment well             Past Medical History:  Diagnosis Date   ALS (amyotrophic lateral sclerosis) ([place])    Environmental allergies     Past Surgical History:  Procedure Laterality Date   CATARACT EXTRACTION W/PHACO Right 07/02/2021   Procedure: CATARACT EXTRACTION PHACO AND INTRAOCULAR LENS PLACEMENT ([place]) RIGHT;  Surgeon: Leandrew Koyanagi, MD;  Location: [facility];  Service: Ophthalmology;  Laterality: Right;  11.84 1:15.6   RETINAL DETACHMENT SURGERY Left 01/10/2021   [facility]    There were no vitals filed for this visit.     11/15/21 0800  SLP Visit Information  SLP Received On 11/14/21  Subjective  Subjective Pt brought some foods from home  Pain Assessment  Pain Assessment No/denies pain  General Information  Date of Onset 03/27/21 (ALS dx date; symptom onset 2020)  HPI Patient is a 71 year old male with past medical history of TIA, elevated PSA, initially referred by neurologist in [facility] for slurred speech and dysphagia. 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 Patient had gradual speech changes beginning in 2020. This has been progressive, and patient began to notice  difficulty with swallowing. MBS performed 10/26/2020 with recommendation for regular/thin diet with aspiration precautions; residue was noted in the valleculae, pyriforms, and posterior pharyngeal wall.  Type of Study MBS-Modified Barium Swallow Study  Previous Swallow Assessment see HPI  Diet Prior to this Study Dysphagia 3 (soft);Thin liquids  Temperature Spikes Noted No  Respiratory Status Room air (O2 via face mask at night)  History of Recent Intubation No  Behavior/Cognition Cooperative;Alert  Oral Cavity Assessment Excessive secretions  Oral Care Completed by SLP No  Oral Cavity - Dentition Adequate natural dentition  Vision Functional for self feeding  Self-Feeding Abilities Able to feed self  Patient Positioning Upright in chair  Baseline Vocal Quality Wet;Low vocal intensity  Volitional Cough Strong  Volitional Swallow Able to elicit (with extended time)  Anatomy [facility]  Pharyngeal Secretions Not observed secondary MBS  Oral Motor/Sensory Function  Overall Oral Motor/Sensory Function Severe impairment  Facial ROM Reduced right;Reduced left  Facial Symmetry WFL  Facial Strength Reduced right;Reduced left  Lingual ROM Reduced right;Reduced left  Lingual Symmetry WFL  Lingual Strength Reduced (fasciculations present)  Velum Impaired right;Impaired left  Mandible WFL  Oral Preparation/Oral Phase  Oral Phase Impaired  Oral - Pudding  Oral - Pudding Teaspoon Weak lingual manipulation;Reduced posterior propulsion;Pocketing in anterior sulcus;Decreased bolus cohesion;Incomplete tongue to palate contact;Lingual pumping (unable to form bolus or transit; SLP removed)  Oral - Nectar  Oral - Nectar Teaspoon Weak lingual manipulation;Incomplete tongue to palate contact;Reduced posterior propulsion;Lingual/palatal residue;Lingual pumping;Decreased bolus cohesion;Delayed  oral transit  Oral - Thin  Oral - Thin Teaspoon Weak lingual manipulation;Lingual pumping;Incomplete tongue to palate  contact;Reduced posterior propulsion;Lingual/palatal residue;Decreased bolus cohesion;Premature spillage;Delayed oral transit  Oral - Solids  Oral - Mech Soft Impaired mastication;Weak lingual manipulation;Lingual pumping;Incomplete tongue to palate contact;Reduced posterior propulsion;Pocketing in anterior sulcus;Lingual/palatal residue;Delayed oral transit;Decreased bolus cohesion  Oral - Regular Impaired mastication;Weak lingual manipulation;Lingual pumping;Incomplete tongue to palate contact;Reduced posterior propulsion;Pocketing in anterior sulcus;Lingual/palatal residue;Delayed oral transit;Decreased bolus cohesion  Pharyngeal Phase  Pharyngeal Phase Impaired  Pharyngeal - Pudding  Pharyngeal- Pudding Teaspoon  (n/a, bolus removed)  Pharyngeal Material does not enter airway  Pharyngeal - Nectar  Pharyngeal- Nectar Teaspoon Delayed swallow initiation-pyriform sinuses;Reduced pharyngeal peristalsis;Reduced anterior laryngeal mobility;Reduced tongue base retraction;Penetration/Apiration after swallow;Pharyngeal residue - valleculae;Pharyngeal residue - pyriform  Pharyngeal Material enters airway, CONTACTS cords and then ejected out  Pharyngeal - Thin  Pharyngeal- Thin Teaspoon Delayed swallow initiation-pyriform sinuses;Reduced pharyngeal peristalsis;Reduced anterior laryngeal mobility;Reduced tongue base retraction;Penetration/Apiration after swallow;Pharyngeal residue - valleculae;Pharyngeal residue - pyriform  Pharyngeal Material enters airway, CONTACTS cords and then ejected out  Pharyngeal- Thin Cup Delayed swallow initiation-pyriform sinuses;Reduced pharyngeal peristalsis;Reduced anterior laryngeal mobility;Reduced tongue base retraction;Penetration/Apiration after swallow;Pharyngeal residue - valleculae;Pharyngeal residue - pyriform  Pharyngeal Material enters airway, CONTACTS cords and then ejected out  Pharyngeal - Solids  Pharyngeal- Mechanical Soft Delayed swallow initiation-pyriform  sinuses;Reduced pharyngeal peristalsis;Reduced anterior laryngeal mobility;Reduced tongue base retraction;Penetration/Apiration after swallow;Pharyngeal residue - valleculae;Pharyngeal residue - pyriform  Pharyngeal Other (Comment);Material enters airway, CONTACTS cords and then ejected out (Liquid (used to move solid), not solid penetrates)  Pharyngeal- Regular Delayed swallow initiation-pyriform sinuses;Reduced pharyngeal peristalsis;Reduced anterior laryngeal mobility;Reduced tongue base retraction;Penetration/Apiration after swallow;Pharyngeal residue - valleculae;Pharyngeal residue - pyriform  Pharyngeal Other (Comment);Material enters airway, CONTACTS cords and then ejected out (Liquid (used to move solid), not solid penetrates)  Cervical Esophageal Phase  Cervical Esophageal Phase Impaired  Cervical Esophageal Phase - Nectar  Nectar Teaspoon Reduced cricopharyngeal relaxation  Cervical Esophageal Phase - Thin  Thin Teaspoon Reduced cricopharyngeal relaxation  Thin Cup Reduced cricopharyngeal relaxation  Cervical Esophageal Phase - Solids  Mechanical Soft Reduced cricopharyngeal relaxation  Regular Reduced cricopharyngeal relaxation  Cervical Esophageal Phase - Comment  Cervical Esophageal Comment reduced amplitude/duration of pharyngoesophageal segment opening due to impaired hyolaryngeal excursion  Clinical Impression  Clinical Impression Patient presents with moderate to severe oral dysphagia and mild-moderate pharyngeal dysphagia. Oral stage is characterized by severely weak lingual manipulation and essentially passive anterior to posterior transfer (pt tilts head posteriorly to facilitate transit of bolus), reduced bolus cohesion, and moderate oral residue. Lingual movement is primarily limited to superior-inferior, with minimal anterior-posterior or lateral movement. This impacts bolus formation, transfer and oral clearance. Absence of rotary mastication; pt retains bolus in the  anterior oral cavity and uses a munching pattern to prepare bolus with front teeth, as well as mashing tip of tongue to the alveolar ridge. With solids, pt requires small amount of thin liquid to aid transfer to pharynx. The only texture pt was not able to manipulate/transfer was pudding (bolus removed with SLP assistance). Swallow initiation is delayed with prolonged pooling in the valleculae and pyriform sinuses. Due to base of tongue weakness, reduced hyolaryngeal excursion, and impaired pharyngeal constriction, there is residue that remains in the valleculae and pyriform sinuses post-swallow (mild with thin liquids, but increasing with thicker textures). Penetration of residue which spills from the pyriform sinuses into the laryngeal vestibule post-swallow occurs intermittently, however pt is able to fully eject penetration with throat clear or cough; there  is no aspiration. Pt is able to reduce residue with subsequent swallows (and liquid wash for solids). Limited trials to SMALL boluses (1.5cm x 1.5 cm) and small sips of liquid. Patient has cognitive abilities sufficient to follow verbal commands for this, and demonstrates slow, careful, thorough mastication. Requires 2-3 additional swallows and liquid wash to reduce pharyngeal residue of solid to a functional degree, and is able to maintain airway clearance with intermittent throat clearing as instructed. Given the level of effort required to manipulate, transfer, and clear boluses, patient is unlikely to be able to maintain adequate nutrition and hydration by oral means, especially considering potential for declining function with fatigue. However, he is encouraged to continue consuming preferred foods and liquids for comfort/pleasure in bite size amounts (1.5 cm by 1.5 cm or smaller, to reduce risk for choking and to facilitate efficient mastication/minimize residue), and discuss nutritional needs for primary nutrition via PEG with his dietitian, as well as  medications via PEG with pharmacy. Continue OP SLP for monitoring swallow function clinically as well as communication needs.   SLP Visit Diagnosis Dysphagia, oropharyngeal phase (R13.12);Dysphagia, pharyngoesophageal phase (R13.14)  Impact on safety and function Moderate aspiration risk  Swallow Evaluation Recommendations  Recommended Consults Other (Comment) (continue f/u with dietitian)  SLP Diet Recommendations Regular solids;Dysphagia 3 (Mech soft) solids;Thin liquid (bite-sized <1.5 cm)  Liquid Administration via Cup  Medication Administration Via alternative means  Supervision Patient able to self feed;Intermittent supervision to cue for compensatory strategies  Compensations Slow rate;Small sips/bites;Minimize environmental distractions;Multiple dry swallows after each bite/sip;Follow solids with liquid;Clear throat after each swallow;Hard cough after swallow  Postural Changes Seated upright at 90 degrees;Remain semi-upright after after feeds/meals (Comment)  Treatment Plan  Oral Care Recommendations Oral care before and after PO  Other Recommendations Have oral suction available  Treatment Recommendations Other (Comment) (continue OP SLP POC)  Follow Up Recommendations Outpatient SLP  Prognosis  Prognosis for Safe Diet Advancement Guarded  Barriers to Reach Goals Other (Comment) (progressive neuromuscular disease)  Individuals Consulted  Consulted and Agree with Results and Recommendations Patient;Family member/caregiver  Family Member Consulted spouse [name]  Progression Toward Goals  Progression toward goals Progressing toward goals  SLP Time Calculation  SLP Start Time (ACUTE ONLY) 1245  SLP Stop Time (ACUTE ONLY) 1345  SLP Time Calculation (min) (ACUTE ONLY) 60 min  SLP Evaluations  $ SLP Speech Visit 1 Visit  SLP Evaluations  $Outpatient MBS Swallow 1 Procedure  $Swallowing Treatment 1 Procedure    Patient will benefit from skilled therapeutic intervention in order  to improve the following deficits and impairments:   Dysphagia, unspecified type - Plan: DG SWALLOW FUNC OP MEDICARE SPEECH PATH, DG SWALLOW FUNC OP MEDICARE SPEECH PATH  ALS (amyotrophic lateral sclerosis) ([place]) - Plan: DG SWALLOW FUNC OP MEDICARE SPEECH PATH, [place], [place], [place] Speech-Language Pathologist [phone]    Problem List There are no problems to display for this patient.   Aliene Altes, CCC-SLP 11/15/2021, 10:36 AM  [place], [place], [address] Phone: [phone]   Fax:     Name: Alam Guterrez MRN: [id number] Date of Birth: 1950/08/13

## 2021-11-15 NOTE — Addendum Note (Signed)
Encounter addended by: Aliene Altes, CCC-SLP on: 11/15/2021 10:53 AM  Actions taken: Clinical Note Signed

## 2021-11-22 ENCOUNTER — Encounter: Payer: Medicare Other | Admitting: Speech Pathology

## 2021-12-06 ENCOUNTER — Ambulatory Visit: Payer: Medicare Other | Attending: Neurology | Admitting: Speech Pathology

## 2021-12-06 DIAGNOSIS — R1312 Dysphagia, oropharyngeal phase: Secondary | ICD-10-CM | POA: Diagnosis present

## 2021-12-06 DIAGNOSIS — R471 Dysarthria and anarthria: Secondary | ICD-10-CM | POA: Insufficient documentation

## 2021-12-06 NOTE — Therapy (Signed)
OUTPATIENT SPEECH LANGUAGE PATHOLOGY TREATMENT NOTE    Patient Name: Donald Gould MRN: [id number] DOB:[birth year], 71 y.o., male Today's Date: 12/06/2021  PCP: Derinda Late, MD REFERRING PROVIDER: Silvestre Moment, MD   End of Session - 12/06/21 1629     Visit Number 19    Number of Visits 24    Date for SLP Re-Evaluation 12/31/21    SLP Start Time 51    SLP Stop Time  1500    SLP Time Calculation (min) 60 min    Activity Tolerance Patient tolerated treatment well               Past Medical History:  Diagnosis Date   ALS (amyotrophic lateral sclerosis) ([place])    Environmental allergies    Past Surgical History:  Procedure Laterality Date   CATARACT EXTRACTION W/PHACO Right 07/02/2021   Procedure: CATARACT EXTRACTION PHACO AND INTRAOCULAR LENS PLACEMENT ([place]) RIGHT;  Surgeon: Leandrew Koyanagi, MD;  Location: [place];  Service: Ophthalmology;  Laterality: Right;  11.84 1:15.6   RETINAL DETACHMENT SURGERY Left 01/10/2021   [facility]   There are no problems to display for this patient.   ONSET DATE: 10/23/2020   referral date; pt reports gradual onset of speech symptoms beginning in 2020   REFERRING DIAG: speech disturbance, dysphagia   HPI: Patient is a 71 year old male with past medical history of TIA, elevated PSA, initially referred by neurologist in [facility] for slurred speech and dysphagia. 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 Patient had gradual speech changes beginning in 2020. This has been progressive, and patient began to notice difficulty with swallowing.  MBS performed 10/26/2020 with recommendation for regular/thin diet with aspiration precautions; residue was noted in the valleculae, pyriforms, and posterior  pharyngeal wall.  THERAPY DIAG:  Dysarthria and anarthria  Dysphagia, oropharyngeal phase  Rationale for Evaluation and Treatment Rehabilitation  SUBJECTIVE: [name] has questions re: swallowing and text-to-speech apps. Pt accompanied by:  Wife [name] PAIN:  Are you having pain? No     OBJECTIVE:   TODAY'S TREATMENT: Patient seen for skilled ST treatment targeting dysphagia and dysarthria goals. Spouse reports pt at times "gasping" intentionally at mealtimes (only at home, not when out to eat). Pt reports this is to help clear food from the back of his tongue. SLP reviewed MBS recommendations and strategies to reduce residue while maintaining airway protection (multiple swallows, cough or throat clear, reswallow). Educated that oral suctioning may help manage oral residue when swallowing; pt to bring his home device with him next session. Reinforced oral feeding with focus on comfort, pleasure and safety; pt now doing some tube feeds to supplement at home. Encouraged to discuss with dietitian re: intake recommendations via PEG. Spouse reports understanding most of what patient says at home; patient required min-mod cues to use text-to-speech app on his phone to clarify when communication breakdown occurred. Demonstrating saving/storing common messages to reduce response time. Worked with pt to [place] phone call with his brother and role-played this with spouse who stepped out of treatment room to take call. Pt required mod cues for this. Pt tearful near end of session when discussing future communication needs; provided listening and support.     PATIENT EDUCATION: Education details: use of text-to-speech app, compensatory strategies and swallowing recommendations Person educated: Patient and Spouse Education method: Explanation,  Demonstration, Verbal cues, and Handouts Education comprehension: verbalized understanding, verbal cues required, and needs further education   SLP Short  Term Goals - 03/13/21 1642       SLP SHORT TERM GOAL #1   Title Patient will demonstrate dysarthria HEP with modified independence.    Time 10    Period --   visits   Status Achieved      SLP SHORT TERM GOAL #2   Title Patient will use compensations for dysarthria for paragraph reading level 90% accuracy.    Time 10    Period Weeks    Status Achieved      SLP SHORT TERM GOAL #3   Title Patient will complete clinical swallowing assessment with goals added PRN    Time 2    Period Weeks    Status Achieved              SLP Long Term Goals - 03/13/21 1643       SLP LONG TERM GOAL #1   Title Patient will use compensations for dysarthria in 15 minutes mod complex conversation for greater than 95% intelligibility.    Time 12    Period Weeks    Status On-going      SLP LONG TERM GOAL #2   Title Patient will verbalize signs/symptoms of aspiration and aspiration pneumonia.    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 Intelligibility continues to decline; pt and spouse report some communication breakdowns at home. Pt receptive to using text-to-speech apps, continues to require mod cues for this in session today. Repeat MBS completed on 11/14/21; during evaluation pt able to maintain airway protection with use of compensations including multiple swallows and intermittent throat clear/cough with all consistencies, however demonstrates fatigue and requires significant effort to consume small amounts. Pt is now taking some medications and supplemental nutrition via PEG. Recommend comfort/pleasure feeds of thin liquids and soft solids with  precautions (see MBS report 11/14/21 for details). Will continue skilled ST at frequency of 1x every other week with focus on maximizing communicative and swallowing function, as well as education/training in augmentative/alternative communication.    Speech Therapy Frequency --   every other week   Duration 12 weeks    Treatment/Interventions Aspiration precaution training;Cueing hierarchy;SLP instruction and feedback;Pharyngeal strengthening exercises;Compensatory techniques;Functional tasks;Compensatory strategies;Diet toleration management by SLP;Trials of upgraded texture/liquids;Multimodal communcation approach;Internal/external aids;Patient/family education    Potential to Achieve Goals Fair    Potential Considerations Medical prognosis   progressive neurologic etiology   Consulted and Agree with Plan of Care Patient;Family member/caregiver    Family Member Consulted wife             Deneise Lever, [place], [place] Pathologist ([place], [place] 12/06/2021, 4:30 PM

## 2021-12-13 ENCOUNTER — Encounter: Payer: Self-pay | Admitting: Podiatry

## 2021-12-13 ENCOUNTER — Ambulatory Visit (INDEPENDENT_AMBULATORY_CARE_PROVIDER_SITE_OTHER): Payer: Medicare Other | Admitting: Podiatry

## 2021-12-13 DIAGNOSIS — M79674 Pain in right toe(s): Secondary | ICD-10-CM

## 2021-12-13 DIAGNOSIS — M79675 Pain in left toe(s): Secondary | ICD-10-CM | POA: Diagnosis not present

## 2021-12-13 DIAGNOSIS — B351 Tinea unguium: Secondary | ICD-10-CM | POA: Diagnosis not present

## 2021-12-13 NOTE — Progress Notes (Signed)
This patient returns to my office for at risk foot care.  This patient requires this care by a professional since this patient will be at risk due to having  ALS.  This patient is unable to cut nails himself since the patient cannot reach his nails.These nails are painful walking and wearing shoes.  This patient presents for at risk foot care today.  General Appearance  Alert, conversant and in no acute stress.  Vascular  Dorsalis pedis and posterior tibial  pulses are palpable  bilaterally.  Capillary return is within normal limits  bilaterally. Temperature is within normal limits  bilaterally.  Neurologic  Senn-Weinstein monofilament wire test within normal limits  bilaterally. Muscle power within normal limits bilaterally.  Nails Thick disfigured discolored nails with subungual debris  from hallux to fifth toes bilaterally. No evidence of bacterial infection or drainage bilaterally.  Orthopedic  No limitations of motion  feet .  No crepitus or effusions noted.  No bony pathology or digital deformities noted.  Skin  normotropic skin with no porokeratosis noted bilaterally.  No signs of infections or ulcers noted.     Onychomycosis  Pain in right toes  Pain in left toes  Consent was obtained for treatment procedures.   Mechanical debridement of nails 1-5  bilaterally performed with a nail nipper.  Filed with dremel without incident.    Return office visit   3 months                  Told patient to return for periodic foot care and evaluation due to potential at risk complications.   Gardiner Barefoot DPM

## 2021-12-20 ENCOUNTER — Ambulatory Visit: Payer: Medicare Other | Admitting: Speech Pathology

## 2022-01-03 ENCOUNTER — Ambulatory Visit: Payer: Medicare Other | Attending: Neurology | Admitting: Speech Pathology

## 2022-01-03 DIAGNOSIS — R471 Dysarthria and anarthria: Secondary | ICD-10-CM | POA: Insufficient documentation

## 2022-01-03 DIAGNOSIS — R1312 Dysphagia, oropharyngeal phase: Secondary | ICD-10-CM | POA: Diagnosis present

## 2022-01-03 NOTE — Therapy (Signed)
OUTPATIENT SPEECH LANGUAGE PATHOLOGY PROGRESS NOTE AND RECERTIFICATION   Patient Name: Donald Gould MRN: [id number] DOB:1950/12/23, 71 y.o., male Today's Date: 01/03/2022  Speech Therapy Progress Note  Dates of Reporting Period: 05/10/2021 to 01/03/2022   Objective: Patient has been seen for 10 speech therapy sessions this reporting period targeting dysphagia and dysarthria. Patient is making progress; he met 2/3 LTGs this reporting period. Goals revised and updated. See skilled intervention, clinical impressions, and goals below for details.   PCP: Derinda Late, MD REFERRING PROVIDER: Silvestre Moment, MD   End of Session - 01/03/22 1538     Visit Number 20    Number of Visits 27    Date for SLP Re-Evaluation 04/03/22    SLP Start Time 1400    SLP Stop Time  1510    SLP Time Calculation (min) 70 min    Activity Tolerance Patient tolerated treatment well               Past Medical History:  Diagnosis Date   ALS (amyotrophic lateral sclerosis) ([place])    Environmental allergies    Past Surgical History:  Procedure Laterality Date   CATARACT EXTRACTION W/PHACO Right 07/02/2021   Procedure: CATARACT EXTRACTION PHACO AND INTRAOCULAR LENS PLACEMENT ([place]) RIGHT;  Surgeon: Leandrew Koyanagi, MD;  Location: [place];  Service: Ophthalmology;  Laterality: Right;  11.84 1:15.6   RETINAL DETACHMENT SURGERY Left 01/10/2021   [facility]   There are no problems to display for this patient.   ONSET DATE: 10/23/2020   referral date; pt reports gradual onset of speech symptoms beginning in 2020   REFERRING DIAG: speech disturbance, dysphagia   HPI: Patient is a 71 year old male with past medical history of TIA, elevated PSA, initially referred by neurologist in [facility] for slurred speech and dysphagia. 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 Patient had gradual speech changes beginning in 2020. This has been progressive, and patient began to notice difficulty with swallowing.  Repeat MBS 11/14/21: "Patient presents with moderate to severe oral dysphagia and mild-moderate pharyngeal dysphagia. Oral stage is characterized by severely weak lingual manipulation and essentially passive anterior to posterior transfer (pt tilts head posteriorly to facilitate transit of bolus), reduced bolus cohesion, and moderate oral residue. Lingual movement is primarily limited to superior-inferior, with minimal anterior-posterior or lateral movement. This impacts bolus formation, transfer and oral clearance. Absence of rotary mastication; pt retains bolus in the anterior oral cavity and uses a munching pattern to prepare bolus with front teeth, as well as mashing tip of tongue to the alveolar ridge. With solids, pt requires small amount of thin liquid to aid transfer to pharynx. The only texture pt was not able to manipulate/transfer was pudding (bolus removed with SLP assistance). Swallow initiation is delayed with prolonged pooling in the valleculae and pyriform sinuses. Due to base of tongue weakness, reduced hyolaryngeal excursion, and impaired pharyngeal constriction, there is residue that remains in the valleculae and pyriform sinuses post-swallow (mild with thin liquids, but increasing with thicker textures). Penetration of residue which spills from the pyriform sinuses into the laryngeal vestibule post-swallow occurs intermittently, however pt is able to fully eject penetration with throat clear or cough; there is no aspiration. Pt is able to reduce residue with subsequent swallows (and liquid wash for solids). Limited trials to SMALL boluses (1.5cm  x 1.5 cm) and small sips of liquid. Patient has cognitive abilities sufficient to follow verbal commands for  this, and demonstrates slow, careful, thorough mastication. Requires 2-3 additional swallows and liquid wash to reduce pharyngeal residue of solid to a functional degree, and is able to maintain airway clearance with intermittent throat clearing as instructed. Given the level of effort required to manipulate, transfer, and clear boluses, patient is unlikely to be able to maintain adequate nutrition and hydration by oral means, especially considering potential for declining function with fatigue. However, he is encouraged to continue consuming preferred foods and liquids for comfort/pleasure in bite size amounts (1.5 cm by 1.5 cm or smaller, to reduce risk for choking and to facilitate efficient mastication/minimize residue), and discuss nutritional needs for primary nutrition via PEG with his dietitian, as well as medications via PEG with pharmacy. Continue OP SLP for monitoring swallow function clinically as well as communication needs."  THERAPY DIAG:  Dysarthria and anarthria  Dysphagia, oropharyngeal phase  Rationale for Evaluation and Treatment Rehabilitation  SUBJECTIVE: "We bought two of those" (pt's wife, re [employer]) Pt accompanied by:  Wife [name] PAIN:  Are you having pain? No   OBJECTIVE:   TODAY'S TREATMENT:  Spouse reports pt has been resistant to using phone apps for communication at home. SLP educated that even though high-tech options are available, the most effective AAC modality will be the one that pt is most comfortable with and willing to use. Targeted pt awareness of breakdowns with attention to facial expressions and by asking questions for clarification; pt required frequent min-mod cues to clarify by writing his response on Boogie Board. Spouse has ordered two for home. Pt at times looks to wife to interpret; reinforced that pt can be a competent communicator and can use other communication tools to relay his message. Encouraged spouse to prompt pt to use Boogie Board  if noting friends/family have difficulty comprehending [place].   [place] reports fatigue with eating, and that eating meals takes him a long time. SLP reinforced recommendation to take nutrition via his PEG (and to increase to recommended amount per dietitian). Pt stated it was hard to get out of the habit of eating meals; encouraged pt to take tube feedings at mealtimes and then consume small amounts at mealtimes for pleasure/ satiety. Explained MBS is snapshot of pt's function in time, and that as disease progresses, and with fatigue, pt's function will decline. Pt continues to require cues for multiple swallows when sipping thin liquids today; no overt s/sx aspiration observed, however anterior bolus loss occurred x2. Reinforced importance of oral care to reduce oral bacterial load prior to POs.     PATIENT EDUCATION: Education details: fatigue and disease progression impact safety and ability to swallow, recommendation is to use tube for primary nutrition with small amounts by mouth for pleasure/comfort Person educated: Patient and Spouse Education method: Explanation, Demonstration, Verbal cues, and Handouts Education comprehension: verbalized understanding, verbal cues required, and needs further education   SLP Short Term Goals - 03/13/21 1642       SLP SHORT TERM GOAL #1   Title Patient will demonstrate dysarthria HEP with modified independence.    Time 10    Period --   visits   Status Achieved      SLP SHORT TERM GOAL #2   Title Patient will use compensations for dysarthria for paragraph reading level 90% accuracy.    Time 10    Period Weeks    Status Achieved  SLP SHORT TERM GOAL #3   Title Patient will complete clinical swallowing assessment with goals added PRN    Time 2    Period Weeks    Status Achieved               SLP Long Term Goals - 03/13/21 1643       SLP LONG TERM GOAL #1   Title Pt will attempt repair of communication breakdowns using multimodal  communication/AAC in 75% of opportunities.   Time 12    Period Weeks    Status Revised     SLP LONG TERM GOAL #2   Title Patient will verbalize signs/symptoms of aspiration and aspiration pneumonia.    Time 12    Period Weeks    Status Met     SLP LONG TERM GOAL #3   Title Patient will demonstrate knowledge of external aids/apps for augmentative/alternative communication    Time 12    Period Weeks    Status Met   SLP LONG TERM GOAL #4  Title Patient will utilize recommended precautions while consuming pleasure feeds with min cues.  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 Intelligibility continues to decline. Spouse ordered Boogie Boards to aid communication. Despite being receptive to using text-to-speech apps in session, pt expressed reluctance to do this at home. He has been more receptive to using writing as backup mode of communication. Repeat MBS completed on 11/14/21; during evaluation pt able to maintain airway protection with use of compensations including multiple swallows and intermittent throat clear/cough with all consistencies, however demonstrates fatigue and requires significant effort to consume small amounts. Despite recommendations for small amounts of foods for comfort, he continues to consume full meals but states it is burdensome and requires extended time to eat. SLP reinforced dietitian recommendation (pt currently taking only 3-4 bottles per day). Recommend comfort/pleasure feeds of thin liquids and soft solids with precautions (see MBS report 11/14/21 for details). Will continue skilled ST at frequency of 1x every other week with focus on maximizing communicative and swallowing function, as well as education/training in augmentative/alternative communication.    Speech Therapy Frequency --   every other week    Duration 12 weeks    Treatment/Interventions Aspiration precaution training;Cueing hierarchy;SLP instruction and feedback;Pharyngeal strengthening exercises;Compensatory techniques;Functional tasks;Compensatory strategies;Diet toleration management by SLP;Trials of upgraded texture/liquids;Multimodal communcation approach;Internal/external aids;Patient/family education    Potential to Achieve Goals Fair    Potential Considerations Medical prognosis   progressive neurologic etiology   Consulted and Agree with Plan of Care Patient;Family member/caregiver    Family Member Consulted wife             Deneise Lever, [place], [place] Pathologist ([place], [place] 01/03/2022, 3:40 PM

## 2022-01-17 ENCOUNTER — Ambulatory Visit: Payer: Medicare Other | Attending: Neurology | Admitting: Speech Pathology

## 2022-01-17 DIAGNOSIS — R471 Dysarthria and anarthria: Secondary | ICD-10-CM | POA: Insufficient documentation

## 2022-01-17 DIAGNOSIS — R1312 Dysphagia, oropharyngeal phase: Secondary | ICD-10-CM | POA: Insufficient documentation

## 2022-01-17 NOTE — Therapy (Signed)
OUTPATIENT SPEECH LANGUAGE PATHOLOGY TREATMENT NOTE   Patient Name: Donald Gould MRN: [id number] DOB:1951-01-19, 71 y.o., male Today's Date: 01/17/2022   PCP: Derinda Late, MD REFERRING PROVIDER: Silvestre Moment, MD   End of Session - 01/17/22 1520     Visit Number 21    Number of Visits 27    Date for SLP Re-Evaluation 04/03/22    SLP Start Time 1400    SLP Stop Time  1500    SLP Time Calculation (min) 60 min    Activity Tolerance Patient tolerated treatment well               Past Medical History:  Diagnosis Date   ALS (amyotrophic lateral sclerosis) ([place])    Environmental allergies    Past Surgical History:  Procedure Laterality Date   CATARACT EXTRACTION W/PHACO Right 07/02/2021   Procedure: CATARACT EXTRACTION PHACO AND INTRAOCULAR LENS PLACEMENT ([place]) RIGHT;  Surgeon: Leandrew Koyanagi, MD;  Location: [facility];  Service: Ophthalmology;  Laterality: Right;  11.84 1:15.6   RETINAL DETACHMENT SURGERY Left 01/10/2021   [facility]   There are no problems to display for this patient.   ONSET DATE: 10/23/2020   referral date; pt reports gradual onset of speech symptoms beginning in 2020   REFERRING DIAG: speech disturbance, dysphagia   HPI: Patient is a 71 year old male with past medical history of TIA, elevated PSA, initially referred by neurologist in [facility] for slurred speech and dysphagia. 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 Patient had gradual speech changes beginning in 2020. This has been progressive, and patient began to notice difficulty with swallowing.  Repeat MBS 11/14/21: "Patient presents with moderate to severe oral dysphagia and mild-moderate pharyngeal dysphagia. Oral stage is characterized by severely weak  lingual manipulation and essentially passive anterior to posterior transfer (pt tilts head posteriorly to facilitate transit of bolus), reduced bolus cohesion, and moderate oral residue. Lingual movement is primarily limited to superior-inferior, with minimal anterior-posterior or lateral movement. This impacts bolus formation, transfer and oral clearance. Absence of rotary mastication; pt retains bolus in the anterior oral cavity and uses a munching pattern to prepare bolus with front teeth, as well as mashing tip of tongue to the alveolar ridge. With solids, pt requires small amount of thin liquid to aid transfer to pharynx. The only texture pt was not able to manipulate/transfer was pudding (bolus removed with SLP assistance). Swallow initiation is delayed with prolonged pooling in the valleculae and pyriform sinuses. Due to base of tongue weakness, reduced hyolaryngeal excursion, and impaired pharyngeal constriction, there is residue that remains in the valleculae and pyriform sinuses post-swallow (mild with thin liquids, but increasing with thicker textures). Penetration of residue which spills from the pyriform sinuses into the laryngeal vestibule post-swallow occurs intermittently, however pt is able to fully eject penetration with throat clear or cough; there is no aspiration. Pt is able to reduce residue with subsequent swallows (and liquid wash for solids). Limited trials to SMALL boluses (1.5cm x 1.5 cm) and small sips of liquid. Patient has cognitive abilities sufficient to follow verbal commands for this, and demonstrates slow, careful, thorough mastication. Requires 2-3 additional swallows and liquid wash to reduce pharyngeal residue of solid to a functional degree, and is able to maintain airway clearance with intermittent throat clearing as instructed. Given the level of  effort required to manipulate, transfer, and clear boluses, patient is unlikely to be able to maintain adequate nutrition and  hydration by oral means, especially considering potential for declining function with fatigue. However, he is encouraged to continue consuming preferred foods and liquids for comfort/pleasure in bite size amounts (1.5 cm by 1.5 cm or smaller, to reduce risk for choking and to facilitate efficient mastication/minimize residue), and discuss nutritional needs for primary nutrition via PEG with his dietitian, as well as medications via PEG with pharmacy. Continue OP SLP for monitoring swallow function clinically as well as communication needs."  THERAPY DIAG:  Dysarthria and anarthria  Dysphagia, oropharyngeal phase  Rationale for Evaluation and Treatment Rehabilitation  SUBJECTIVE: Patient reports using boogie board on outings including drug store and MD appt Pt accompanied by:  Wife [name] PAIN:  Are you having pain? No   OBJECTIVE:   TODAY'S TREATMENT:  Patient initiating use of boogie board today as primary mode of communication. When communicating verbally, patient attended to facial expressions to monitor for communication breakdowns and independently initiated repair using his boogie board. Emotional lability noted today (both crying and laughing). [name] continues to eat 3 meals per day, and reports taking 3 bottles of supplement via his PEG. Continue to educate on recommendations (pleasure foods with primary nutrition/hydration via PEG) as well as aspiration risks and precautions. Patient to bring preferred foods next session for assessment of tolerance and training in swallowing compensations.     PATIENT EDUCATION: Education details: fatigue and disease progression impact safety and ability to swallow, recommendation is to use tube for primary nutrition with small amounts by mouth for pleasure/comfort Person educated: Patient and Spouse Education method: Explanation, Demonstration, Verbal cues, and Handouts Education comprehension: verbalized understanding, verbal cues required, and  needs further education   SLP Short Term Goals - 03/13/21 1642       SLP SHORT TERM GOAL #1   Title Patient will demonstrate dysarthria HEP with modified independence.    Time 10    Period --   visits   Status Achieved      SLP SHORT TERM GOAL #2   Title Patient will use compensations for dysarthria for paragraph reading level 90% accuracy.    Time 10    Period Weeks    Status Achieved      SLP SHORT TERM GOAL #3   Title Patient will complete clinical swallowing assessment with goals added PRN    Time 2    Period Weeks    Status Achieved               SLP Long Term Goals - 03/13/21 1643       SLP LONG TERM GOAL #1   Title Pt will attempt repair of communication breakdowns using multimodal communication/AAC in 75% of opportunities.   Time 12    Period Weeks    Status Revised     SLP LONG TERM GOAL #2   Title Patient will verbalize signs/symptoms of aspiration and aspiration pneumonia.    Time 12    Period Weeks    Status Met     SLP LONG TERM GOAL #3   Title Patient will demonstrate knowledge of external aids/apps for augmentative/alternative communication    Time 12    Period Weeks    Status Met   SLP LONG TERM GOAL #4  Title Patient will utilize recommended precautions while consuming pleasure feeds with min cues.  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 Intelligibility continues to decline. [name] is now using [employer] consistently for communication. Repeat MBS completed on 11/14/21; during evaluation pt able to maintain airway protection with use of compensations including multiple swallows and intermittent throat clear/cough with all consistencies, however demonstrates fatigue and requires significant effort to consume small amounts. Despite recommendations for small amounts of foods for  comfort, he continues to consume full meals but states it is burdensome and requires extended time to eat. Continue to educate pt on recommendations, aspiration risk and precautions, and support dietitian's recommendations. Communication appears stable and functional with use of boogie board; will focus on education/ training on swallowing function and minimizing aspiration risk; will consider decreasing frequency to 1x per month after next visit.   Speech Therapy Frequency --   every other week   Duration 12 weeks    Treatment/Interventions Aspiration precaution training;Cueing hierarchy;SLP instruction and feedback;Pharyngeal strengthening exercises;Compensatory techniques;Functional tasks;Compensatory strategies;Diet toleration management by SLP;Trials of upgraded texture/liquids;Multimodal communcation approach;Internal/external aids;Patient/family education    Potential to Achieve Goals Fair    Potential Considerations Medical prognosis   progressive neurologic etiology   Consulted and Agree with Plan of Care Patient;Family member/caregiver    Family Member Consulted wife             Deneise Lever, [place], [place] Speech-Language Pathologist ([place], [place] 01/17/2022, 3:22 PM

## 2022-01-31 ENCOUNTER — Ambulatory Visit: Payer: Medicare Other | Admitting: Speech Pathology

## 2022-01-31 DIAGNOSIS — R471 Dysarthria and anarthria: Secondary | ICD-10-CM | POA: Diagnosis not present

## 2022-01-31 DIAGNOSIS — R1312 Dysphagia, oropharyngeal phase: Secondary | ICD-10-CM

## 2022-02-04 NOTE — Therapy (Signed)
OUTPATIENT SPEECH LANGUAGE PATHOLOGY TREATMENT NOTE   Patient Name: Donald Gould MRN: [id number] DOB:08-Jan-1951, 71 y.o., male Today's Date: 02/04/2022   PCP: Derinda Late, MD REFERRING PROVIDER: Silvestre Moment, MD   End of Session - 02/04/22 0858     Visit Number 22    Number of Visits 27    Date for SLP Re-Evaluation 04/03/22    SLP Start Time 27    SLP Stop Time  1500    SLP Time Calculation (min) 60 min    Activity Tolerance Patient tolerated treatment well               Past Medical History:  Diagnosis Date   ALS (amyotrophic lateral sclerosis) ([place])    Environmental allergies    Past Surgical History:  Procedure Laterality Date   CATARACT EXTRACTION W/PHACO Right 07/02/2021   Procedure: CATARACT EXTRACTION PHACO AND INTRAOCULAR LENS PLACEMENT ([place]) RIGHT;  Surgeon: Leandrew Koyanagi, MD;  Location: [facility];  Service: Ophthalmology;  Laterality: Right;  11.84 1:15.6   RETINAL DETACHMENT SURGERY Left 01/10/2021   [facility]   There are no problems to display for this patient.   ONSET DATE: 10/23/2020   referral date; pt reports gradual onset of speech symptoms beginning in 2020   REFERRING DIAG: speech disturbance, dysphagia   HPI: Patient is a 71 year old male with past medical history of TIA, elevated PSA, initially referred by neurologist in [facility] for slurred speech and dysphagia. 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 Patient had gradual speech changes beginning in 2020. This has been progressive, and patient began to notice difficulty with swallowing.  Repeat MBS 11/14/21: "Patient presents with moderate to severe oral dysphagia and mild-moderate pharyngeal dysphagia. Oral stage is characterized by severely weak  lingual manipulation and essentially passive anterior to posterior transfer (pt tilts head posteriorly to facilitate transit of bolus), reduced bolus cohesion, and moderate oral residue. Lingual movement is primarily limited to superior-inferior, with minimal anterior-posterior or lateral movement. This impacts bolus formation, transfer and oral clearance. Absence of rotary mastication; pt retains bolus in the anterior oral cavity and uses a munching pattern to prepare bolus with front teeth, as well as mashing tip of tongue to the alveolar ridge. With solids, pt requires small amount of thin liquid to aid transfer to pharynx. The only texture pt was not able to manipulate/transfer was pudding (bolus removed with SLP assistance). Swallow initiation is delayed with prolonged pooling in the valleculae and pyriform sinuses. Due to base of tongue weakness, reduced hyolaryngeal excursion, and impaired pharyngeal constriction, there is residue that remains in the valleculae and pyriform sinuses post-swallow (mild with thin liquids, but increasing with thicker textures). Penetration of residue which spills from the pyriform sinuses into the laryngeal vestibule post-swallow occurs intermittently, however pt is able to fully eject penetration with throat clear or cough; there is no aspiration. Pt is able to reduce residue with subsequent swallows (and liquid wash for solids). Limited trials to SMALL boluses (1.5cm x 1.5 cm) and small sips of liquid. Patient has cognitive abilities sufficient to follow verbal commands for this, and demonstrates slow, careful, thorough mastication. Requires 2-3 additional swallows and liquid wash to reduce pharyngeal residue of solid to a functional degree, and is able to maintain airway clearance with intermittent throat clearing as instructed. Given the level of  effort required to manipulate, transfer, and clear boluses, patient is unlikely to be able to maintain adequate nutrition and  hydration by oral means, especially considering potential for declining function with fatigue. However, he is encouraged to continue consuming preferred foods and liquids for comfort/pleasure in bite size amounts (1.5 cm by 1.5 cm or smaller, to reduce risk for choking and to facilitate efficient mastication/minimize residue), and discuss nutritional needs for primary nutrition via PEG with his dietitian, as well as medications via PEG with pharmacy. Continue OP SLP for monitoring swallow function clinically as well as communication needs."  THERAPY DIAG:  Dysarthria and anarthria  Dysphagia, oropharyngeal phase  Rationale for Evaluation and Treatment Rehabilitation  SUBJECTIVE: Wife reported pt at times gasps when eating Pt accompanied by:  Wife [name] PAIN:  Are you having pain? No   OBJECTIVE:   TODAY'S TREATMENT:  SLP continues to educate on disease progression and overt s/sx aspiration. Explained choking risk given pt's limited ability to masticate and manipulate solids, as well as signs of worsening pharyngeal dysphagia. Explained various choices and risks involved when managing dysphagia with high aspiration risk. Explained options including NPO with all nutrition/hydration via PEG, use of PEG with small amounts of pleasure foods and liquids (texture modification to puree or dysphagia 2 to reduce choking risk), or self-selected diet (and choking/aspiration risks this presents). Patient continues to elect to consume full meals three times per day, and has not increased his tube feedings. When sipping liquids, pt is tilting head back (neck in extension) to facilitate oral transfer, increasing potential for spillage into the airway. He exhibited cough x1/10 attempts while sipping water. Trained pt in use of portable oral suction between swallows to clear residue from oral cavity. Given pt's choice to continue consuming full meals: reinforced slow rate, small bites/sips, use of multiple swallows  and intermittent throat clearing/reswallow. Strongly encouraged pt to consider texture modification and provided visual to educate pt on the size of his airway.      PATIENT EDUCATION: Education details: fatigue and disease progression impact safety and ability to swallow, aspiration risks Person educated: Patient and Spouse Education method: Explanation, Demonstration, Verbal cues, and Handouts Education comprehension: verbalized understanding, verbal cues required, and needs further education   SLP Short Term Goals - 03/13/21 1642       SLP SHORT TERM GOAL #1   Title Patient will demonstrate dysarthria HEP with modified independence.    Time 10    Period --   visits   Status Achieved      SLP SHORT TERM GOAL #2   Title Patient will use compensations for dysarthria for paragraph reading level 90% accuracy.    Time 10    Period Weeks    Status Achieved      SLP SHORT TERM GOAL #3   Title Patient will complete clinical swallowing assessment with goals added PRN    Time 2    Period Weeks    Status Achieved               SLP Long Term Goals - 03/13/21 1643       SLP LONG TERM GOAL #1   Title Pt will attempt repair of communication breakdowns using multimodal communication/AAC in 75% of opportunities.   Time 12    Period Weeks    Status Revised     SLP LONG TERM GOAL #2   Title Patient will verbalize signs/symptoms of aspiration and aspiration pneumonia.    Time 12  Period Weeks    Status Met     SLP LONG TERM GOAL #3   Title Patient will demonstrate knowledge of external aids/apps for augmentative/alternative communication    Time 12    Period Weeks    Status Met   SLP LONG TERM GOAL #4  Title Patient will utilize recommended precautions while consuming pleasure feeds with min cues.  Time 12   Period Weeks   Status New             Plan - 10/11/21 1656     Clinical Impression Statement Patient with severe mixed flaccid-spastic dysarthria and  oropharyngeal dysphagia secondary to ALS (dx 03/27/21). Intelligibility continues to decline, though somewhat improved compared with previous session, due to less saliva after botox given last week. [name] is now using [employer] consistently for communication. Repeat MBS completed on 11/14/21; during evaluation pt able to maintain airway protection with use of compensations including multiple swallows and intermittent throat clear/cough with all consistencies, however demonstrates fatigue and requires significant effort to consume small amounts. Since MBS, shows clinically signs of progressing oropharyngeal dysphagia. Pt continues to elect to consume full meals but states it is burdensome and requires extended time to eat. Continue to educate pt on aspiration risk and precautions, and support dietitian's recommendations to increase tube feedings. Continue to educate that pt is at high risk for choking/aspiration. Communication appears stable and functional with use of boogie board; will focus on education/ training on swallowing function and minimizing aspiration risk; will consider decreasing frequency to 1x per month after next visit.   Speech Therapy Frequency --   every other week   Duration 12 weeks    Treatment/Interventions Aspiration precaution training;Cueing hierarchy;SLP instruction and feedback;Pharyngeal strengthening exercises;Compensatory techniques;Functional tasks;Compensatory strategies;Diet toleration management by SLP;Trials of upgraded texture/liquids;Multimodal communcation approach;Internal/external aids;Patient/family education    Potential to Achieve Goals Fair    Potential Considerations Medical prognosis   progressive neurologic etiology   Consulted and Agree with Plan of Care Patient;Family member/caregiver    Family Member Consulted wife             Deneise Lever, [place], [place] Speech-Language Pathologist ([place], [place] 02/04/2022, 8:59 AM

## 2022-02-14 ENCOUNTER — Ambulatory Visit: Payer: Medicare Other | Admitting: Speech Pathology

## 2022-02-14 DIAGNOSIS — R471 Dysarthria and anarthria: Secondary | ICD-10-CM

## 2022-02-14 DIAGNOSIS — R1312 Dysphagia, oropharyngeal phase: Secondary | ICD-10-CM

## 2022-02-14 NOTE — Therapy (Signed)
OUTPATIENT SPEECH LANGUAGE PATHOLOGY TREATMENT NOTE   Patient Name: Donald Gould MRN: [id number] DOB:1950/09/09, 71 y.o., male Today's Date: 02/14/2022   PCP: Derinda Late, MD REFERRING PROVIDER: Silvestre Moment, MD   End of Session - 02/14/22 [place]     Visit Number 23    Number of Visits 27    Date for SLP Re-Evaluation 04/03/22    SLP Start Time 1400    SLP Stop Time  1500    SLP Time Calculation (min) 60 min    Activity Tolerance Patient tolerated treatment well               Past Medical History:  Diagnosis Date   ALS (amyotrophic lateral sclerosis) ([place])    Environmental allergies    Past Surgical History:  Procedure Laterality Date   CATARACT EXTRACTION W/PHACO Right 07/02/2021   Procedure: CATARACT EXTRACTION PHACO AND INTRAOCULAR LENS PLACEMENT ([place]) RIGHT;  Surgeon: Leandrew Koyanagi, MD;  [facility];  Service: Ophthalmology;  Laterality: Right;  11.84 1:15.6   RETINAL DETACHMENT SURGERY Left 01/10/2021   [facility]   There are no problems to display for this patient.   ONSET DATE: 10/23/2020   referral date; pt reports gradual onset of speech symptoms beginning in 2020   REFERRING DIAG: speech disturbance, dysphagia   HPI: Patient is a 71 year old male with past medical history of TIA, elevated PSA, initially referred by neurologist in [facility] for slurred speech and dysphagia. 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 Patient had gradual speech changes beginning in 2020. This has been progressive, and patient began to notice difficulty with swallowing.  Repeat MBS 11/14/21: "Patient presents with moderate to severe oral dysphagia and mild-moderate pharyngeal dysphagia. Oral stage is characterized by severely weak  lingual manipulation and essentially passive anterior to posterior transfer (pt tilts head posteriorly to facilitate transit of bolus), reduced bolus cohesion, and moderate oral residue. Lingual movement is primarily limited to superior-inferior, with minimal anterior-posterior or lateral movement. This impacts bolus formation, transfer and oral clearance. Absence of rotary mastication; pt retains bolus in the anterior oral cavity and uses a munching pattern to prepare bolus with front teeth, as well as mashing tip of tongue to the alveolar ridge. With solids, pt requires small amount of thin liquid to aid transfer to pharynx. The only texture pt was not able to manipulate/transfer was pudding (bolus removed with SLP assistance). Swallow initiation is delayed with prolonged pooling in the valleculae and pyriform sinuses. Due to base of tongue weakness, reduced hyolaryngeal excursion, and impaired pharyngeal constriction, there is residue that remains in the valleculae and pyriform sinuses post-swallow (mild with thin liquids, but increasing with thicker textures). Penetration of residue which spills from the pyriform sinuses into the laryngeal vestibule post-swallow occurs intermittently, however pt is able to fully eject penetration with throat clear or cough; there is no aspiration. Pt is able to reduce residue with subsequent swallows (and liquid wash for solids). Limited trials to SMALL boluses (1.5cm x 1.5 cm) and small sips of liquid. Patient has cognitive abilities sufficient to follow verbal commands for this, and demonstrates slow, careful, thorough mastication. Requires 2-3 additional swallows and liquid wash to reduce pharyngeal residue of solid to a functional degree, and is able to maintain airway clearance with intermittent throat clearing as instructed. Given the level of  effort required to manipulate, transfer, and clear boluses, patient is unlikely to be able to maintain adequate nutrition and  hydration by oral means, especially considering potential for declining function with fatigue. However, he is encouraged to continue consuming preferred foods and liquids for comfort/pleasure in bite size amounts (1.5 cm by 1.5 cm or smaller, to reduce risk for choking and to facilitate efficient mastication/minimize residue), and discuss nutritional needs for primary nutrition via PEG with his dietitian, as well as medications via PEG with pharmacy. Continue OP SLP for monitoring swallow function clinically as well as communication needs."  THERAPY DIAG:  Dysarthria and anarthria  Dysphagia, oropharyngeal phase  Rationale for Evaluation and Treatment Rehabilitation  SUBJECTIVE: "I think it won't be long until I only use the tube." Pt accompanied by:  Wife [name] PAIN:  Are you having pain? No   OBJECTIVE:   TODAY'S TREATMENT:  Pt reports feeling overwhelmed. Spouse states she has to remind pt to use his boogie board at home; he is not regularly communicating about his medical questions and needs. Pt wrote these (unprompted). Initiated list for pt to begin writing his medical needs/concerns and encouraged pt/spouse to set time daily, even if brief, for conversation using his boogie board. Encouraged pt/spouse to discuss pt's questions re: tube feedings and bowel movement frequency with dietitian, neurologist at [facility]. He has increased tube feeds to 4x daily, reports it is taking him too long to chew. Continue to reinforce aspiration risks/precautions and encourage altering consistency for pleasure feeds      PATIENT EDUCATION: Education details: fatigue and disease progression impact safety and ability to swallow, aspiration risks Person educated: Patient and Spouse Education method: Explanation, Demonstration, Verbal cues, and Handouts Education comprehension: verbalized understanding, verbal cues required, and needs further education   SLP Short Term Goals - 03/13/21 [place] #1   Title Patient will demonstrate dysarthria HEP with modified independence.    Time 10    Period --   visits   Status Achieved      SLP SHORT TERM GOAL #2   Title Patient will use compensations for dysarthria for paragraph reading level 90% accuracy.    Time 10    Period Weeks    Status Achieved      SLP SHORT TERM GOAL #3   Title Patient will complete clinical swallowing assessment with goals added PRN    Time 2    Period Weeks    Status Achieved               SLP Long Term Goals - 03/13/21 1643       SLP LONG TERM GOAL #1   Title Pt will attempt repair of communication breakdowns using multimodal communication/AAC in 75% of opportunities.   Time 12    Period Weeks    Status Revised     SLP LONG TERM GOAL #2   Title Patient will verbalize signs/symptoms of aspiration and aspiration pneumonia.    Time 12    Period Weeks    Status Met     SLP LONG TERM GOAL #3   Title Patient will demonstrate knowledge of external aids/apps for augmentative/alternative communication    Time 12    Period Weeks    Status Met   SLP LONG TERM GOAL #4  Title Patient will utilize recommended precautions while consuming pleasure feeds with min cues.  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 [name] is now using [employer] consistently for communication in sessions, but requires cues from spouse to do so at home. He states he prefers this to text-to-speech apps trialed during previous sessions. Repeat MBS completed on 11/14/21; during evaluation pt able to maintain airway protection with use of compensations including multiple swallows and intermittent throat clear/cough with all consistencies, however demonstrates fatigue and requires significant effort to consume small amounts. Since MBS,  shows clinically signs of progressing oropharyngeal dysphagia. Pt continues to elect to consume full meals but states it is burdensome and requires extended time to eat. Continue to educate pt on aspiration risk and precautions, and support dietitian's recommendations to increase tube feedings. Continue to educate that pt is at high risk for choking/aspiration. Pt and spouse in agreement to decrease frequency to 1x per month. Should communication needs remain stable, will consider d/c, with potential to resume therapy should pt have need and interest in utilizing another communication modality.    Speech Therapy Frequency --   every other week   Duration 12 weeks    Treatment/Interventions Aspiration precaution training;Cueing hierarchy;SLP instruction and feedback;Pharyngeal strengthening exercises;Compensatory techniques;Functional tasks;Compensatory strategies;Diet toleration management by SLP;Trials of upgraded texture/liquids;Multimodal communcation approach;Internal/external aids;Patient/family education    Potential to Achieve Goals Fair    Potential Considerations Medical prognosis   progressive neurologic etiology   Consulted and Agree with Plan of Care Patient;Family member/caregiver    Family Member Consulted wife             Deneise Lever, [place], [place] Speech-Language Pathologist ([place], [place] 02/14/2022, 6:27 PM

## 2022-02-28 ENCOUNTER — Ambulatory Visit: Payer: Medicare Other | Admitting: Speech Pathology

## 2022-02-28 ENCOUNTER — Ambulatory Visit (INDEPENDENT_AMBULATORY_CARE_PROVIDER_SITE_OTHER): Payer: Medicare Other | Admitting: Podiatry

## 2022-02-28 ENCOUNTER — Encounter: Payer: Self-pay | Admitting: Podiatry

## 2022-02-28 DIAGNOSIS — M79675 Pain in left toe(s): Secondary | ICD-10-CM | POA: Diagnosis not present

## 2022-02-28 DIAGNOSIS — M79674 Pain in right toe(s): Secondary | ICD-10-CM

## 2022-02-28 DIAGNOSIS — B351 Tinea unguium: Secondary | ICD-10-CM

## 2022-02-28 NOTE — Progress Notes (Signed)
This patient returns to my office for at risk foot care.  This patient requires this care by a professional since this patient will be at risk due to having  ALS.  This patient is unable to cut nails himself since the patient cannot reach his nails.These nails are painful walking and wearing shoes.  This patient presents for at risk foot care today.  General Appearance  Alert, conversant and in no acute stress.  Vascular  Dorsalis pedis and posterior tibial  pulses are palpable  bilaterally.  Capillary return is within normal limits  bilaterally. Temperature is within normal limits  bilaterally.  Neurologic  Senn-Weinstein monofilament wire test within normal limits  bilaterally. Muscle power within normal limits bilaterally.  Nails Thick disfigured discolored nails with subungual debris  from hallux to fifth toes bilaterally. No evidence of bacterial infection or drainage bilaterally.  Orthopedic  No limitations of motion  feet .  No crepitus or effusions noted.  No bony pathology or digital deformities noted.  Skin  normotropic skin with no porokeratosis noted bilaterally.  No signs of infections or ulcers noted.     Onychomycosis  Pain in right toes  Pain in left toes  Consent was obtained for treatment procedures.   Mechanical debridement of nails 1-5  bilaterally performed with a nail nipper.  Filed with dremel without incident.    Return office visit   10 weeks                Told patient to return for periodic foot care and evaluation due to potential at risk complications.   Gardiner Barefoot DPM

## 2022-03-14 ENCOUNTER — Ambulatory Visit: Payer: Medicare Other | Attending: Neurology | Admitting: Speech Pathology

## 2022-03-14 DIAGNOSIS — R471 Dysarthria and anarthria: Secondary | ICD-10-CM | POA: Diagnosis present

## 2022-03-14 DIAGNOSIS — R1312 Dysphagia, oropharyngeal phase: Secondary | ICD-10-CM | POA: Insufficient documentation

## 2022-03-14 NOTE — Therapy (Signed)
OUTPATIENT SPEECH LANGUAGE PATHOLOGY TREATMENT NOTE   Patient Name: Donald Gould MRN: [id number] DOB:09-12-1950, 71 y.o., male Today's Date: 03/14/2022   PCP: Derinda Late, MD REFERRING PROVIDER: Silvestre Moment, MD/ now under care of Ronne Binning, MD    End of Session - 03/14/22 1622     Visit Number 24    Number of Visits 27    Date for SLP Re-Evaluation 04/03/22    SLP Start Time 36    SLP Stop Time  1500    SLP Time Calculation (min) 60 min    Activity Tolerance Patient tolerated treatment well               Past Medical History:  Diagnosis Date   ALS (amyotrophic lateral sclerosis) ([place])    Environmental allergies    Past Surgical History:  Procedure Laterality Date   CATARACT EXTRACTION W/PHACO Right 07/02/2021   Procedure: CATARACT EXTRACTION PHACO AND INTRAOCULAR LENS PLACEMENT ([place]) RIGHT;  Surgeon: Leandrew Koyanagi, MD;  Location: [place];  Service: Ophthalmology;  Laterality: Right;  11.84 1:15.6   RETINAL DETACHMENT SURGERY Left 01/10/2021   [facility]   There are no problems to display for this patient.   ONSET DATE: 10/23/2020   referral date; pt reports gradual onset of speech symptoms beginning in 2020   REFERRING DIAG: speech disturbance, dysphagia   HPI: Patient is a 71 year old male with past medical history of TIA, elevated PSA, initially referred by neurologist in [facility] for slurred speech and dysphagia. 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 Patient had gradual speech changes beginning in 2020. This has been progressive, and patient began to notice difficulty with swallowing.  Repeat MBS 11/14/21: "Patient presents with moderate to severe oral dysphagia and mild-moderate pharyngeal dysphagia. Oral  stage is characterized by severely weak lingual manipulation and essentially passive anterior to posterior transfer (pt tilts head posteriorly to facilitate transit of bolus), reduced bolus cohesion, and moderate oral residue. Lingual movement is primarily limited to superior-inferior, with minimal anterior-posterior or lateral movement. This impacts bolus formation, transfer and oral clearance. Absence of rotary mastication; pt retains bolus in the anterior oral cavity and uses a munching pattern to prepare bolus with front teeth, as well as mashing tip of tongue to the alveolar ridge. With solids, pt requires small amount of thin liquid to aid transfer to pharynx. The only texture pt was not able to manipulate/transfer was pudding (bolus removed with SLP assistance). Swallow initiation is delayed with prolonged pooling in the valleculae and pyriform sinuses. Due to base of tongue weakness, reduced hyolaryngeal excursion, and impaired pharyngeal constriction, there is residue that remains in the valleculae and pyriform sinuses post-swallow (mild with thin liquids, but increasing with thicker textures). Penetration of residue which spills from the pyriform sinuses into the laryngeal vestibule post-swallow occurs intermittently, however pt is able to fully eject penetration with throat clear or cough; there is no aspiration. Pt is able to reduce residue with subsequent swallows (and liquid wash for solids). Limited trials to SMALL boluses (1.5cm x 1.5 cm) and small sips of liquid. Patient has cognitive abilities sufficient to follow verbal commands for this, and demonstrates slow, careful, thorough mastication. Requires 2-3 additional swallows and liquid wash to reduce pharyngeal residue of solid to a functional degree, and is able to maintain airway clearance with intermittent  throat clearing as instructed. Given the level of effort required to manipulate, transfer, and clear boluses, patient is unlikely to be able  to maintain adequate nutrition and hydration by oral means, especially considering potential for declining function with fatigue. However, he is encouraged to continue consuming preferred foods and liquids for comfort/pleasure in bite size amounts (1.5 cm by 1.5 cm or smaller, to reduce risk for choking and to facilitate efficient mastication/minimize residue), and discuss nutritional needs for primary nutrition via PEG with his dietitian, as well as medications via PEG with pharmacy. Continue OP SLP for monitoring swallow function clinically as well as communication needs."  THERAPY DIAG:  Dysarthria and anarthria  Dysphagia, oropharyngeal phase  Rationale for Evaluation and Treatment Habilitation  SUBJECTIVE: Pt reports difficulty timing tube feeds and medications; encouraged him to discuss with MD and dietitian at visit to [facility] next week. Pt accompanied by:  Wife [name] PAIN:  Are you having pain? No   OBJECTIVE:   TODAY'S TREATMENT:  Pt continues to report consuming regular texture solids (steak, nuts) per personal choice; have educated on aspiration risks, precautions and altering bolus size/texture for safety/comfort with pleasure feeds and recommendation for primary feeding via PEG. Pt using boogie board effectively to participate in conversation during session with this Probation officer and his wife. Reports using it at home as well, and over the holidays to communicate with family. Pt and spouse in agreement to hold ST until 05/23/22, as communication remains stable at present and education re: aspiration risks and precautions has been provided, with pt electing to continue regular solids/thin liquids and supplement via PEG. Goals to be updated next session per reassessment.    PATIENT EDUCATION: Education details: fatigue and disease progression impact safety and ability to swallow, aspiration risks Person educated: Patient and Spouse Education method: Explanation, Demonstration, Verbal  cues, and Handouts Education comprehension: verbalized understanding, verbal cues required, and needs further education   SLP Short Term Goals - 03/13/21 1642       SLP SHORT TERM GOAL #1   Title Patient will demonstrate dysarthria HEP with modified independence.    Time 10    Period --   visits   Status Achieved      SLP SHORT TERM GOAL #2   Title Patient will use compensations for dysarthria for paragraph reading level 90% accuracy.    Time 10    Period Weeks    Status Achieved      SLP SHORT TERM GOAL #3   Title Patient will complete clinical swallowing assessment with goals added PRN    Time 2    Period Weeks    Status Achieved               SLP Long Term Goals - 03/13/21 1643       SLP LONG TERM GOAL #1   Title Pt will attempt repair of communication breakdowns using multimodal communication/AAC in 75% of opportunities.   Time 12    Period Weeks    Status Met     SLP LONG TERM GOAL #2   Title Patient will verbalize signs/symptoms of aspiration and aspiration pneumonia.    Time 12    Period Weeks    Status Met     SLP LONG TERM GOAL #3   Title Patient will demonstrate knowledge of external aids/apps for augmentative/alternative communication    Time 12    Period Weeks    Status Met   SLP LONG TERM GOAL #4  Title  Patient will utilize recommended precautions while consuming pleasure feeds with min cues.  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 [name] is now using [employer] consistently for communication in sessions, and reports using this with family. Repeat MBS completed on 11/14/21; during evaluation pt able to maintain airway protection with use of compensations including multiple swallows and  intermittent throat clear/cough with all consistencies, however demonstrates fatigue and requires significant effort to consume small amounts. Since MBS, shows clinically signs of progressing oropharyngeal dysphagia. Reports lingual numbness today. Pt continues to elect to consume full meals but states it is burdensome and requires extended time to eat. Continue to educate pt on aspiration risk and precautions, and support dietitian's recommendations to increase tube feedings. Continue to educate that pt is at high risk for choking/aspiration. Communication remains stable, and as no new needs arise, pt and spouse in agreement to return for follow-up in March 7 to assess for any new needs. Educated pt may return sooner should he have need or interest in utilizing another communication modality.    Speech Therapy Frequency --  Will return Mar 7 for follow-up    Duration 12 weeks    Treatment/Interventions Aspiration precaution training;Cueing hierarchy;SLP instruction and feedback;Pharyngeal strengthening exercises;Compensatory techniques;Functional tasks;Compensatory strategies;Diet toleration management by SLP;Trials of upgraded texture/liquids;Multimodal communcation approach;Internal/external aids;Patient/family education    Potential to Achieve Goals Fair    Potential Considerations Medical prognosis   progressive neurologic etiology   Consulted and Agree with Plan of Care Patient;Family member/caregiver    Family Member Consulted wife             Deneise Lever, [place], [place] Speech-Language Pathologist ([place], [place] 03/14/2022, 4:23 PM

## 2022-03-28 ENCOUNTER — Ambulatory Visit: Payer: Medicare Other | Admitting: Speech Pathology

## 2022-04-11 ENCOUNTER — Ambulatory Visit: Payer: Medicare Other | Admitting: Speech Pathology

## 2022-04-25 ENCOUNTER — Ambulatory Visit: Payer: Medicare Other | Admitting: Speech Pathology

## 2022-05-09 ENCOUNTER — Ambulatory Visit: Payer: Medicare Other | Admitting: Speech Pathology

## 2022-05-11 ENCOUNTER — Emergency Department: Payer: Medicare Other

## 2022-05-11 ENCOUNTER — Other Ambulatory Visit: Payer: Self-pay

## 2022-05-11 ENCOUNTER — Encounter: Payer: Self-pay | Admitting: Internal Medicine

## 2022-05-11 ENCOUNTER — Inpatient Hospital Stay
Admission: EM | Admit: 2022-05-11 | Discharge: 2022-05-16 | DRG: 388 | Disposition: A | Discharge status: Home / Self Care | Payer: Medicare Other | Attending: Student | Admitting: Student

## 2022-05-11 DIAGNOSIS — Z888 Allergy status to other drugs, medicaments and biological substances status: Secondary | ICD-10-CM

## 2022-05-11 DIAGNOSIS — K5900 Constipation, unspecified: Secondary | ICD-10-CM | POA: Insufficient documentation

## 2022-05-11 DIAGNOSIS — Z681 Body mass index (BMI) 19 or less, adult: Secondary | ICD-10-CM | POA: Diagnosis not present

## 2022-05-11 DIAGNOSIS — Z7982 Long term (current) use of aspirin: Secondary | ICD-10-CM | POA: Diagnosis not present

## 2022-05-11 DIAGNOSIS — E43 Unspecified severe protein-calorie malnutrition: Secondary | ICD-10-CM | POA: Insufficient documentation

## 2022-05-11 DIAGNOSIS — Z931 Gastrostomy status: Secondary | ICD-10-CM

## 2022-05-11 DIAGNOSIS — I2510 Atherosclerotic heart disease of native coronary artery without angina pectoris: Secondary | ICD-10-CM | POA: Diagnosis present

## 2022-05-11 DIAGNOSIS — E7800 Pure hypercholesterolemia, unspecified: Secondary | ICD-10-CM | POA: Diagnosis present

## 2022-05-11 DIAGNOSIS — R4701 Aphasia: Secondary | ICD-10-CM | POA: Diagnosis present

## 2022-05-11 DIAGNOSIS — R1310 Dysphagia, unspecified: Secondary | ICD-10-CM | POA: Diagnosis present

## 2022-05-11 DIAGNOSIS — G1221 Amyotrophic lateral sclerosis: Secondary | ICD-10-CM | POA: Diagnosis present

## 2022-05-11 DIAGNOSIS — Z79899 Other long term (current) drug therapy: Secondary | ICD-10-CM

## 2022-05-11 DIAGNOSIS — E876 Hypokalemia: Secondary | ICD-10-CM | POA: Diagnosis not present

## 2022-05-11 DIAGNOSIS — Z515 Encounter for palliative care: Secondary | ICD-10-CM | POA: Diagnosis not present

## 2022-05-11 DIAGNOSIS — N400 Enlarged prostate without lower urinary tract symptoms: Secondary | ICD-10-CM | POA: Insufficient documentation

## 2022-05-11 DIAGNOSIS — Z7189 Other specified counseling: Secondary | ICD-10-CM | POA: Diagnosis not present

## 2022-05-11 DIAGNOSIS — Z87891 Personal history of nicotine dependence: Secondary | ICD-10-CM | POA: Diagnosis not present

## 2022-05-11 DIAGNOSIS — K56609 Unspecified intestinal obstruction, unspecified as to partial versus complete obstruction: Principal | ICD-10-CM | POA: Diagnosis present

## 2022-05-11 DIAGNOSIS — K5651 Intestinal adhesions [bands], with partial obstruction: Principal | ICD-10-CM | POA: Diagnosis present

## 2022-05-11 LAB — COMPREHENSIVE METABOLIC PANEL
ALT: 33 U/L (ref 0–44)
AST: 40 U/L (ref 15–41)
Albumin: 4 g/dL (ref 3.5–5.0)
Alkaline Phosphatase: 56 U/L (ref 38–126)
Anion gap: 7 (ref 5–15)
BUN: 30 mg/dL — ABNORMAL HIGH (ref 8–23)
CO2: 27 mmol/L (ref 22–32)
Calcium: 9.1 mg/dL (ref 8.9–10.3)
Chloride: 105 mmol/L (ref 98–111)
Creatinine, Ser: 0.75 mg/dL (ref 0.61–1.24)
GFR, Estimated: >60 mL/min (ref >60)
Glucose, Bld: 195 mg/dL — ABNORMAL HIGH (ref 70–99)
Potassium: 3.6 mmol/L (ref 3.5–5.1)
Sodium: 139 mmol/L (ref 135–145)
Total Bilirubin: 0.9 mg/dL (ref 0.3–1.2)
Total Protein: 6.9 g/dL (ref 6.5–8.1)

## 2022-05-11 LAB — CBC
HCT: 39.6 % (ref 39.0–52.0)
Hemoglobin: 13.1 g/dL (ref 13.0–17.0)
MCH: 30.5 pg (ref 26.0–34.0)
MCHC: 33.1 g/dL (ref 30.0–36.0)
MCV: 92.3 fL (ref 80.0–100.0)
Platelets: 143 10*3/uL — ABNORMAL LOW (ref 150–400)
RBC: 4.29 MIL/uL (ref 4.22–5.81)
RDW: 13.4 % (ref 11.5–15.5)
WBC: 8.8 10*3/uL (ref 4.0–10.5)
nRBC: 0 % (ref 0.0–0.2)

## 2022-05-11 LAB — LIPASE, BLOOD: Lipase: 30 U/L (ref 11–51)

## 2022-05-11 MED ORDER — IOHEXOL 300 MG/ML  SOLN
100.0000 mL | Freq: Once | INTRAMUSCULAR | Status: AC | PRN
  Administered 2022-05-11: 100 mL via INTRAVENOUS

## 2022-05-11 MED ORDER — MORPHINE SULFATE (PF) 2 MG/ML IV SOLN
2.0000 mg | Freq: Once | INTRAVENOUS | Status: AC
  Administered 2022-05-11: 2 mg via INTRAVENOUS
  Filled 2022-05-11: qty 1

## 2022-05-11 NOTE — ED Notes (Signed)
Pt placed in room 1, this RN was instructed to place G-tube on suction. Room does not have intermittent suction. Will confer with admitting Dr about order for "G-tube to gravity"

## 2022-05-11 NOTE — ED Notes (Signed)
Patient transported to CT 

## 2022-05-11 NOTE — ED Provider Notes (Signed)
[facility] Provider Note    Event Date/Time   First MD Initiated Contact with Patient 05/11/22 1605     (approximate)   History   Abdominal Pain   HPI  Donald Gould is a 72 y.o. male   Past medical history of ALS, G-tube, who presents with abdominal pain.  He is supposed to be on nutritional supplements through his G-tube but he has reacted poorly to them in the past causing stomach discomfort as well as profuse diarrhea each time he tries, has tried multiple supplements with the same reactions.  Since then he has been eating by mouth which was discouraged by his doctor, and he has continued to lose weight.  They retried his nutritional supplement today and after administering via G-tube he got severe abdominal pain.  Otherwise, in the previous days he has had no acute medical complaints.  Diffuse severe abdominal pain.  No nausea vomiting or diarrhea.  No urinary symptoms.  No fevers or chills.  Independent Historian contributed to assessment above: His wife who is at bedside  External Medical Documents Reviewed: Speech therapy note 03/14/2022 which noted severe dysphagia and educated patient on aspiration risks and precautions with recommendations to increase supplemental tube feedings      Physical Exam   Triage Vital Signs: ED Triage Vitals  Enc Vitals Group     BP 05/11/22 1410 (!) 141/89     Pulse Rate 05/11/22 1410 75     Resp 05/11/22 1410 20     Temp 05/11/22 1410 99 F (37.2 C)     Temp Source 05/11/22 1410 Axillary     SpO2 05/11/22 1410 99 %     Weight 05/11/22 1412 168 lb (76.2 kg)     Height --      Head Circumference --      Peak Flow --      Pain Score 05/11/22 1411 (S) 10     Pain Loc --      Pain Edu? --      Excl. in [place]? --     Most recent vital signs: Vitals:   05/11/22 1410  BP: (!) 141/89  Pulse: 75  Resp: 20  Temp: 99 F (37.2 C)  SpO2: 99%    General: Awake, no distress.  CV:  Good peripheral  perfusion.  Resp:  Normal effort.  Abd:  No distention.  Other:  Awake alert communicative by writing, G-tube in place, abdomen is soft but has diffuse tenderness to palpation.  Vital signs reviewed within normal limits no fever.  Lungs are clear, no skin rash.   ED Results / Procedures / Treatments   Labs (all labs ordered are listed, but only abnormal results are displayed) Labs Reviewed  COMPREHENSIVE METABOLIC PANEL - Abnormal; Notable for the following components:      Result Value   Glucose, Bld 195 (*)    BUN 30 (*)    All other components within normal limits  CBC - Abnormal; Notable for the following components:   Platelets 143 (*)    All other components within normal limits  LIPASE, BLOOD     I ordered and reviewed the above labs they are notable for normal white blood cell count, H&H is 13/39, creatinine 0.7    RADIOLOGY I independently reviewed and interpreted CT of the abdomen pelvis and see dilated loops of bowel concerning for bowel obstruction   PROCEDURES:  Critical Care performed: No  Procedures   MEDICATIONS ORDERED IN  ED: Medications  morphine (PF) 2 MG/ML injection 2 mg (2 mg Intravenous Given 05/11/22 1642)  iohexol (OMNIPAQUE) 300 MG/ML solution 100 mL (100 mLs Intravenous Contrast Given 05/11/22 1701)    External physician / consultants:  I spoke with Dr. Dahlia Byes of general surgery regarding care plan for this patient.   IMPRESSION / MDM / ASSESSMENT AND PLAN / ED COURSE  I reviewed the triage vital signs and the nursing notes.                                Patient's presentation is most consistent with acute presentation with potential threat to life or bodily function.  Differential diagnosis includes, but is not limited to, obstruction, intra-abdominal infection, perforation, peritonitis, allergic reaction to the food supplement   The patient is on the cardiac monitor to evaluate for evidence of arrhythmia and/or significant heart  rate changes.  MDM: Patient with ALS who lives has had a bad reaction to the food supplements given history of the same and severe pain after his food supplements today.  No signs of allergic reaction like skin rash, hypotension, wheezing, nausea or vomiting.  He does have severe abdominal pain and it may or may not be related to the supplements and I have to consider other emergent abdominal pathologies like leakage from the [place] tube site, peritonitis, intra-abdominal infection, obstruction.  Will obtain CT scan with abdomen pelvis contrast and pain control with IV narcotic medications.   Well-controlled no vomiting in the emergency department comfortable.  He does have evidence of small bowel obstruction likely due to adhesions on CT scan.  I let the patient and the wife now.  I let Dr. Adora Fridge know of general surgery, he suggests putting the G-tube to gravity.  I will admit to hospitalist service.        FINAL CLINICAL IMPRESSION(S) / ED DIAGNOSES   Final diagnoses:  Small bowel obstruction ([place])     Rx / DC Orders   ED Discharge Orders     None        Note:  This document was prepared using Dragon voice recognition software and may include unintentional dictation errors.    Lucillie Garfinkel, MD 05/11/22 [phone]

## 2022-05-11 NOTE — ED Triage Notes (Signed)
Pt has ALS and has been doing feeding supplements through his feeding tube, that caused him diarrhea and stopped it. Today he started it again and is now having abdominal pain. Pt has been eating food orally and is not supposed to be.

## 2022-05-11 NOTE — H&P (Signed)
History and Physical    Patient: Donald Gould [id number] DOB: [birth year] DOA: 05/11/2022 DOS: the patient was seen and examined on 05/11/2022 PCP: Derinda Late, MD  Patient coming from: Home  Chief Complaint:  Chief Complaint  Patient presents with   Abdominal Pain   HPI: Donald Gould is a 72 y.o. male with medical history significant of ALS, aphasia, dysphagia with G-tube in place, but he did not tolerated and is therefore getting some p.o. intake and previous severe protein calorie malnutrition from poor oral intake who presented to the ER with severe abdominal pain nausea and vomiting.  Patient also has generalized weakness.  He was seen and evaluated.  Patient found to have small bowel obstruction.  Surgery consulted however patient is not having ongoing vomiting so NG tube not yet inserted.  Is being admitted to the medical service with surgical consult.  Review of Systems: As mentioned in the history of present illness. All other systems reviewed and are negative. Past Medical History:  Diagnosis Date   ALS (amyotrophic lateral sclerosis) ([place])    Environmental allergies    Past Surgical History:  Procedure Laterality Date   CATARACT EXTRACTION W/PHACO Right 07/02/2021   Procedure: CATARACT EXTRACTION PHACO AND INTRAOCULAR LENS PLACEMENT ([place]) RIGHT;  Surgeon: Leandrew Koyanagi, MD;  Location: [facility];  Service: Ophthalmology;  Laterality: Right;  11.84 1:15.6   RETINAL DETACHMENT SURGERY Left 01/10/2021   [facility]   Social History:  reports that he quit smoking about 34 years ago. His smoking use included cigarettes. He has never used smokeless tobacco. He reports current alcohol use of about 11.0 standard drinks of alcohol per week. No history on file for drug use.  Allergies  Allergen Reactions   Statins     Joint pain    History reviewed. No pertinent family history.  Prior to Admission medications   Medication Sig Start Date End Date Taking?  Authorizing Provider  dicyclomine (BENTYL) 20 MG tablet Take by mouth. 04/03/22  Yes [provider]  scopolamine (TRANSDERM-SCOP) 1 MG/3DAYS Place 1 patch onto the skin every 3 (three) days. 03/26/22  Yes [provider]  ASPIRIN 81 PO Take by mouth daily.    [provider]  atropine 1 % ophthalmic solution Place 2 drops under the tongue 3 (three) times daily.    [provider]  cetirizine (ZYRTEC) 10 MG tablet Take 10 mg by mouth daily.    [provider]  Cholecalciferol (VITAMIN D3) 50 MCG (2000 UT) TABS Take by mouth daily.    [provider]  Cyanocobalamin (VITAMIN B-12) 5000 MCG TBDP Take by mouth daily.    [provider]  ezetimibe (ZETIA) 10 MG tablet Take 10 mg by mouth daily.    [provider]  finasteride (PROSCAR) 5 MG tablet Take 5 mg by mouth daily.    [provider]  glycopyrrolate (ROBINUL) 2 MG tablet Take 2 mg by mouth 3 (three) times daily.    [provider]  ketoconazole (NIZORAL) 2 % shampoo Apply to upper body 3 days a week as a body wash, after 2 months decrease to once weekly 07/25/21   Ralene Bathe, MD  riluzole (RILUTEK) 50 MG tablet Take 50 mg by mouth every 12 (twelve) hours.    [provider]  tamsulosin (FLOMAX) 0.4 MG CAPS capsule Take 0.4 mg by mouth daily.    [provider]    Physical Exam: Vitals:   05/11/22 1410 05/11/22 1412 05/11/22 1756 05/11/22  2026  BP: (!) 141/89  131/75 138/74  Pulse: 75  65 70  Resp: '20  18 18  '$ Temp: 99 F (37.2 C)   97.8 F (36.6 C)  TempSrc: Axillary   Oral  SpO2: 99%  100% 100%  Weight:  76.2 kg     Constitutional: Aphasic, communicating NAD, calm, comfortable Eyes: PERRL, lids and conjunctivae normal ENMT: Mucous membranes are moist. Posterior pharynx clear of any exudate or lesions.Normal dentition.  Neck: normal, supple, no masses, no thyromegaly Respiratory: clear to auscultation bilaterally, no  wheezing, no crackles. Normal respiratory effort. No accessory muscle use.  Cardiovascular: Regular rate and rhythm, no murmurs / rubs / gallops. No extremity edema. 2+ pedal pulses. No carotid bruits.  Abdomen: no tenderness, no masses palpated. No hepatosplenomegaly. Bowel sounds positive.  Musculoskeletal: Good range of motion, no joint swelling or tenderness, Skin: no rashes, lesions, ulcers. No induration Neurologic: CN 2-12 grossly intact. Sensation intact, DTR normal. Strength 5/5 in all 4.  Psychiatric: Normal judgment and insight. Alert and oriented x 3. Normal mood  Data Reviewed:  Glucose 195, BUN 30.  CT abdomen pelvis shows small bowel obstruction with transition point just deep to the anterior abdominal wall at the umbilicus suspicious for adhesions  Assessment and Plan:  #1 small bowel obstruction: No NG tube at the moment.  Initiate IV fluids, other supportive care.  Surgery to follow.  Continue monitor  #2 ALS: Advanced.  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  Continue to monitor  #4 severe protein calorie malnutrition: Stable.  Continue to monitor   Advance Care Planning:   Code Status: Full Code   Consults: None  Family Communication: No family at bedside  Severity of Illness: The appropriate patient status for this patient is INPATIENT. Inpatient status is judged to be reasonable and necessary in order to provide the required intensity of service to ensure the patient's safety. The patient's presenting symptoms, physical exam findings, and initial radiographic and laboratory data in the context of their chronic comorbidities is felt to place them at high risk for further clinical deterioration. Furthermore, it is not anticipated that the patient will be medically stable for discharge from the hospital within 2 midnights of admission.   * I certify that at the point of admission it is my clinical judgment that the patient will require inpatient hospital care  spanning beyond 2 midnights from the point of admission due to high intensity of service, high risk for further deterioration and high frequency of surveillance required.*  AuthorBarbette Merino, MD 05/11/2022 10:20 PM  For on call review [email or web address].

## 2022-05-12 ENCOUNTER — Inpatient Hospital Stay: Payer: Medicare Other

## 2022-05-12 DIAGNOSIS — I2510 Atherosclerotic heart disease of native coronary artery without angina pectoris: Secondary | ICD-10-CM

## 2022-05-12 DIAGNOSIS — G1221 Amyotrophic lateral sclerosis: Secondary | ICD-10-CM | POA: Diagnosis not present

## 2022-05-12 DIAGNOSIS — N400 Enlarged prostate without lower urinary tract symptoms: Secondary | ICD-10-CM | POA: Insufficient documentation

## 2022-05-12 DIAGNOSIS — K56609 Unspecified intestinal obstruction, unspecified as to partial versus complete obstruction: Secondary | ICD-10-CM | POA: Diagnosis not present

## 2022-05-12 DIAGNOSIS — K5900 Constipation, unspecified: Secondary | ICD-10-CM | POA: Diagnosis not present

## 2022-05-12 LAB — CBC
HCT: 38.5 % — ABNORMAL LOW (ref 39.0–52.0)
Hemoglobin: 13 g/dL (ref 13.0–17.0)
MCH: 30.4 pg (ref 26.0–34.0)
MCHC: 33.8 g/dL (ref 30.0–36.0)
MCV: 90.2 fL (ref 80.0–100.0)
Platelets: 158 10*3/uL (ref 150–400)
RBC: 4.27 MIL/uL (ref 4.22–5.81)
RDW: 13.5 % (ref 11.5–15.5)
WBC: 8 10*3/uL (ref 4.0–10.5)
nRBC: 0 % (ref 0.0–0.2)

## 2022-05-12 LAB — COMPREHENSIVE METABOLIC PANEL
ALT: 29 U/L (ref 0–44)
AST: 30 U/L (ref 15–41)
Albumin: 3.8 g/dL (ref 3.5–5.0)
Alkaline Phosphatase: 53 U/L (ref 38–126)
Anion gap: 7 (ref 5–15)
BUN: 25 mg/dL — ABNORMAL HIGH (ref 8–23)
CO2: 25 mmol/L (ref 22–32)
Calcium: 9.2 mg/dL (ref 8.9–10.3)
Chloride: 107 mmol/L (ref 98–111)
Creatinine, Ser: 0.68 mg/dL (ref 0.61–1.24)
GFR, Estimated: >60 mL/min (ref >60)
Glucose, Bld: 142 mg/dL — ABNORMAL HIGH (ref 70–99)
Potassium: 3.5 mmol/L (ref 3.5–5.1)
Sodium: 139 mmol/L (ref 135–145)
Total Bilirubin: 1.1 mg/dL (ref 0.3–1.2)
Total Protein: 6.4 g/dL — ABNORMAL LOW (ref 6.5–8.1)

## 2022-05-12 MED ORDER — DEXTROSE IN LACTATED RINGERS 5 % IV SOLN: INTRAVENOUS | Status: DC

## 2022-05-12 MED ORDER — MORPHINE SULFATE (PF) 2 MG/ML IV SOLN
2.0000 mg | INTRAVENOUS | Status: DC | PRN
  Administered 2022-05-12: 2 mg via INTRAVENOUS
  Filled 2022-05-12: qty 1

## 2022-05-12 MED ORDER — ENOXAPARIN SODIUM 40 MG/0.4ML IJ SOSY
40.0000 mg | PREFILLED_SYRINGE | INTRAMUSCULAR | Status: DC
  Administered 2022-05-12 – 2022-05-15 (×4): 40 mg via SUBCUTANEOUS
  Filled 2022-05-12 (×4): qty 0.4

## 2022-05-12 MED ORDER — ONDANSETRON HCL 4 MG/2ML IJ SOLN: 4.0000 mg | Freq: Four times a day (QID) | INTRAMUSCULAR | Status: DC | PRN

## 2022-05-12 MED ORDER — ONDANSETRON HCL 4 MG PO TABS: 4.0000 mg | ORAL_TABLET | Freq: Four times a day (QID) | ORAL | Status: DC | PRN

## 2022-05-12 MED ORDER — DIATRIZOATE MEGLUMINE & SODIUM 66-10 % PO SOLN
90.0000 mL | Freq: Once | ORAL | Status: AC
  Administered 2022-05-12: 90 mL via NASOGASTRIC

## 2022-05-12 NOTE — Progress Notes (Signed)
PROGRESS NOTE  Donald Gould [id number] DOB: Aug 26, 1950   PCP: Derinda Late, MD  Patient is from: Home  DOA: 05/11/2022 LOS: 1  Chief complaints Chief Complaint  Patient presents with   Abdominal Pain     Brief Narrative / Interim history: 72 year old M with PMH of ALS, aphasia/dysarthria and dysphagia on G-tube presenting with severe abdominal pain, nausea and vomiting for about a week and found to have small bowel obstruction as noted on CT abdomen and pelvis which also showed constipation and BPH.  Patient has not had further vomiting and NG tube was not inserted.  General surgery consulted.    Subjective: Seen and examined earlier this morning.  No major events overnight of this morning.  He communicated through writing pad.  He reports 10/10 stomach pain.  He thinks he had bowel movement yesterday.  Not sure about last flatus.  Denies nausea or vomiting  Objective: Vitals:   05/11/22 1756 05/11/22 2026 05/12/22 0529 05/12/22 0804  BP: 131/75 138/74 (!) 144/81 139/79  Pulse: 65 70 71 67  Resp: '18 18 18 20  '$ Temp:  97.8 F (36.6 C) 98 F (36.7 C) (!) 97.5 F (36.4 C)  TempSrc:  Oral Oral   SpO2: 100% 100% 97% 97%  Weight:        Examination:  GENERAL: No apparent distress.  Nontoxic. HEENT: MMM.  Vision and hearing grossly intact.  NECK: Supple.  No apparent JVD.  RESP:  No IWOB.  Fair aeration bilaterally. CVS:  RRR. Heart sounds normal.  ABD/GI/GU: BS+.  Abdomen distended.  Nontender.  G-tube in place. MSK/EXT:  Moves extremities. No apparent deformity. No edema.  SKIN: no apparent skin lesion or wound NEURO: Awake, alert and oriented appropriately.  No apparent focal neuro deficit. PSYCH: Calm. Normal affect.   Procedures:  None  Microbiology summarized: None  Assessment and plan: Principal Problem:   SBO (small bowel obstruction) (HCC) Active Problems:   ALS (amyotrophic lateral sclerosis) (H[facility])   Coronary artery disease involving native  heart without angina pectoris   Hypercholesterolemia   BPH (benign prostatic hyperplasia)   Constipation  Small bowel obstruction: Presents with nausea, vomiting and abdominal pain. CT abdomen pelvis shows small bowel obstruction with transition point just deep to the anterior abdominal wall at the umbilicus suspicious for adhesions.  -N.p.o., IV fluid, analgesics and antiemetics -General surgery following   Amyotrophic lateral sclerosis: -Supportive care.   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  Constipation: CT showed large stool burden -May try Dulcolax suppository  BPH/prostamegaly -Strict intake and output and bladder scan intermittently -Resume home Flomax and Proscar once able to take p.o.   History of CAD: Stable.  Body mass index is 21 kg/m.          DVT prophylaxis:  enoxaparin (LOVENOX) injection 40 mg Start: 05/12/22 0800  Code Status: Full code Family Communication: None at bedside Level of care: Med-Surg Status is: Inpatient Remains inpatient appropriate because: Small bowel obstruction   Final disposition: Home once medically cleared Consultants:  General surgery  35 minutes with more than 50% spent in reviewing records, counseling patient/family and coordinating care.   Sch Meds:  Scheduled Meds:  enoxaparin (LOVENOX) injection  40 mg Subcutaneous Q24H   Continuous Infusions:  dextrose 5% lactated ringers 100 mL/hr at 05/12/22 0313   PRN Meds:.morphine injection, ondansetron **OR** ondansetron (ZOFRAN) IV  Antimicrobials: Anti-infectives (From admission, onward)    None  I have personally reviewed the following labs and images: CBC: Recent Labs  Lab 05/11/22 1414 05/12/22 0417  WBC 8.8 8.0  HGB 13.1 13.0  HCT 39.6 38.5*  MCV 92.3 90.2  PLT 143* 158   BMP &GFR Recent Labs  Lab 05/11/22 1414 05/12/22 0417  NA 139 139  K 3.6 3.5  CL 105 107  CO2 27 25   GLUCOSE 195* 142*  BUN 30* 25*  CREATININE 0.75 0.68  CALCIUM 9.1 9.2   CrCl cannot be calculated (Unknown ideal weight.). Liver & Pancreas: Recent Labs  Lab 05/11/22 1414 05/12/22 0417  AST 40 30  ALT 33 29  ALKPHOS 56 53  BILITOT 0.9 1.1  PROT 6.9 6.4*  ALBUMIN 4.0 3.8   Recent Labs  Lab 05/11/22 1414  LIPASE 30   No results for input(s): "AMMONIA" in the last 168 hours. Diabetic: No results for input(s): "HGBA1C" in the last 72 hours. No results for input(s): "GLUCAP" in the last 168 hours. Cardiac Enzymes: No results for input(s): "CKTOTAL", "CKMB", "CKMBINDEX", "TROPONINI" in the last 168 hours. No results for input(s): "PROBNP" in the last 8760 hours. Coagulation Profile: No results for input(s): "INR", "PROTIME" in the last 168 hours. Thyroid Function Tests: No results for input(s): "TSH", "T4TOTAL", "FREET4", "T3FREE", "THYROIDAB" in the last 72 hours. Lipid Profile: No results for input(s): "CHOL", "HDL", "LDLCALC", "TRIG", "CHOLHDL", "LDLDIRECT" in the last 72 hours. Anemia Panel: No results for input(s): "VITAMINB12", "FOLATE", "FERRITIN", "TIBC", "IRON", "RETICCTPCT" in the last 72 hours. Urine analysis: No results found for: "COLORURINE", "APPEARANCEUR", "LABSPEC", "PHURINE", "GLUCOSEU", "HGBUR", "BILIRUBINUR", "KETONESUR", "PROTEINUR", "UROBILINOGEN", "NITRITE", "LEUKOCYTESUR" Sepsis Labs: Invalid input(s): "PROCALCITONIN", "LACTICIDVEN"  Microbiology: No results found for this or any previous visit (from the past 240 hour(s)).  Radiology Studies: DG Abd 1 View  Result Date: 05/12/2022 CLINICAL DATA:  Nasogastric tube present. EXAM: ABDOMEN - 1 VIEW COMPARISON:  Radiograph earlier today. FINDINGS: The side port of the enteric tube remains in the distal esophagus, advancement of an additional 5 cm is recommended for optimal placement. Gaseous bowel distention in the upper abdomen persists. IMPRESSION: The side port of the enteric tube remains in the  distal esophagus, advancement of an additional 5 cm is recommended for optimal placement. Electronically Signed   By: Keith Rake M.D.   On: 05/12/2022 13:02   DG Abd Portable 1V-Small Bowel Protocol-Position Verification  Result Date: 05/12/2022 CLINICAL DATA:  Nasogastric tube placement. EXAM: PORTABLE ABDOMEN - 1 VIEW COMPARISON:  Radiographs earlier today. FINDINGS: Tip of the enteric tube is below the diaphragm in the stomach, however the side-port is in the distal esophagus. Recommend advancement of at least 6 cm for optimal placement. Small bowel distention in the upper abdomen is similar. IMPRESSION: Tip of the enteric tube below the diaphragm in the stomach, however the side-port is in the distal esophagus. Recommend advancement of at least 6 cm for optimal placement. Electronically Signed   By: Keith Rake M.D.   On: 05/12/2022 11:39   DG ABD ACUTE 2+V W 1V CHEST  Result Date: 05/12/2022 CLINICAL DATA:  72 year old male with evidence of small-bowel obstruction on CT yesterday. EXAM: DG ABDOMEN ACUTE WITH 1 VIEW CHEST COMPARISON:  CT Abdomen and Pelvis yesterday. FINDINGS: PA chest at 0713 hours. Somewhat low lung volumes. Normal cardiac size and mediastinal contours. Visualized tracheal air column is within normal limits. Both lungs appear clear. No pneumothorax or visible pneumoperitoneum. Upright and supine views of the abdomen and pelvis at 0711 hours. Ongoing small  bowel obstruction gas pattern. Excreted IV contrast in the urinary bladder. Degree of large bowel gas also appears stable from the CT yesterday. No pneumoperitoneum identified. Stable gastrostomy tube. No acute osseous abnormality identified. IMPRESSION: 1. Unchanged small bowel obstruction from CT yesterday. No pneumoperitoneum identified. 2. No acute cardiopulmonary abnormality. 3. Stable gastrostomy tube. Excreted IV contrast in the urinary bladder. Electronically Signed   By: Genevie Ann M.D.   On: 05/12/2022 07:24   CT  Abdomen Pelvis W Contrast  Result Date: 05/11/2022 CLINICAL DATA:  Abdominal pain and diarrhea. EXAM: CT ABDOMEN AND PELVIS WITH CONTRAST TECHNIQUE: Multidetector CT imaging of the abdomen and pelvis was performed using the standard protocol following bolus administration of intravenous contrast. RADIATION DOSE REDUCTION: This exam was performed according to the departmental dose-optimization program which includes automated exposure control, adjustment of the mA and/or kV according to patient size and/or use of iterative reconstruction technique. CONTRAST:  134m OMNIPAQUE IOHEXOL 300 MG/ML  SOLN COMPARISON:  None Available. FINDINGS: Lower Chest: No acute findings. Hepatobiliary: No hepatic masses identified. Small cyst seen in the inferior right hepatic lobe and adjacent to the gallbladder fossa. Gallbladder is unremarkable. No evidence of biliary ductal dilatation. Pancreas:  No mass or inflammatory changes. Spleen: Within normal limits in size. Tiny sub-cm cyst seen in the superior aspect of the spleen. Adrenals/Urinary Tract: No suspicious masses identified. No evidence of ureteral calculi or hydronephrosis. Stomach/Bowel: Percutaneous gastrostomy tube is seen in expected position. No evidence of free intraperitoneal air. Moderately dilated small bowel loops with air-fluid levels are seen throughout the abdomen and pelvis. A transition point is seen just deep to the anterior abdominal wall at the umbilicus, suspicious for adhesion. No evidence of focal inflammatory process or abscess. Large amount of stool is noted throughout the length of the colon. No evidence of obstruction, inflammatory process or abnormal fluid collections. Vascular/Lymphatic: No pathologically enlarged lymph nodes. No acute vascular findings. Aortic atherosclerotic calcification incidentally noted. Reproductive:  Markedly enlarged prostate gland noted. Other:  None. Musculoskeletal:  No suspicious bone lesions identified. IMPRESSION:  Small bowel obstruction, with transition point just deep to the anterior abdominal wall at the umbilicus, suspicious for adhesion. Large stool burden noted; recommend clinical correlation for possible constipation. Markedly enlarged prostate gland. Aortic Atherosclerosis (ICD10-I70.0). Electronically Signed   By: JMarlaine HindM.D.   On: 05/11/2022 17:28      Zanaria Morell T. [place] If 7PM-7AM, please contact night-coverage [email or web address] 05/12/2022, 1:23 PM

## 2022-05-12 NOTE — Progress Notes (Signed)
Per the Dr. Dahlia Byes place Gastrografin through the G tube rather than the NG tube.

## 2022-05-12 NOTE — Progress Notes (Signed)
NG tube advanced 6 cm

## 2022-05-12 NOTE — Consult Note (Signed)
Patient ID: Donald Gould, male   DOB: 24-Dec-1950, 72 y.o.   MRN: [id number]  HPI Donald Gould is a 72 y.o. male in consultation at the request of Dr. Jonelle Sidle for SBO. 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  He is fairly functional my wife tells me that he was driving up to a few days ago.  He has been diagnosed with ALS and since then has have progressive deterioration and specifically with weight loss, malnutrition inability swallow.  He did have a PEG tube at [facility] about 8 months ago by Dr. Waylan Rocher 08/2021.  He also had a prior ventral hernia surgery over a decade ago within the [place] [facility] at the [facility]. -Is not sure whether or not there was mesh in there or not. Came in yesterday with abdominal pain that has been intermittent.  The history is taken from the wife as the patient is able to understand what is being said but is unable to communicate due to his ametropic lateral sclerosis. They have been having a lot of issues with different enteric formula as causing distention and diarrhea.  History of developed significant abdominal pain. Unable to obtain more characteristics regarding the pain since the patient is unable to tell me a story. An extensive discussion with the wife and apparently patient is full code. Did have a CT scan that I personally reviewed showing evidence of small bowel obstruction with transition point anterior to the abdominal wall.  HPI  Past Medical History:  Diagnosis Date   ALS (amyotrophic lateral sclerosis) ([place])    Environmental allergies     Past Surgical History:  Procedure Laterality Date   CATARACT EXTRACTION W/PHACO Right 07/02/2021   Procedure: CATARACT EXTRACTION PHACO AND INTRAOCULAR LENS PLACEMENT ([place]) RIGHT;  Surgeon: Leandrew Koyanagi, MD;  Location: [place];  Service: Ophthalmology;  Laterality: Right;  11.84 1:15.6   RETINAL DETACHMENT SURGERY Left 01/10/2021    [facility]    History reviewed. No pertinent family history.  Social History Social History   Tobacco Use   Smoking status: Former    Types: Cigarettes    Quit date: 1990    Years since quitting: 34.1   Smokeless tobacco: Never  Vaping Use   Vaping Use: Never used  Substance Use Topics   Alcohol use: Yes    Alcohol/week: 11.0 standard drinks of alcohol    Types: 11 Standard drinks or equivalent per week    Comment: 1.5 shots/day    Allergies  Allergen Reactions   Statins     Joint pain    Current Facility-Administered Medications  Medication Dose Route Frequency Provider Last Rate Last Admin   dextrose 5 % in lactated ringers infusion   Intravenous Continuous Gala Romney L, MD 100 mL/hr at 05/12/22 1405 New Bag at 05/12/22 1405   enoxaparin (LOVENOX) injection 40 mg  40 mg Subcutaneous Q24H Gala Romney L, MD   40 mg at 05/12/22 [id number]   morphine (PF) 2 MG/ML injection 2 mg  2 mg Intravenous Q2H PRN Elwyn Reach, MD   2 mg at 05/12/22 0911   ondansetron (ZOFRAN) tablet 4 mg  4 mg Oral Q6H PRN Elwyn Reach, MD       Or   ondansetron (ZOFRAN) injection 4 mg  4 mg Intravenous Q6H PRN Elwyn Reach, MD         Review of Systems Full ROS unable to be obtained due to ALS Physical Exam  Blood pressure 139/79, pulse 67, temperature (!) 97.5 F (36.4 C), resp. rate 20, weight 76.2 kg, SpO2 97 %. CONSTITUTIONAL: Chronically ill and malnourished. EYES: Pupils are equal, round, and reactive to light, Sclera are non-icteric. EARS, NOSE, MOUTH AND THROAT: The oropharynx is clear. The oral mucosa is pink and moist. Hearing is intact to voice. LYMPH NODES:  Lymph nodes in the neck are normal. RESPIRATORY:  Lungs are clear. There is normal respiratory effort, with equal breath sounds bilaterally, and without pathologic use of accessory muscles. CARDIOVASCULAR: Heart is regular without murmurs, gallops, or rubs. GI: The abdomen is  soft,  There are decreased bowel sounds   Gastrostomy tube in place.  Seems to be mildly tender to palpation diffusely without peritonitis.  GU: Rectal deferred.   MUSCULOSKELETAL: Normal muscle strength and tone. No cyanosis or edema.   SKIN: Turgor is good and there are no pathologic skin lesions or ulcers. NEUROLOGIC: Motor and sensation is grossly normal. Cranial nerves are grossly intact.  He is able to blink and understands exactly what I say   Data Reviewed  I have personally reviewed the patient's imaging, laboratory findings and medical records.    Assessment/Plan 72 year old male with evidence for small bowel obstruction difficult to read secondary to A. lateral sclerosis.  He does not seem peritonitic and no need for emergent surgical intervention at this time.  Will continue NG decompression and we will obtain a Gastrografin challenge today to further assess his bowel obstruction.  Recommend continuation of IV fluids and serial abdominal exams.  Will continue to follow.  Please note that I spent 75 minutes in this encounter including personally reviewing imaging studies, coordinating his care, placing orders and performing a proper documentation.    Caroleen Hamman, MD FACS General Surgeon 05/12/2022, 3:18 PM

## 2022-05-13 ENCOUNTER — Inpatient Hospital Stay: Payer: Medicare Other

## 2022-05-13 DIAGNOSIS — K5900 Constipation, unspecified: Secondary | ICD-10-CM | POA: Diagnosis not present

## 2022-05-13 DIAGNOSIS — G1221 Amyotrophic lateral sclerosis: Secondary | ICD-10-CM | POA: Diagnosis not present

## 2022-05-13 DIAGNOSIS — I2510 Atherosclerotic heart disease of native coronary artery without angina pectoris: Secondary | ICD-10-CM | POA: Diagnosis not present

## 2022-05-13 DIAGNOSIS — E876 Hypokalemia: Secondary | ICD-10-CM

## 2022-05-13 DIAGNOSIS — K56609 Unspecified intestinal obstruction, unspecified as to partial versus complete obstruction: Secondary | ICD-10-CM | POA: Diagnosis not present

## 2022-05-13 LAB — MAGNESIUM: Magnesium: 2.2 mg/dL (ref 1.7–2.4)

## 2022-05-13 LAB — CBC
HCT: 38 % — ABNORMAL LOW (ref 39.0–52.0)
Hemoglobin: 12.8 g/dL — ABNORMAL LOW (ref 13.0–17.0)
MCH: 30.6 pg (ref 26.0–34.0)
MCHC: 33.7 g/dL (ref 30.0–36.0)
MCV: 90.9 fL (ref 80.0–100.0)
Platelets: 143 10*3/uL — ABNORMAL LOW (ref 150–400)
RBC: 4.18 MIL/uL — ABNORMAL LOW (ref 4.22–5.81)
RDW: 13.9 % (ref 11.5–15.5)
WBC: 7.6 10*3/uL (ref 4.0–10.5)
nRBC: 0 % (ref 0.0–0.2)

## 2022-05-13 LAB — RENAL FUNCTION PANEL
Albumin: 3.7 g/dL (ref 3.5–5.0)
Anion gap: 8 (ref 5–15)
BUN: 22 mg/dL (ref 8–23)
CO2: 28 mmol/L (ref 22–32)
Calcium: 9.2 mg/dL (ref 8.9–10.3)
Chloride: 107 mmol/L (ref 98–111)
Creatinine, Ser: 0.6 mg/dL — ABNORMAL LOW (ref 0.61–1.24)
GFR, Estimated: >60 mL/min (ref >60)
Glucose, Bld: 123 mg/dL — ABNORMAL HIGH (ref 70–99)
Phosphorus: 3.1 mg/dL (ref 2.5–4.6)
Potassium: 3.3 mmol/L — ABNORMAL LOW (ref 3.5–5.1)
Sodium: 143 mmol/L (ref 135–145)

## 2022-05-13 MED ORDER — POTASSIUM CHLORIDE 10 MEQ/100ML IV SOLN
10.0000 meq | INTRAVENOUS | Status: AC
  Administered 2022-05-13 (×4): 10 meq via INTRAVENOUS
  Filled 2022-05-13 (×3): qty 100

## 2022-05-13 MED ORDER — KCL-LACTATED RINGERS-D5W 20 MEQ/L IV SOLN
INTRAVENOUS | Status: DC
  Filled 2022-05-13 (×9): qty 1000

## 2022-05-13 MED ORDER — LORAZEPAM 2 MG/ML IJ SOLN
0.5000 mg | Freq: Two times a day (BID) | INTRAMUSCULAR | Status: DC | PRN
  Administered 2022-05-13: 0.5 mg via INTRAVENOUS
  Filled 2022-05-13: qty 1

## 2022-05-13 NOTE — Progress Notes (Signed)
[facility] SURGICAL PROGRESS NOTE (cpt [id number])  Hospital Day(s): 2.   Interval History: Patient seen and examined, no acute events or new complaints overnight. Patient difficult to communicate with given ALS but does answer yes/no questions appropriately. He reports abdominal discomfort improving. No fever, chills, nausea, emesis. He is without leukocytosis; WBC 7.6K. Hgb to 12.8. Renal function normal with sCr - 0.60; UO - 200 ccs + unmeasured. Hypokalemia to 3.3. NGT with 700 ccs recorded and another 900 ccs in canister this AM (1600 ccs); this is feculent appearing. KUB pending this AM. He does report flatus but no stool    Review of Systems:  Constitutional: denies fever, chills  HEENT: denies cough or congestion  Respiratory: denies any shortness of breath  Cardiovascular: denies chest pain or palpitations  Gastrointestinal: + abdominal pain (improving), denied N/V Genitourinary: denies burning with urination or urinary frequency  Vital signs in last 24 hours: [min-max] current  Temp:  [97.5 F (36.4 C)-97.8 F (36.6 C)] 97.6 F (36.4 C) (02/26 0442) Pulse Rate:  [67-73] 67 (02/26 0442) Resp:  [16-20] 18 (02/26 0442) BP: (133-149)/(77-80) 133/80 (02/26 0442) SpO2:  [96 %-97 %] 97 % (02/26 0442)       Weight: 76.2 kg     Intake/Output last 2 shifts:  02/25 0701 - 02/26 0700 In: 0  Out: 900 [Urine:200; Emesis/NG output:700]   Physical Exam:  Constitutional: alert, cooperative and no distress  HENT: normocephalic without obvious abnormality; NGT in place; output feculent Eyes: PERRL, EOM's grossly intact and symmetric  Respiratory: breathing non-labored at rest  Cardiovascular: regular rate and sinus rhythm  Gastrointestinal: Soft, no significant tenderness, non-distended, no rebound/guarding. Gastrostomy in LUQ; site CDI and dressed    Labs:     Latest Ref Rng & Units 05/13/2022    3:50 AM 05/12/2022    4:17 AM 05/11/2022    2:14 PM  CBC  WBC 4.0 - 10.5  K/uL 7.6  8.0  8.8   Hemoglobin 13.0 - 17.0 g/dL 12.8  13.0  13.1   Hematocrit 39.0 - 52.0 % 38.0  38.5  39.6   Platelets 150 - 400 K/uL 143  158  143       Latest Ref Rng & Units 05/13/2022    3:50 AM 05/12/2022    4:17 AM 05/11/2022    2:14 PM  CMP  Glucose 70 - 99 mg/dL 123  142  195   BUN 8 - 23 mg/dL '22  25  30   '$ Creatinine 0.61 - 1.24 mg/dL 0.60  0.68  0.75   Sodium 135 - 145 mmol/L 143  139  139   Potassium 3.5 - 5.1 mmol/L 3.3  3.5  3.6   Chloride 98 - 111 mmol/L 107  107  105   CO2 22 - 32 mmol/L '28  25  27   '$ Calcium 8.9 - 10.3 mg/dL 9.2  9.2  9.1   Total Protein 6.5 - 8.1 g/dL  6.4  6.9   Total Bilirubin 0.3 - 1.2 mg/dL  1.1  0.9   Alkaline Phos 38 - 126 U/L  53  56   AST 15 - 41 U/L  30  40   ALT 0 - 44 U/L  29  33     Imaging studies:   KUB (05/13/2022) personally reviewed still with dilated loops of small bowel particularly in left abdomen, there does seem to be air now progressed through right colon, ? Stool in rectum, and radiologist report pending.Marland KitchenMarland Kitchen  Assessment/Plan: (ICD-10's: K51.609) 72 y.o. male with SBO, complicated by pertinent comorbidities including ALS.   - Still appears to have persistent SBO. Although he reports flatus, KUB still have dilated loops of small bowel and NGT output high. As such, I do think we need to continue NGT for LIS for now and record output. Patient is very anxious about NGT. I will place G-tube to gravity to see if this aids in decompression/drainage.    - No indication for emergent surgical intervention currently  - Continue NPO for now + IVF support    - We do need to replete K+; monitor - Monitor abdominal examination; on-going bowel function  - Serial KUBs as needed  - Pain control prn; antiemetics prn   - Further management per primary service; we will of course follow along   All of the above findings and recommendations were discussed with the patient, and the medical team, and all of patient's questions were answered  to his expressed satisfaction.  -- Edison Simon, PA-C [facility] 05/13/2022, 7:20 AM M-F: 7am - 4pm

## 2022-05-13 NOTE — Progress Notes (Signed)
Initial Nutrition Assessment  DOCUMENTATION CODES:   Severe malnutrition in context of chronic illness  INTERVENTION:   Recommend TPN if prolonged NPO is expected.   RD will provide tube feeds once ok per MD  Pt at high refeed risk; recommend monitor potassium, magnesium and phosphorus labs daily until stable  Once tube feeds resumed, recommend:  Donald Gould Standard 1.4 '@70ml'$ /hr- Initiate at 100m/hr, once tolerating advance by 135mhr q 8 hours until goal rate is reached.   Free water flushes 3030m4 hours to maintain tube patency   Regimen provides 2352kcal/day, 103g/day protein and 1390m32my of free water  Daily weights   NUTRITION DIAGNOSIS:   Severe Malnutrition related to chronic illness (ALS, dysphagia) as evidenced by severe fat depletion, severe muscle depletion, 10 percent weight loss in 1 year.  GOAL:   Patient will meet greater than or equal to 90% of their needs  MONITOR:   Diet advancement, Labs, Weight trends, I & O's, Skin  REASON FOR ASSESSMENT:   Malnutrition Screening Tool    ASSESSMENT:   72 y38 male with h/o CAD, BPH, daily etoh use, ventral hernia s/p repair, TIA and ALS with chronic dysphgia s/p surgical PEG tube placement 08/2021 (20 [address]) who is admitted with SBO.  Met with pt and pt's wife in room today. History is both provided by patient and his wife at bedside. Pt is unable to speak but is able to communicate with a note pad. Pt reports having PEG tube placed in June r/t dysphagia and chronic aspiration. Pt is only supposed to be eating "bites for pleasure" but reports r/t many issues with his tube feeds, he has been eating more by mouth and doing less tube feeds. Pt reports diarrhea and abdominal pain that started right after PEG tube placement. [name] has a well documented diary; pt is having 5-10 liquid stools per day. Pt reports that he has been on IsoSource 1.5, Nutren and compleat with no change in symptoms. Pt has been doing bolus  feeds at home but reports that he has been unable to tolerate all five cartons. Pt is here with SBO so unsure if that is related. RD discussed different tube feed options. RD recommended for patient to try KateDillard Essexmulas as they have lower osmolarity and are often tolerated better with less GI symptoms. Offered patient the option of trying nocturnal tube feeds to see if this would be better tolerated vs bolus feeds. Pt [facility] are deciding whether or not they want to try continuous feeds vs bolus to see if this will help with the diarrhea. Pt has lost 42lbs(20%) over the past year despite having PEG tube placed; this is significant weight loss. RD dicussed with patient the recommendation for him to try and meet 100% of his estimated needs via enteral nutrition so that he can re-gain his weight and try and remain functional for as long as possible. Wife reports that patient is able to walk around drive a car at baseline (pta). Pt is currently weak and debilitated in bed; pt is anxious to work with therapy once medically appropriate.    Pt remains NPO currently with NGT in place and PEG tube to gravity. Pt has had ~1600ml24mtric output. NGT is currently clamped. RD will resume tube feeds once ok per surgery. Pt may require TPN if prolonged NPO is expected. Pt is at high refeed risk.    Medications reviewed and include: lovenox,  LRS w/ 5% dextrose and Kcl '@125ml'$ /hr   Labs reviewed: K 3.3(L), P 3.1 wnl, Mg 2.2 wnl  NUTRITION - FOCUSED PHYSICAL EXAM:  Flowsheet Row Most Recent Value  Orbital Region Severe depletion  Upper Arm Region Severe depletion  Thoracic and Lumbar Region Severe depletion  Buccal Region Severe depletion  Temple Region Severe depletion  Clavicle Bone Region Severe depletion  Clavicle and Acromion Bone Region Severe depletion  Scapular Bone Region Severe depletion  Dorsal Hand Severe depletion  Patellar Region Severe depletion   Anterior Thigh Region Severe depletion  Posterior Calf Region Severe depletion  Edema (RD Assessment) None  Hair Reviewed  Eyes Reviewed  Mouth Reviewed  Skin Reviewed  Nails Reviewed   Diet Order:   Diet Order             Diet NPO time specified Except for: Ice Chips  Diet effective now                  EDUCATION NEEDS:   Education needs have been addressed  Skin:  Skin Assessment: Reviewed RN Assessment (ecchymosis)  Last BM:  2/24  Height:   Ht Readings from Last 1 Encounters:  05/13/22 '6\' 3"'$  (1.905 m)    Weight:   Wt Readings from Last 1 Encounters:  05/11/22 76.2 kg    Ideal Body Weight:  89 kg  BMI:  Body mass index is 21 kg/m.  Estimated Nutritional Needs:   Kcal:  2100-2400kcal/day  Protein:  105-120g/day  Fluid:  2.0-2.3L/day  Donald Distance MS, RD, LDN Please refer to [facility] for RD and/or RD on-call/weekend/after hours pager

## 2022-05-13 NOTE — Evaluation (Signed)
Clinical/Bedside Swallow Evaluation Patient Details  Name: Evaan Readinger MRN: [id number] Date of Birth: [birth year]  Today's Date: 05/13/2022 Time: SLP Start Time (ACUTE ONLY): [id number] SLP Stop Time (ACUTE ONLY): [id number] SLP Time Calculation (min) (ACUTE ONLY): 40 min  Past Medical History:  Past Medical History:  Diagnosis Date   ALS (amyotrophic lateral sclerosis) ([place])    Environmental allergies    Past Surgical History:  Past Surgical History:  Procedure Laterality Date   CATARACT EXTRACTION W/PHACO Right 07/02/2021   Procedure: CATARACT EXTRACTION PHACO AND INTRAOCULAR LENS PLACEMENT ([place]) RIGHT;  Surgeon: Leandrew Koyanagi, MD;  Location: [place];  Service: Ophthalmology;  Laterality: Right;  11.84 1:15.6   RETINAL DETACHMENT SURGERY Left 01/10/2021   [facility]   HPI:  Rorke Pagliarulo is a 72 year old male with past medical history of ALS (dx 03/27/21), severe dysphagia, dysarthria, TIA, elevated PSA, who presented 05/11/22 with severe abdominal pain, nausea and vomiting and was found to have SBO. NG was placed. Patient is known to this Probation officer from outpatient ST, where he has been followed for changes in speech and swallowing since August 2022. Due to worsening dysphagia, NPO has been recommendation for several months; despite known risks, patient has continued to consume regular solids and liquids, and supplement with tube feedings. Due to severity of dysarthria, patient communicates in writing using a "boogie board".    Assessment / Plan / Recommendation  Clinical Impression  Patient presents with severe oral dysphagia and at least mild-moderate pharyngeal dysphagia (per MBS 11/14/2021), and severe risk for aspiration in the context of several oral motor weakness. After oral care performed to remove copious dried secretions, mucous from his tongue body, floor of mouth, teeth, and palate, pt consumed single sips of water with significant anterior oral spillage, appearance of passive  oral transit, with multiple swallows suggestive of reduced pharyngeal clearance as well. Wet vocal quality suggestive of reduced airway protection. Continue to educate pt and wife regarding aspiration risks, particularly with deconditioning and limited mobility, decreased functional reserve with acute illness. Discussed with them strict NPO vs NPO with ice chips, sips of plain water for comfort after oral care. Patient expressed desire to eat and drink by mouth; he and spouse agreeable to sips of water only pending goals of care discussion with palliative care team. SLP Visit Diagnosis: Dysphagia, oropharyngeal phase (R13.12)    Aspiration Risk  Severe aspiration risk    Diet Recommendation NPO;Ice chips PRN after oral care;Free water protocol after oral care   Liquid Administration via: Spoon;Cup Medication Administration: Via alternative means Supervision: Full supervision/cueing for compensatory strategies Compensations: Slow rate;Small sips/bites;Minimize environmental distractions;Other (Comment) (use oral suction to clear secretions/residue) Postural Changes: Seated upright at 90 degrees;Remain upright for at least 30 minutes after po intake    Other  Recommendations Recommended Consults: Other (Comment) (pallitive care, pending, PT consult) Oral Care Recommendations: Oral care QID;Oral care prior to ice chip/H20;Staff/trained caregiver to provide oral care Caregiver Recommendations: Have oral suction available    Recommendations for follow up therapy are one component of a multi-disciplinary discharge planning process, led by the attending physician.  Recommendations may be updated based on patient status, additional functional criteria and insurance authorization.  Follow up Recommendations Outpatient SLP      Assistance Recommended at Discharge    Functional Status Assessment Patient has had a recent decline in their functional status and/or demonstrates limited ability to make  significant improvements in function in a reasonable and predictable amount of time  Frequency and Duration  min 2x/week  2 weeks       Prognosis Prognosis for improved oropharyngeal function: Guarded Barriers to Reach Goals: Severity of deficits;Other (Comment) (prognosis, progressive neurologic disease)      Swallow Study   General Date of Onset: 05/11/22 HPI: Hurshell Blocher is a 72 year old male with past medical history of ALS (dx 03/27/21), severe dysphagia, dysarthria, TIA, elevated PSA, who presented 05/11/22 with severe abdominal pain, nausea and vomiting and was found to have SBO. NG was placed. Patient is known to this Probation officer from outpatient ST, where he has been followed for changes in speech and swallowing since August 2022. Due to worsening dysphagia, NPO has been recommendation for several months; patient has continued to consume regular solids and liquids, and supplement with tube feedings. Due to severity of dysarthria, patient communicates in writing using a "boogie board". Type of Study: Bedside Swallow Evaluation Previous Swallow Assessment: Most recent MBS on 11/14/21 showed moderate to severe oral and mild-moderate pharyngeal dysphagia. Patient has had worsening dysphagia clinically due to progression of ALS, and NPO has been recommendation, however pt has been electing to consume regular solids and thin liquids. Diet Prior to this Study: Thin liquids (Level 0) Temperature Spikes Noted: No Respiratory Status: Room air History of Recent Intubation: No Behavior/Cognition: Alert;Cooperative Oral Cavity Assessment: Dried secretions (Copious mucous and dried secretions removed from palate, tongue, floor of mouth, and teeth) Oral Care Completed by SLP: Yes Oral Cavity - Dentition: Adequate natural dentition Vision: Functional for self-feeding Self-Feeding Abilities: Able to feed self Patient Positioning: Upright in bed Baseline Vocal Quality: Wet;Low vocal intensity Volitional  Cough: Weak Volitional Swallow: Able to elicit (with delay)    Oral/Motor/Sensory Function Overall Oral Motor/Sensory Function: Severe impairment Facial ROM: Reduced right;Reduced left Facial Symmetry: Within Functional Limits Facial Strength: Reduced right;Reduced left Lingual ROM: Reduced right;Reduced left Lingual Symmetry: Other (Comment) (unable to protrude, fasciculations present) Lingual Strength: Reduced Mandible: Impaired   Ice Chips Ice chips: Not tested   Thin Liquid Thin Liquid: Impaired Presentation: Self Fed;Cup Oral Phase Impairments: Reduced labial seal;Reduced lingual movement/coordination Oral Phase Functional Implications: Right anterior spillage;Left anterior spillage;Prolonged oral transit;Oral residue Pharyngeal  Phase Impairments: Wet Vocal Quality;Throat Clearing - Immediate    Nectar Thick Nectar Thick Liquid: Not tested   Honey Thick Honey Thick Liquid: Not tested   Puree Puree: Not tested   Solid   Deneise Lever, [place], [place] Pathologist Office: [phone] [facility]: [phone]  Solid: Not tested      Aliene Altes 05/13/2022,6:11 PM

## 2022-05-13 NOTE — TOC CM/SW Note (Signed)
Per RD, patient will need Dillard Essex 1.4 standard formula at discharge. [facility] only has their Peptide 1.5 so patient would have to use another agency. [place] liaison said they should carry it and she will follow progress. Will need to notify [facility] RD if patient switches to another agency so they can discharge him ([name]: [phone]).  Dayton Scrape, [place]

## 2022-05-13 NOTE — Progress Notes (Signed)
PROGRESS NOTE  Donald Gould [id number] DOB: 1950-11-16   PCP: Derinda Late, MD  Patient is from: Home  DOA: 05/11/2022 LOS: 2  Chief complaints Chief Complaint  Patient presents with   Abdominal Pain     Brief Narrative / Interim history: 72 year old M with PMH of ALS, aphasia/dysarthria and dysphagia on G-tube presenting with severe abdominal pain, nausea and vomiting for about a week and found to have small bowel obstruction as noted on CT abdomen and pelvis which also showed constipation and BPH.  General surgery consulted.  NG tube inserted.  KUB with persistent SBO. General surgery following.   Subjective: Seen and examined earlier this morning.  No major events overnight of this morning.  He denies pain.  No bowel movements.  Thinks he is passing gas.   Objective: Vitals:   05/12/22 1557 05/12/22 1928 05/13/22 0442 05/13/22 0800  BP: (!) 149/79 135/77 133/80 132/76  Pulse: 71 73 67 68  Resp: '16 17 18   '$ Temp: 97.8 F (36.6 C) 97.6 F (36.4 C) 97.6 F (36.4 C) 98.4 F (36.9 C)  TempSrc: Oral   Oral  SpO2: 97% 96% 97% 96%  Weight:        Examination:  GENERAL: No apparent distress.  Nontoxic. HEENT: MMM.  NGT in place. NECK: Supple.  No apparent JVD.  RESP:  No IWOB.  Fair aeration bilaterally. CVS:  RRR. Heart sounds normal.  ABD/GI/GU: BS+.  Abdomen less distended.  Nontender.  G-tube in place. MSK/EXT:   No apparent deformity. Moves extremities. No edema.  SKIN: no apparent skin lesion or wound NEURO: Awake and alert. Oriented appropriately.  No apparent focal neuro deficit. PSYCH: Calm. Normal affect.   Procedures:  None  Microbiology summarized: None  Assessment and plan: Principal Problem:   Small bowel obstruction (HCC) Active Problems:   ALS (amyotrophic lateral sclerosis) (HCC)   Coronary artery disease involving native heart without angina pectoris   Hypercholesterolemia   BPH (benign prostatic hyperplasia)    Constipation  Small bowel obstruction: Presents with nausea, vomiting and abdominal pain. CT abdomen pelvis shows small bowel obstruction with transition point just deep to the anterior abdominal wall at the umbilicus suspicious for adhesions.  KUB with persistent SBO.  Significant output from NG tube. -General surgery following: N.p.o., IV fluid, analgesics, antiemetics and serial KUB.   Amyotrophic lateral sclerosis: -Supportive care.   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  Constipation: CT showed large stool burden -May try Dulcolax suppository  BPH/prostamegaly -Strict intake and output and bladder scan intermittently -Resume home Flomax and Proscar once able to take p.o.  Hypokalemia: -IV KCl 10 mill equivalent x 4 -Add KCl to IV fluid   History of CAD: Stable.  Body mass index is 21 kg/m.          DVT prophylaxis:  enoxaparin (LOVENOX) injection 40 mg Start: 05/12/22 0800  Code Status: Full code Family Communication: None at bedside Level of care: Med-Surg Status is: Inpatient Remains inpatient appropriate because: Small bowel obstruction   Final disposition: Home once medically cleared Consultants:  General surgery  35 minutes with more than 50% spent in reviewing records, counseling patient/family and coordinating care.   Sch Meds:  Scheduled Meds:  enoxaparin (LOVENOX) injection  40 mg Subcutaneous Q24H   Continuous Infusions:  dextrose 5% lactated ringers with KCl 20 mEq/L     PRN Meds:.LORazepam, morphine injection, ondansetron **OR** ondansetron (ZOFRAN) IV  Antimicrobials: Anti-infectives (From admission, onward)  None        I have personally reviewed the following labs and images: CBC: Recent Labs  Lab 05/11/22 1414 05/12/22 0417 05/13/22 0350  WBC 8.8 8.0 7.6  HGB 13.1 13.0 12.8*  HCT 39.6 38.5* 38.0*  MCV 92.3 90.2 90.9  PLT 143* 158 143*   BMP &GFR Recent Labs   Lab 05/11/22 1414 05/12/22 0417 05/13/22 0350  NA 139 139 143  K 3.6 3.5 3.3*  CL 105 107 107  CO2 '27 25 28  '$ GLUCOSE 195* 142* 123*  BUN 30* 25* 22  CREATININE 0.75 0.68 0.60*  CALCIUM 9.1 9.2 9.2  MG  --   --  2.2  PHOS  --   --  3.1   CrCl cannot be calculated (Unknown ideal weight.). Liver & Pancreas: Recent Labs  Lab 05/11/22 1414 05/12/22 0417 05/13/22 0350  AST 40 30  --   ALT 33 29  --   ALKPHOS 56 53  --   BILITOT 0.9 1.1  --   PROT 6.9 6.4*  --   ALBUMIN 4.0 3.8 3.7   Recent Labs  Lab 05/11/22 1414  LIPASE 30   No results for input(s): "AMMONIA" in the last 168 hours. Diabetic: No results for input(s): "HGBA1C" in the last 72 hours. No results for input(s): "GLUCAP" in the last 168 hours. Cardiac Enzymes: No results for input(s): "CKTOTAL", "CKMB", "CKMBINDEX", "TROPONINI" in the last 168 hours. No results for input(s): "PROBNP" in the last 8760 hours. Coagulation Profile: No results for input(s): "INR", "PROTIME" in the last 168 hours. Thyroid Function Tests: No results for input(s): "TSH", "T4TOTAL", "FREET4", "T3FREE", "THYROIDAB" in the last 72 hours. Lipid Profile: No results for input(s): "CHOL", "HDL", "LDLCALC", "TRIG", "CHOLHDL", "LDLDIRECT" in the last 72 hours. Anemia Panel: No results for input(s): "VITAMINB12", "FOLATE", "FERRITIN", "TIBC", "IRON", "RETICCTPCT" in the last 72 hours. Urine analysis: No results found for: "COLORURINE", "APPEARANCEUR", "LABSPEC", "PHURINE", "GLUCOSEU", "HGBUR", "BILIRUBINUR", "KETONESUR", "PROTEINUR", "UROBILINOGEN", "NITRITE", "LEUKOCYTESUR" Sepsis Labs: Invalid input(s): "PROCALCITONIN", "LACTICIDVEN"  Microbiology: No results found for this or any previous visit (from the past 240 hour(s)).  Radiology Studies: DG Abd Portable 1V  Result Date: 05/13/2022 CLINICAL DATA:  Evaluate for obstruction. EXAM: PORTABLE ABDOMEN - 1 VIEW COMPARISON:  05/12/2022 FINDINGS: Gastrostomy tube is again seen within the  left upper quadrant of the abdomen. Persistent dilated small bowel loops identified which measure up to 4 cm in diameter compatible with small bowel obstruction. When compared with the exam from 05/12/2022 the degree of bowel distension is not significantly changed. Moderate stool burden within the colon and rectum again seen. IMPRESSION: 1. Persistent dilated small bowel loops compatible with small bowel obstruction. 2. Moderate stool burden within the colon and rectum. Electronically Signed   By: Kerby Moors M.D.   On: 05/13/2022 09:04   DG Abd Portable 1V-Small Bowel Obstruction Protocol-initial, 8 hr delay  Result Date: 05/12/2022 CLINICAL DATA:  Small bowel obstruction. Abdominal pain. EXAM: PORTABLE ABDOMEN - 1 VIEW COMPARISON:  Recent radiographs. CT 05/11/2022 FINDINGS: Tip of the enteric tube below the diaphragm, side-port in the region of the distal esophagus. Gaseous small bowel distension centrally. Gastrostomy tube is faintly visualized in the left upper quadrant. There is enteric contrast within the stomach and dilated small bowel in the central abdomen. No enteric contrast in the colon. Moderate to large volume of stool throughout the colon. There is stool in the rectum. Rectal distention of 8.1 cm. IMPRESSION: 1. Gaseous small bowel distension centrally. Enteric contrast  in dilated small bowel in the central abdomen. No enteric contrast in the colon. 2. Tip of the enteric tube below the diaphragm, side-port in the region of the distal esophagus. Recommend advancement of at least 5 cm. Electronically Signed   By: Keith Rake M.D.   On: 05/12/2022 22:18      Elham Fini T. [place]  If 7PM-7AM, please contact night-coverage [email or web address] 05/13/2022, 1:43 PM

## 2022-05-13 NOTE — Progress Notes (Signed)
Mobility Specialist - Progress Note   05/13/22 1526  Mobility  Activity Ambulated independently to bathroom  Level of Assistance Modified independent, requires aide device or extra time  Assistive Device Front wheel walker  Distance Ambulated (ft) 6 ft  Activity Response Tolerated well  Mobility Referral Yes  $Mobility charge 1 Mobility   MS responding to bed alarm. Pt amb to the bathroom upon entry, utilizing RA. Pt amb to the bathroom ModI with RW, tolerated well. Pt consistently produced a groaning noise during activity, MS unable to get Pt to express reasoning for sounds during session. Pt left sitting on commode with  call bell within reach.   Candie Mile Mobility Specialist 05/13/22 3:34 PM

## 2022-05-13 NOTE — TOC CM/SW Note (Signed)
  Transition of Care [facility]) Screening Note   Patient Details  Name: Donald Gould Date of Birth: Jul 28, 1950   [facility]) CM/SW Contact:    Candie Chroman, LCSW Phone Number: 05/13/2022, 9:42 AM    Transition of Care Department [facility]) has reviewed patient and no TOC needs have been identified at this time. We will continue to monitor patient advancement through interdisciplinary progression rounds. If new patient transition needs arise, please place a TOC consult.

## 2022-05-14 ENCOUNTER — Inpatient Hospital Stay: Payer: Medicare Other

## 2022-05-14 DIAGNOSIS — E43 Unspecified severe protein-calorie malnutrition: Secondary | ICD-10-CM | POA: Insufficient documentation

## 2022-05-14 DIAGNOSIS — Z7189 Other specified counseling: Secondary | ICD-10-CM | POA: Diagnosis not present

## 2022-05-14 DIAGNOSIS — I2510 Atherosclerotic heart disease of native coronary artery without angina pectoris: Secondary | ICD-10-CM | POA: Diagnosis not present

## 2022-05-14 DIAGNOSIS — K56609 Unspecified intestinal obstruction, unspecified as to partial versus complete obstruction: Secondary | ICD-10-CM | POA: Diagnosis not present

## 2022-05-14 DIAGNOSIS — K5900 Constipation, unspecified: Secondary | ICD-10-CM | POA: Diagnosis not present

## 2022-05-14 DIAGNOSIS — G1221 Amyotrophic lateral sclerosis: Secondary | ICD-10-CM | POA: Diagnosis not present

## 2022-05-14 LAB — RENAL FUNCTION PANEL
Albumin: 3 g/dL — ABNORMAL LOW (ref 3.5–5.0)
Anion gap: 4 — ABNORMAL LOW (ref 5–15)
BUN: 21 mg/dL (ref 8–23)
CO2: 29 mmol/L (ref 22–32)
Calcium: 8.7 mg/dL — ABNORMAL LOW (ref 8.9–10.3)
Chloride: 109 mmol/L (ref 98–111)
Creatinine, Ser: 0.58 mg/dL — ABNORMAL LOW (ref 0.61–1.24)
GFR, Estimated: >60 mL/min (ref >60)
Glucose, Bld: 111 mg/dL — ABNORMAL HIGH (ref 70–99)
Phosphorus: 2.6 mg/dL (ref 2.5–4.6)
Potassium: 3.7 mmol/L (ref 3.5–5.1)
Sodium: 142 mmol/L (ref 135–145)

## 2022-05-14 LAB — CBC
HCT: 36.6 % — ABNORMAL LOW (ref 39.0–52.0)
Hemoglobin: 11.9 g/dL — ABNORMAL LOW (ref 13.0–17.0)
MCH: 30.3 pg (ref 26.0–34.0)
MCHC: 32.5 g/dL (ref 30.0–36.0)
MCV: 93.1 fL (ref 80.0–100.0)
Platelets: 105 10*3/uL — ABNORMAL LOW (ref 150–400)
RBC: 3.93 MIL/uL — ABNORMAL LOW (ref 4.22–5.81)
RDW: 13.6 % (ref 11.5–15.5)
WBC: 4.9 10*3/uL (ref 4.0–10.5)
nRBC: 0 % (ref 0.0–0.2)

## 2022-05-14 LAB — MAGNESIUM: Magnesium: 2 mg/dL (ref 1.7–2.4)

## 2022-05-14 MED ORDER — KATE FARMS STANDARD 1.4 PO LIQD
1680.0000 mL | ORAL | Status: DC
  Administered 2022-05-14 – 2022-05-15 (×2): 1680 mL

## 2022-05-14 MED ORDER — SENNOSIDES 8.8 MG/5ML PO SYRP
5.0000 mL | ORAL_SOLUTION | Freq: Every day | ORAL | Status: DC
  Administered 2022-05-14 – 2022-05-15 (×2): 5 mL
  Filled 2022-05-14 (×2): qty 5

## 2022-05-14 MED ORDER — POLYETHYLENE GLYCOL 3350 17 G PO PACK
17.0000 g | PACK | Freq: Every day | ORAL | Status: DC
  Administered 2022-05-14 – 2022-05-15 (×2): 17 g
  Filled 2022-05-14 (×2): qty 1

## 2022-05-14 MED ORDER — FREE WATER
30.0000 mL | Status: DC
  Administered 2022-05-14 – 2022-05-15 (×5): 30 mL

## 2022-05-14 NOTE — Consult Note (Signed)
Consultation Note Date: 05/14/2022   Patient Name: Donald Gould  DOB: 02/19/1951  MRN: [id number]  Age / Sex: 72 y.o., male  PCP: Derinda Late, MD Referring Physician: Mercy Riding, MD  Reason for Consultation: Establishing goals of care  HPI/Patient Profile: 72 year old M with PMH of ALS, aphasia/dysarthria and dysphagia on G-tube presenting with severe abdominal pain, nausea and vomiting for about a week and found to have small bowel obstruction as noted on CT abdomen and pelvis which also showed constipation and BPH.  General surgery consulted.  NG tube inserted.  KUB with persistent SBO.   Clinical Assessment and Goals of Care: Notes and labs reviewed. In to see patient. Wife at bedside. Patient nods and shakes head but does not speak. Wife states they have been married for 19 years.  They do not have children.  She states he was diagnosed with ALS in 2021.  She discusses the battle with diagnosis and treatment.  She discusses his overall decline.  She discusses that he was told several years ago that he should continue an n.p.o. status as he was not safe to eat and drink.  She states he has been eating and drinking successfully despite recommendations.  We discussed current recommendations and determining care moving forward.  Will follow-up tomorrow for further conversation.    SUMMARY OF RECOMMENDATIONS   PMT will continue to follow.  PMT speaking with SLP regarding their recommendations.  Prognosis:  Poor overall       Primary Diagnoses: Present on Admission:  Small bowel obstruction ([place])  ALS (amyotrophic lateral sclerosis) ([place])  Coronary artery disease involving native heart without angina pectoris  Hypercholesterolemia   I have reviewed the medical record, interviewed the patient and family, and examined the patient. The following aspects are pertinent.  Past Medical History:   Diagnosis Date   ALS (amyotrophic lateral sclerosis) (HCC)    Environmental allergies    Social History   Socioeconomic History   Marital status: Married    Spouse name: Oneal Collaso   Number of children: Not on file   Years of education: Not on file   Highest education level: Not on file  Occupational History   Not on file  Tobacco Use   Smoking status: Former    Types: Cigarettes    Quit date: 1990    Years since quitting: 34.1   Smokeless tobacco: Never  Vaping Use   Vaping Use: Never used  Substance and Sexual Activity   Alcohol use: Yes    Alcohol/week: 11.0 standard drinks of alcohol    Types: 11 Standard drinks or equivalent per week    Comment: 1.5 shots/day   Drug use: Not on file   Sexual activity: Not on file  Other Topics Concern   Not on file  Social History Narrative   Not on file   Social Determinants of Health   Financial Resource Strain: Not on file  Food Insecurity: No Food Insecurity (05/11/2022)   Hunger Vital Sign    Worried About Running  Out of Food in the Last Year: Never true    Ran Out of Food in the Last Year: Never true  Transportation Needs: No Transportation Needs (05/11/2022)   PRAPARE - Hydrologist (Medical): No    Lack of Transportation (Non-Medical): No  Physical Activity: Not on file  Stress: Not on file  Social Connections: Not on file   History reviewed. No pertinent family history. Scheduled Meds:  enoxaparin (LOVENOX) injection  40 mg Subcutaneous Q24H   Continuous Infusions:  dextrose 5% lactated ringers with KCl 20 mEq/L 125 mL/hr at 05/14/22 1026   PRN Meds:.LORazepam, morphine injection, ondansetron **OR** ondansetron (ZOFRAN) IV Medications Prior to Admission:  Prior to Admission medications   Medication Sig Start Date End Date Taking? Authorizing Provider  ASPIRIN 81 PO Take by mouth daily.   Yes [provider]  atropine 1 % ophthalmic solution Place 2 drops under the  tongue 3 (three) times daily.   Yes [provider]  cetirizine (ZYRTEC) 10 MG tablet Take 10 mg by mouth daily.   Yes [provider]  Cholecalciferol (VITAMIN D3) 50 MCG (2000 UT) TABS Take by mouth daily.   Yes [provider]  Cyanocobalamin (VITAMIN B-12) 5000 MCG TBDP Take by mouth daily.   Yes [provider]  ezetimibe (ZETIA) 10 MG tablet Take 10 mg by mouth daily.   Yes [provider]  finasteride (PROSCAR) 5 MG tablet Take 5 mg by mouth daily.   Yes [provider]  glycopyrrolate (ROBINUL) 2 MG tablet Take 2 mg by mouth 3 (three) times daily.   Yes [provider]  ketoconazole (NIZORAL) 2 % shampoo Apply to upper body 3 days a week as a body wash, after 2 months decrease to once weekly 07/25/21  Yes Ralene Bathe, MD  riluzole (RILUTEK) 50 MG tablet Take 50 mg by mouth every 12 (twelve) hours.   Yes [provider]  scopolamine (TRANSDERM-SCOP) 1 MG/3DAYS Place 1 patch onto the skin every 3 (three) days. 03/26/22  Yes [provider]  tamsulosin (FLOMAX) 0.4 MG CAPS capsule Take 0.4 mg by mouth daily.   Yes [provider]  dicyclomine (BENTYL) 20 MG tablet Take by mouth. Patient not taking: Reported on 05/13/2022 04/03/22   [provider]   Allergies  Allergen Reactions   Statins     Joint pain   Review of Systems  Unable to perform ROS   Physical Exam Pulmonary:     Effort: Pulmonary effort is normal.  Neurological:     Mental Status: He is alert.     Vital Signs: BP 127/79 (BP Location: Right Arm)   Pulse (!) 58   Temp 97.6 F (36.4 C) (Oral)   Resp 18   Ht '6\' 3"'$  (1.905 m)   Wt 74.2 kg   SpO2 100%   BMI 20.45 kg/m  Pain Scale: 0-10   Pain Score: 0-No pain   SpO2: SpO2: 100 % O2 Device:SpO2: 100 % O2 Flow Rate: .   IO: Intake/output summary:  Intake/Output Summary (Last 24 hours) at 05/14/2022 1239 Last data filed at 05/14/2022 1012 Gross per 24 hour   Intake 1752.08 ml  Output 325 ml  Net 1427.08 ml    LBM: Last BM Date : 05/14/22 Baseline Weight: Weight: 76.2 kg Most recent weight: Weight: 74.2 kg       Signed by: Asencion Gowda, NP   Please contact Palliative Medicine Team phone at [phone] for  questions and concerns.  For individual provider: See Shea Evans

## 2022-05-14 NOTE — Care Management Important Message (Signed)
Important Message  Patient Details  Name: Donald Gould MRN: [id number] Date of Birth: Jun 22, 1950   Medicare Important Message Given:  N/A - LOS <3 / Initial given by admissions     Dannette Barbara 05/14/2022, 8:20 AM

## 2022-05-14 NOTE — TOC Initial Note (Signed)
Transition of Care [facility]) - Initial/Assessment Note    Patient Details  Name: Donald Gould MRN: [id number] Date of Birth: February 24, 1951  Transition [facility]) CM/SW Contact:    Candie Chroman, LCSW Phone Number: 05/14/2022, 3:24 PM  Clinical Narrative:  Called and updated wife.                Expected Discharge Plan: Home/Self Care Barriers to Discharge: Continued Medical Work up   Patient Goals and CMS Choice            Expected Discharge Plan and Services     Post Acute Care Choice: NA Living arrangements for the past 2 months: Single Family Home                 DME Arranged: Tube feeding DME Agency: Other - Comment [name]) Date DME Agency Contacted: 05/14/22   Representative spoke with at DME Agency: Carolynn Sayers            Prior Living Arrangements/Services Living arrangements for the past 2 months: [place] Lives with:: Spouse Patient language and need for interpreter reviewed:: Yes Do you feel safe going back to the place where you live?: Yes      Need for Family Participation in Patient Care: Yes (Comment) Care giver support system in place?: Yes (comment)   Criminal Activity/Legal Involvement Pertinent to Current Situation/Hospitalization: No - Comment as needed  Activities of Daily Living Home Assistive Devices/Equipment: None ADL Screening (condition at time of admission) Patient's cognitive ability adequate to safely complete daily activities?: Yes Is the patient deaf or have difficulty hearing?: No Does the patient have difficulty seeing, even when wearing glasses/contacts?: Yes Does the patient have difficulty concentrating, remembering, or making decisions?: No Patient able to express need for assistance with ADLs?: Yes Does the patient have difficulty dressing or bathing?: No Independently performs ADLs?: Yes (appropriate for developmental age) Does the patient have difficulty walking or climbing stairs?: Yes Weakness of  Legs: Right Weakness of Arms/Hands: None  Permission Sought/Granted                  Emotional Assessment       Orientation: : Oriented to Self, Oriented to Place, Oriented to  Time, Oriented to Situation Alcohol / Substance Use: Not Applicable Psych Involvement: No (comment)  Admission diagnosis:  Small bowel obstruction ([place]) [K56.609] SBO (small bowel obstruction) ([place]) [K56.609] Patient Active Problem List   Diagnosis Date Noted   Protein-calorie malnutrition, severe 05/14/2022   BPH (benign prostatic hyperplasia) 05/12/2022   Constipation 05/12/2022   Small bowel obstruction ([place]) 05/11/2022   ALS (amyotrophic lateral sclerosis) ([place]) 02/05/2021   Hypercholesterolemia 01/16/2021   Coronary artery disease involving native heart without angina pectoris 06/23/2015   PCP:  Derinda Late, MD Pharmacy:   [id number] [place] [id number]: [phone]: [phone]    Social Determinants of Health (SDOH) Social History: SDOH Screenings   Food Insecurity: No Food Insecurity (05/11/2022)  Housing: Low Risk  (05/11/2022)  Transportation Needs: No Transportation Needs (05/11/2022)  Utilities: Not At Risk (05/11/2022)  Tobacco Use: Medium Risk (05/11/2022)   SDOH Interventions:     Readmission Risk Interventions     No data to display

## 2022-05-14 NOTE — Progress Notes (Signed)
[facility] SURGICAL PROGRESS NOTE (cpt [phone])  Hospital Day(s): 3.   Interval History: Patient seen and examined, no acute events or new complaints overnight. Patient difficult to communicate with given ALS but does answer yes/no questions appropriately. No longer with abdominal pain and thankful to have NGT out aft passing clamping trial. No fever, chills, nausea, emesis. He is passing gas and had BM. G-tube to gravity with minimal output. KUB markedly improved. He is without leukocytosis; WBC 4.9K. Hgb to 11.9. Renal function normal with sCr - 0.58; UO - unmeasured. No electrolyte derangements. He was seen by speech therapy and recommended sips for comfort only.   Review of Systems:  Constitutional: denies fever, chills  HEENT: denies cough or congestion  Respiratory: denies any shortness of breath  Cardiovascular: denies chest pain or palpitations  Gastrointestinal: denied abdominal pain, denied N/V Genitourinary: denies burning with urination or urinary frequency  Vital signs in last 24 hours: [min-max] current  Temp:  [97.9 F (36.6 C)-98.4 F (36.9 C)] 98.1 F (36.7 C) (02/27 0412) Pulse Rate:  [55-68] 55 (02/27 0412) Resp:  [18] 18 (02/27 0412) BP: (123-134)/(76-84) 123/84 (02/27 0412) SpO2:  [96 %-98 %] 98 % (02/27 0412)     Height: '6\' 3"'$  (190.5 cm) Weight: 76.2 kg     Intake/Output last 2 shifts:  02/26 0701 - 02/27 0700 In: 1752.1 [I.V.:1752.1] Out: 1225 [Emesis/NG output:900; Drains:125; Stool:200]   Physical Exam:  Constitutional: alert, cooperative and no distress  HENT: normocephalic without obvious abnormality Eyes: PERRL, EOM's grossly intact and symmetric  Respiratory: breathing non-labored at rest  Cardiovascular: regular rate and sinus rhythm  Gastrointestinal: Soft, no significant tenderness, non-distended, no rebound/guarding. Gastrostomy in LUQ; site CDI and dressed    Labs:     Latest Ref Rng & Units 05/14/2022    4:07 AM 05/13/2022     3:50 AM 05/12/2022    4:17 AM  CBC  WBC 4.0 - 10.5 K/uL 4.9  7.6  8.0   Hemoglobin 13.0 - 17.0 g/dL 11.9  12.8  13.0   Hematocrit 39.0 - 52.0 % 36.6  38.0  38.5   Platelets 150 - 400 K/uL 105  143  158       Latest Ref Rng & Units 05/14/2022    4:07 AM 05/13/2022    3:50 AM 05/12/2022    4:17 AM  CMP  Glucose 70 - 99 mg/dL 111  123  142   BUN 8 - 23 mg/dL '21  22  25   '$ Creatinine 0.61 - 1.24 mg/dL 0.58  0.60  0.68   Sodium 135 - 145 mmol/L 142  143  139   Potassium 3.5 - 5.1 mmol/L 3.7  3.3  3.5   Chloride 98 - 111 mmol/L 109  107  107   CO2 22 - 32 mmol/L '29  28  25   '$ Calcium 8.9 - 10.3 mg/dL 8.7  9.2  9.2   Total Protein 6.5 - 8.1 g/dL   6.4   Total Bilirubin 0.3 - 1.2 mg/dL   1.1   Alkaline Phos 38 - 126 U/L   53   AST 15 - 41 U/L   30   ALT 0 - 44 U/L   29     Imaging studies:   KUB (05/14/2022) personally reviewed with improvement in bowel gas pattern, and radiologist report pending...   Assessment/Plan: (ICD-10's: K20.609) 72 y.o. male with clinically and radiographically resolving SBO, complicated by pertinent comorbidities including ALS.   -  He was able to pass NGT clamping trial with return of bowel function. Unfortunately, given his ALS he is at significant aspiration risk and has been evaluated by SLP. We will continue sips of clears cautiously for comfort onlyu. He will not be able to have formal diet. We can begin enteric feedings today via G-tube and advance slowly as tolerated.    - No indication for emergent surgical intervention currently - Monitor abdominal examination; on-going bowel function  - Serial KUBs as needed  - Pain control prn; antiemetics prn   - Further management per primary service; we will of course follow along  - Discharge Planning: He is improving, will initiate enteric feedings and monitor tolerance. May be able to DC in next 48 hours, or sooner if does very well.   All of the above findings and recommendations were discussed with the  patient, and the medical team, and all of patient's questions were answered to his expressed satisfaction.  -- Edison Simon, PA-C  [facility] 05/14/2022, 7:43 AM M-F: 7am - 4pm

## 2022-05-14 NOTE — Progress Notes (Signed)
PROGRESS NOTE  Donald Gould [id number] DOB: 1950/04/19   PCP: Derinda Late, MD  Patient is from: Home  DOA: 05/11/2022 LOS: 2  Chief complaints Chief Complaint  Patient presents with   Abdominal Pain     Brief Narrative / Interim history: 72 year old M with PMH of ALS, aphasia/dysarthria and dysphagia on G-tube presenting with severe abdominal pain, nausea and vomiting for about a week and found to have small bowel obstruction as noted on CT abdomen and pelvis which also showed constipation and BPH.  General surgery consulted.  NG tube inserted.  KUB on 2/27 with improved ileus versus SBO.  Patient had bowel movements.  NG tube discontinued.  Started on tube feed. General surgery and palliative medicine following.    Subjective: Seen and examined earlier this morning.  No major events overnight of this morning.  No complaints.  Reports 2 bowel movements earlier this morning.  Denies abdominal pain, nausea or vomiting.  Tearful about not eating due to his dysphagia.  Objective: Vitals:   05/12/22 1557 05/12/22 1928 05/13/22 0442 05/13/22 0800  BP: (!) 149/79 135/77 133/80 132/76  Pulse: 71 73 67 68  Resp: '16 17 18   '$ Temp: 97.8 F (36.6 C) 97.6 F (36.4 C) 97.6 F (36.4 C) 98.4 F (36.9 C)  TempSrc: Oral   Oral  SpO2: 97% 96% 97% 96%  Weight:        Examination:  GENERAL: No apparent distress.  Nontoxic. HEENT: MMM.  Vision and hearing grossly intact.  NECK: Supple.  No apparent JVD.  RESP:  No IWOB.  Fair aeration bilaterally. CVS:  RRR. Heart sounds normal.  ABD/GI/GU: BS+. Abd soft, NTND.  G-tube in place. MSK/EXT:   No apparent deformity. Moves extremities. No edema.  SKIN: no apparent skin lesion or wound NEURO: Awake and alert. Oriented appropriately.  No apparent focal neuro deficit. PSYCH: Calm. Normal affect.   Procedures:  None  Microbiology summarized: None  Assessment and plan: Principal Problem:   Small bowel obstruction (HCC) Active  Problems:   ALS (amyotrophic lateral sclerosis) (HCC)   Coronary artery disease involving native heart without angina pectoris   Hypercholesterolemia   BPH (benign prostatic hyperplasia)   Constipation  Small bowel obstruction: Presents with nausea, vomiting and abdominal pain. CT abdomen pelvis shows small bowel obstruction with transition point just deep to the anterior abdominal wall at the umbilicus suspicious for adhesions.  SBO seems to be resolving clinically and radiologically.  Active bowel movement this morning.  No nausea or vomiting. -General surgery following:  -Resume tube feed.   -SLP recommended n.p.o. due to high risk for aspiration.  -Dietitian recommended changing tube feeds to [employer] -Start bowel regimen   Amyotrophic lateral sclerosis: -Supportive care as above. 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 except ice chips. Angelita Ingles as above  Constipation: CT showed large stool burden -Bowel regimen.  BPH/prostamegaly -Strict intake and output and bladder scan intermittently -Resume home Flomax and Proscar once able to take p.o.  Hypokalemia:: Resolved. -Monitor replenish as appropriate.   History of CAD: Stable.  Goal of care: Significant comorbidity as above.  Poor long-term prognosis and quality of life. -Palliative medicine following  Body mass index is 21 kg/m.          DVT prophylaxis:  enoxaparin (LOVENOX) injection 40 mg Start: 05/12/22 0800  Code Status: Full code Family Communication: None at bedside Level of care: Med-Surg Status is: Inpatient Remains inpatient appropriate because: Small bowel  obstruction   Final disposition: Home once medically cleared Consultants:  General surgery Palliative medicine  35 minutes with more than 50% spent in reviewing records, counseling patient/family and coordinating care.   Sch Meds:  Scheduled Meds:  enoxaparin (LOVENOX) injection  40 mg  Subcutaneous Q24H   Continuous Infusions:  dextrose 5% lactated ringers with KCl 20 mEq/L     PRN Meds:.LORazepam, morphine injection, ondansetron **OR** ondansetron (ZOFRAN) IV  Antimicrobials: Anti-infectives (From admission, onward)    None        I have personally reviewed the following labs and images: CBC: Recent Labs  Lab 05/11/22 1414 05/12/22 0417 05/13/22 0350  WBC 8.8 8.0 7.6  HGB 13.1 13.0 12.8*  HCT 39.6 38.5* 38.0*  MCV 92.3 90.2 90.9  PLT 143* 158 143*   BMP &GFR Recent Labs  Lab 05/11/22 1414 05/12/22 0417 05/13/22 0350  NA 139 139 143  K 3.6 3.5 3.3*  CL 105 107 107  CO2 '27 25 28  '$ GLUCOSE 195* 142* 123*  BUN 30* 25* 22  CREATININE 0.75 0.68 0.60*  CALCIUM 9.1 9.2 9.2  MG  --   --  2.2  PHOS  --   --  3.1   CrCl cannot be calculated (Unknown ideal weight.). Liver & Pancreas: Recent Labs  Lab 05/11/22 1414 05/12/22 0417 05/13/22 0350  AST 40 30  --   ALT 33 29  --   ALKPHOS 56 53  --   BILITOT 0.9 1.1  --   PROT 6.9 6.4*  --   ALBUMIN 4.0 3.8 3.7   Recent Labs  Lab 05/11/22 1414  LIPASE 30   No results for input(s): "AMMONIA" in the last 168 hours. Diabetic: No results for input(s): "HGBA1C" in the last 72 hours. No results for input(s): "GLUCAP" in the last 168 hours. Cardiac Enzymes: No results for input(s): "CKTOTAL", "CKMB", "CKMBINDEX", "TROPONINI" in the last 168 hours. No results for input(s): "PROBNP" in the last 8760 hours. Coagulation Profile: No results for input(s): "INR", "PROTIME" in the last 168 hours. Thyroid Function Tests: No results for input(s): "TSH", "T4TOTAL", "FREET4", "T3FREE", "THYROIDAB" in the last 72 hours. Lipid Profile: No results for input(s): "CHOL", "HDL", "LDLCALC", "TRIG", "CHOLHDL", "LDLDIRECT" in the last 72 hours. Anemia Panel: No results for input(s): "VITAMINB12", "FOLATE", "FERRITIN", "TIBC", "IRON", "RETICCTPCT" in the last 72 hours. Urine analysis: No results found for:  "COLORURINE", "APPEARANCEUR", "LABSPEC", "PHURINE", "GLUCOSEU", "HGBUR", "BILIRUBINUR", "KETONESUR", "PROTEINUR", "UROBILINOGEN", "NITRITE", "LEUKOCYTESUR" Sepsis Labs: Invalid input(s): "PROCALCITONIN", "LACTICIDVEN"  Microbiology: No results found for this or any previous visit (from the past 240 hour(s)).  Radiology Studies: DG Abd Portable 1V  Result Date: 05/13/2022 CLINICAL DATA:  Evaluate for obstruction. EXAM: PORTABLE ABDOMEN - 1 VIEW COMPARISON:  05/12/2022 FINDINGS: Gastrostomy tube is again seen within the left upper quadrant of the abdomen. Persistent dilated small bowel loops identified which measure up to 4 cm in diameter compatible with small bowel obstruction. When compared with the exam from 05/12/2022 the degree of bowel distension is not significantly changed. Moderate stool burden within the colon and rectum again seen. IMPRESSION: 1. Persistent dilated small bowel loops compatible with small bowel obstruction. 2. Moderate stool burden within the colon and rectum. Electronically Signed   By: Kerby Moors M.D.   On: 05/13/2022 09:04   DG Abd Portable 1V-Small Bowel Obstruction Protocol-initial, 8 hr delay  Result Date: 05/12/2022 CLINICAL DATA:  Small bowel obstruction. Abdominal pain. EXAM: PORTABLE ABDOMEN - 1 VIEW COMPARISON:  Recent radiographs. CT 05/11/2022  FINDINGS: Tip of the enteric tube below the diaphragm, side-port in the region of the distal esophagus. Gaseous small bowel distension centrally. Gastrostomy tube is faintly visualized in the left upper quadrant. There is enteric contrast within the stomach and dilated small bowel in the central abdomen. No enteric contrast in the colon. Moderate to large volume of stool throughout the colon. There is stool in the rectum. Rectal distention of 8.1 cm. IMPRESSION: 1. Gaseous small bowel distension centrally. Enteric contrast in dilated small bowel in the central abdomen. No enteric contrast in the colon. 2. Tip of the  enteric tube below the diaphragm, side-port in the region of the distal esophagus. Recommend advancement of at least 5 cm. Electronically Signed   By: Keith Rake M.D.   On: 05/12/2022 22:18      Tommey Barret T. [place]  If 7PM-7AM, please contact night-coverage [email or web address] 05/13/2022, 1:43 PM

## 2022-05-14 NOTE — Progress Notes (Signed)
Nutrition Follow Up Note   DOCUMENTATION CODES:   Severe malnutrition in context of chronic illness  INTERVENTION:   [facility] [place] Standard 1.4 '@70ml'$ /hr- Initiate at 18m/hr, once tolerating advance by 161mhr q 8 hours until goal rate is reached.   Free water flushes 3048m4 hours to maintain tube patency   Regimen provides 2352kcal/day, 103g/day protein and 1390m51my of free water  Daily weights   Pt at high refeed risk; recommend monitor potassium, magnesium and phosphorus labs daily until stable  NUTRITION DIAGNOSIS:   Severe Malnutrition related to chronic illness (ALS, dysphagia) as evidenced by severe fat depletion, severe muscle depletion, 10 percent weight loss in 1 year. -ongoing   GOAL:   Patient will meet greater than or equal to 90% of their needs -not met   MONITOR:   Labs, Weight trends, TF tolerance, Skin, I & O's  ASSESSMENT:   72 y72 male with h/o CAD, BPH, daily etoh use, ventral hernia s/p repair, TIA and ALS with chronic dysphgia s/p surgical PEG tube placement 08/2021 (20 Fr [address]) who is admitted with SBO.  NGT removed. Pt is having flatus and BMs. KUB from today reports improving ileus versus small-bowel obstruction. Will plan to initiate trickle tube feeds today and advance as tolerated. [facility]t chronic diarrhea noted with other formulas. Will monitor for tube feed tolerance prior to deciding on pt's home regimen. SLP recommending only bites for pleasure. Pt will need to be tolerating his home tube feed regimen prior to discharge.    Medications reviewed and include: lovenox, miralax, senokot, LRS w/ 5% dextrose and Kcl '@125ml'$ /hr   Labs reviewed: K 3.7 wnl, creat 0.58(L), P 2.6 wnl, Mg 2.0 wnl  Diet Order:   Diet Order             Diet NPO time specified Except for: Ice Chips  Diet effective now                  EDUCATION NEEDS:   Education needs have been addressed  Skin:  Skin Assessment:  Reviewed RN Assessment (ecchymosis)  Last BM:  2/24  Height:   Ht Readings from Last 1 Encounters:  05/13/22 '6\' 3"'$  (1.905 m)    Weight:   Wt Readings from Last 1 Encounters:  05/14/22 74.2 kg    Ideal Body Weight:  89 kg  BMI:  Body mass index is 20.45 kg/m.  Estimated Nutritional Needs:   Kcal:  2100-2400kcal/day  Protein:  105-120g/day  Fluid:  2.0-2.3L/day  CaseKoleen Distance RD, LDN Please refer to [facility] RD and/or RD on-call/weekend/after hours pager

## 2022-05-14 NOTE — Evaluation (Signed)
Speech Language Pathology Evaluation Patient Details Name: Donald Gould MRN: [id number] DOB: 1950/08/30 Today's Date: 05/14/2022 Time: [id number] SLP Time Calculation (min) (ACUTE ONLY): 35 min  Problem List:  Patient Active Problem List   Diagnosis Date Noted   Protein-calorie malnutrition, severe 05/14/2022   BPH (benign prostatic hyperplasia) 05/12/2022   Constipation 05/12/2022   Small bowel obstruction ([place]) 05/11/2022   ALS (amyotrophic lateral sclerosis) ([place]) 02/05/2021   Hypercholesterolemia 01/16/2021   Coronary artery disease involving native heart without angina pectoris 06/23/2015   Past Medical History:  Past Medical History:  Diagnosis Date   ALS (amyotrophic lateral sclerosis) ([place])    Environmental allergies    Past Surgical History:  Past Surgical History:  Procedure Laterality Date   CATARACT EXTRACTION W/PHACO Right 07/02/2021   Procedure: CATARACT EXTRACTION PHACO AND INTRAOCULAR LENS PLACEMENT ([place]) RIGHT;  Surgeon: Leandrew Koyanagi, MD;  Location: [facility];  Service: Ophthalmology;  Laterality: Right;  11.84 1:15.6   RETINAL DETACHMENT SURGERY Left 01/10/2021   [facility]   HPI:  Donald Gould is a 72 year old male with past medical history of ALS (dx 03/27/21), severe dysphagia, dysarthria, TIA, elevated PSA, who presented 05/11/22 with severe abdominal pain, nausea and vomiting and was found to have SBO. NG was placed. Patient is known to this Probation officer from outpatient ST, where he has been followed for changes in speech and swallowing since August 2022. Due to worsening dysphagia, NPO has been recommendation for several months; patient has continued to consume regular solids and liquids, and supplement with tube feedings. Due to severity of dysarthria, patient communicates in writing using a "boogie board".   Assessment / Plan / Recommendation Clinical Impression  Patient presents with severe dysarthria and cognitive impairments secondary to ALS.  Cognition was not formally assessed; due to severity of communication impairment, emphasis placed on communication needs. Patient is able to answer yes/no questions, as well as consent with thumbs up/down (for oral care). Provided communication boards for needs relating to acute care stay. Primary method of communication is using a "boogie board" to write comments, questions, needs at sentence level. Several spelling errors noted, which did not impact pt's message. Using text-to-speech app (vocable), patient able to type responses to simple questions, and demonstrates appropriate scanning of predictive text features to expedite his response. Demonstrated procedure to store/personalize responses. Pt requires mod cues to activate voice output. SLP to follow during acute stay to facilitate effective use of AAC for communication of wants and needs.    SLP Assessment  SLP Recommendation/Assessment: Patient needs continued Speech [place] [facility] SLP Visit Diagnosis: Dysarthria and anarthria (R47.1)    Recommendations for follow up therapy are one component of a multi-disciplinary discharge planning process, led by the attending physician.  Recommendations may be updated based on patient status, additional functional criteria and insurance authorization.    Follow Up Recommendations  Outpatient SLP (tbd pending PT/OT assessments)    Assistance Recommended at Discharge  Intermittent Supervision/Assistance  Functional Status Assessment Patient has had a recent decline in their functional status and/or demonstrates limited ability to make significant improvements in function in a reasonable and predictable amount of time  Frequency and Duration min 2x/week  2 weeks      SLP Evaluation Cognition  Overall Cognitive Status: History of cognitive impairments - at baseline Arousal/Alertness: Awake/alert Orientation Level: Oriented X4       Comprehension  Auditory Comprehension Overall  Auditory Comprehension: Other (comment) (responds to questions appropriately by writing or typing on his phone, appears  to have reduced comprehension of mod complex- complex content) Yes/No Questions: Within Functional Limits Conversation: Simple Other Conversation Comments: at times distracted Interfering Components: Attention EffectiveTechniques: Pausing;Repetition Visual Recognition/Discrimination Discrimination: Not tested    Expression Expression Primary Mode of Expression: Nonverbal - written Verbal Expression Overall Verbal Expression: Other (comment) (expressive language is functional, however limited due to severity of dysarthria. Patient is able to express wants, needs, and ask questions using multimodal communication (writing, typing)) Written Expression Written Expression: Exceptions to [facility] Self Formulation Ability: Sentence (some difficulty spelling but is able to approximate most words related to his wants/needs)   Oral / Motor  Oral Motor/Sensory Function Overall Oral Motor/Sensory Function: Severe impairment Facial ROM: Reduced right;Reduced left Facial Symmetry: Within Functional Limits Facial Strength: Reduced right;Reduced left Lingual ROM: Reduced right;Reduced left Lingual Symmetry: Other (Comment) (unable to protrude, fasciculations) Lingual Strength: Reduced Velum: Impaired right;Impaired left Mandible: Impaired Motor Speech Overall Motor Speech: Impaired Respiration: Impaired Level of Impairment: Phrase Phonation: Wet;Other (comment) (strained/strangled, low pitch) Resonance: Hypernasality Articulation: Impaired Level of Impairment: Word Intelligibility: Intelligibility reduced Word: 0-24% accurate Phrase: 0-24% accurate Sentence: 0-24% accurate Conversation: 0-24% accurate Motor Planning: Witnin functional limits Effective Techniques:  (AAC)           Deneise Lever, MS, Merchandiser, retail Pathologist [phone]  Aliene Altes 05/14/2022, 2:52 PM

## 2022-05-14 NOTE — Progress Notes (Signed)
OT Cancellation Note  Patient Details Name: Donald Gould MRN: [id number] DOB: 03-Oct-1950   Cancelled Treatment:    Reason Eval/Treat Not Completed: Other (comment). OT orders received, chart reviewed. RN reported just starting tube feeds and asked to hold off on mobility at this time. Will re-attempt evaluation tomorrow.   Doneta Public 05/14/2022, 4:19 PM

## 2022-05-15 DIAGNOSIS — Z7189 Other specified counseling: Secondary | ICD-10-CM | POA: Diagnosis not present

## 2022-05-15 DIAGNOSIS — K5900 Constipation, unspecified: Secondary | ICD-10-CM | POA: Diagnosis not present

## 2022-05-15 DIAGNOSIS — K56609 Unspecified intestinal obstruction, unspecified as to partial versus complete obstruction: Secondary | ICD-10-CM | POA: Diagnosis not present

## 2022-05-15 DIAGNOSIS — I2510 Atherosclerotic heart disease of native coronary artery without angina pectoris: Secondary | ICD-10-CM | POA: Diagnosis not present

## 2022-05-15 DIAGNOSIS — G1221 Amyotrophic lateral sclerosis: Secondary | ICD-10-CM | POA: Diagnosis not present

## 2022-05-15 LAB — CBC
HCT: 35 % — ABNORMAL LOW (ref 39.0–52.0)
Hemoglobin: 11.5 g/dL — ABNORMAL LOW (ref 13.0–17.0)
MCH: 30.5 pg (ref 26.0–34.0)
MCHC: 32.9 g/dL (ref 30.0–36.0)
MCV: 92.8 fL (ref 80.0–100.0)
Platelets: 119 10*3/uL — ABNORMAL LOW (ref 150–400)
RBC: 3.77 MIL/uL — ABNORMAL LOW (ref 4.22–5.81)
RDW: 13.4 % (ref 11.5–15.5)
WBC: 4.1 10*3/uL (ref 4.0–10.5)
nRBC: 0 % (ref 0.0–0.2)

## 2022-05-15 LAB — RENAL FUNCTION PANEL
Albumin: 2.8 g/dL — ABNORMAL LOW (ref 3.5–5.0)
Anion gap: 7 (ref 5–15)
BUN: 19 mg/dL (ref 8–23)
CO2: 25 mmol/L (ref 22–32)
Calcium: 8.6 mg/dL — ABNORMAL LOW (ref 8.9–10.3)
Chloride: 106 mmol/L (ref 98–111)
Creatinine, Ser: 0.57 mg/dL — ABNORMAL LOW (ref 0.61–1.24)
GFR, Estimated: >60 mL/min (ref >60)
Glucose, Bld: 112 mg/dL — ABNORMAL HIGH (ref 70–99)
Phosphorus: 3 mg/dL (ref 2.5–4.6)
Potassium: 3.7 mmol/L (ref 3.5–5.1)
Sodium: 138 mmol/L (ref 135–145)

## 2022-05-15 LAB — MAGNESIUM: Magnesium: 1.9 mg/dL (ref 1.7–2.4)

## 2022-05-15 MED ORDER — KATE FARMS STANDARD 1.4 PO LIQD
325.0000 mL | Freq: Every day | ORAL | Status: DC
  Administered 2022-05-16: 325 mL
  Filled 2022-05-15 (×3): qty 325

## 2022-05-15 MED ORDER — POLYETHYLENE GLYCOL 3350 17 G PO PACK: 17.0000 g | PACK | Freq: Every day | ORAL | Status: DC | PRN

## 2022-05-15 MED ORDER — KATE FARMS STANDARD 1.4 PO LIQD
150.0000 mL | Freq: Every day | ORAL | Status: AC
  Administered 2022-05-15 (×3): 150 mL
  Filled 2022-05-15 (×3): qty 325

## 2022-05-15 MED ORDER — FREE WATER
150.0000 mL | Freq: Every day | Status: DC
  Administered 2022-05-15 – 2022-05-16 (×4): 150 mL

## 2022-05-15 MED ORDER — SENNOSIDES 8.8 MG/5ML PO SYRP: 5.0000 mL | ORAL_SOLUTION | Freq: Two times a day (BID) | ORAL | Status: DC | PRN

## 2022-05-15 NOTE — Progress Notes (Signed)
Mobility Specialist - Progress Note   05/15/22 1510  Mobility  Activity Ambulated independently in hallway  Level of Assistance Modified independent, requires aide device or extra time  Assistive Device Front wheel walker  Distance Ambulated (ft) 180 ft  Activity Response Tolerated well  $Mobility charge 1 Mobility   Pt supine upon entry, utilizing RA. Pt competed bed mob ModI, STS to [employer]. Pt amb one lap around NS, tolerated well. Pt returned to room, left supine with alarm set and needs within reach.   Candie Mile Mobility Specialist 05/15/22 3:13 PM

## 2022-05-15 NOTE — Progress Notes (Signed)
Daily Progress Note   Patient Name: Donald Gould       Date: 05/15/2022 DOB: May 15, 1950  Age: 72 y.o. MRN#: [id number] Attending Physician: Mercy Riding, MD Primary Care Physician: Derinda Late, MD Admit Date: 05/11/2022  Reason for Consultation/Follow-up: Establishing goals of care  Subjective: Note and labs reviewed. In to see patient. Wife at bedside.   We discussed his diagnoses, prognosis, GOC, EOL wishes disposition and options.  Created space and opportunity for patient  to explore thoughts and feelings regarding current medical information.   A detailed discussion was had today regarding advanced directives.  Concepts specific to code status, artifical feeding and hydration, IV antibiotics and rehospitalization were discussed.  The difference between an aggressive medical intervention path and a comfort care path was discussed.  Values and goals of care important to patient and family were attempted to be elicited.  Discussed limitations of medical interventions to prolong quality of life in some situations and discussed the concept of human mortality.  Patient is able to write on tablet. He tells me he is a man of faith, and wife states the same.  He states he is worried about leaving his wife alone. They state they will think about all of these options and talk further. He is not ready to make decisions regarding limits to care. Recommend continued conversations with outpatient palliative or [facility] team.   Length of Stay: 4  Current Medications: Scheduled Meds:   enoxaparin (LOVENOX) injection  40 mg Subcutaneous Q24H   feeding supplement ([employer] STANDARD 1.4)  150 mL Per Tube 5 X Daily   [START ON 05/16/2022] feeding supplement ([employer] STANDARD 1.4)  325 mL Per Tube  5 X Daily   free water  150 mL Per Tube 5 X Daily   polyethylene glycol  17 g Per Tube Daily   sennosides  5 mL Per Tube Daily    Continuous Infusions:  dextrose 5% lactated ringers with KCl 20 mEq/L 75 mL/hr at 05/15/22 0915    PRN Meds: LORazepam, morphine injection, ondansetron **OR** ondansetron (ZOFRAN) IV  Physical Exam Pulmonary:     Effort: Pulmonary effort is normal.  Neurological:     Mental Status: He is alert.             Vital Signs: BP 115/74 (BP Location:  Left Arm)   Pulse (!) 57   Temp 97.6 F (36.4 C)   Resp 18   Ht '6\' 3"'$  (1.905 m)   Wt 72.1 kg   SpO2 100%   BMI 19.87 kg/m  SpO2: SpO2: 100 % O2 Device: O2 Device: Room Air O2 Flow Rate:    Intake/output summary:  Intake/Output Summary (Last 24 hours) at 05/15/2022 1359 Last data filed at 05/15/2022 1100 Gross per 24 hour  Intake 240 ml  Output 0 ml  Net 240 ml   LBM: Last BM Date : 05/14/22 Baseline Weight: Weight: 76.2 kg Most recent weight: Weight: 72.1 kg   Patient Active Problem List   Diagnosis Date Noted   Protein-calorie malnutrition, severe 05/14/2022   BPH (benign prostatic hyperplasia) 05/12/2022   Constipation 05/12/2022   Small bowel obstruction ([place]) 05/11/2022   ALS (amyotrophic lateral sclerosis) ([place]) 02/05/2021   Hypercholesterolemia 01/16/2021   Coronary artery disease involving native heart without angina pectoris 06/23/2015    Palliative Care Assessment & Plan    Recommendations/Plan: Recommend outpatient palliative or continued conversations with [facility].   Code Status:    Code Status Orders  (From admission, onward)           Start     Ordered   05/11/22 1921  Full code  Continuous       Question:  By:  Answer:  Consent: discussion documented in EHR   05/11/22 1922           Code Status History     This patient has a current code status but no historical code status.      Advance Directive Documentation    Flowsheet Row Most Recent Value   Type of Advance Directive Healthcare Power of Attorney, Living will  Pre-existing out of facility DNR order (yellow form or pink MOST form) --  "MOST" Form in Place? --       Prognosis:  Poor overall    Thank you for allowing the Palliative Medicine Team to assist in the care of this patient.   Asencion Gowda, NP  Please contact Palliative Medicine Team phone at [phone] for questions and concerns.

## 2022-05-15 NOTE — Progress Notes (Addendum)
[facility] SURGICAL PROGRESS NOTE (cpt [phone])  Hospital Day(s): 4.   Interval History: Patient seen and examined, no acute events or new complaints overnight. Patient difficult to communicate with given ALS but does answer yes/no questions appropriately and today utilizing apps to ask mor complex questions. No longer with abdominal pain. No fever, chills, nausea, emesis. He is passing gas and had BM. Labs remain reassuring. He was seen by speech therapy and recommended sips for comfort only. Tube feedings restarted and advancing without incidence. He is having bowel function. He really wants to go home.   Review of Systems:  Constitutional: denies fever, chills  HEENT: denies cough or congestion  Respiratory: denies any shortness of breath  Cardiovascular: denies chest pain or palpitations  Gastrointestinal: denied abdominal pain, denied N/V Genitourinary: denies burning with urination or urinary frequency  Vital signs in last 24 hours: [min-max] current  Temp:  [97.6 F (36.4 C)-98.4 F (36.9 C)] 97.6 F (36.4 C) (02/28 0738) Pulse Rate:  [52-60] 57 (02/28 0738) Resp:  [15-20] 18 (02/28 0738) BP: (115-129)/(74-87) 115/74 (02/28 0738) SpO2:  [98 %-100 %] 100 % (02/28 0738) Weight:  [72.1 kg] 72.1 kg (02/28 0915)     Height: '6\' 3"'$  (190.5 cm) Weight: 72.1 kg BMI (Calculated): 19.87   Intake/Output last 2 shifts:  No intake/output data recorded.   Physical Exam:  Constitutional: alert, cooperative and no distress  HENT: normocephalic without obvious abnormality Eyes: PERRL, EOM's grossly intact and symmetric  Respiratory: breathing non-labored at rest  Cardiovascular: regular rate and sinus rhythm  Gastrointestinal: Soft, no significant tenderness, non-distended, no rebound/guarding. Gastrostomy in LUQ; receiving enteric feedings    Labs:     Latest Ref Rng & Units 05/15/2022    4:36 AM 05/14/2022    4:07 AM 05/13/2022    3:50 AM  CBC  WBC 4.0 - 10.5 K/uL 4.1   4.9  7.6   Hemoglobin 13.0 - 17.0 g/dL 11.5  11.9  12.8   Hematocrit 39.0 - 52.0 % 35.0  36.6  38.0   Platelets 150 - 400 K/uL 119  105  143       Latest Ref Rng & Units 05/15/2022    4:36 AM 05/14/2022    4:07 AM 05/13/2022    3:50 AM  CMP  Glucose 70 - 99 mg/dL 112  111  123   BUN 8 - 23 mg/dL '19  21  22   '$ Creatinine 0.61 - 1.24 mg/dL 0.57  0.58  0.60   Sodium 135 - 145 mmol/L 138  142  143   Potassium 3.5 - 5.1 mmol/L 3.7  3.7  3.3   Chloride 98 - 111 mmol/L 106  109  107   CO2 22 - 32 mmol/L '25  29  28   '$ Calcium 8.9 - 10.3 mg/dL 8.6  8.7  9.2     Imaging studies: No new imaging studies   Assessment/Plan: (ICD-10's: K61.609) 72 y.o. male with clinically and radiographically resolving SBO, complicated by pertinent comorbidities including ALS.   - Continue sips for comfort only; can not have formal diet; reviewed SLP recommendations  - Continue enteric feedings via G tube  - No indication for emergent surgical intervention currently - Monitor abdominal examination; on-going bowel function  - Pain control prn; antiemetics prn   - Further management per primary service  - Discharge Planning: He has done well, tolerating tube feedings and having bowel function. He really wishes to go home. I think that would be  reasonable this afternoon given how well he is clinically and there is not much more to add here. He has enteric feedings at home which is hs comfortable with.   All of the above findings and recommendations were discussed with the patient, and the medical team, and all of patient's questions were answered to his expressed satisfaction.  -- Edison Simon, PA-C [facility] 05/15/2022, 9:56 AM M-F: 7am - 4pm

## 2022-05-15 NOTE — Progress Notes (Signed)
PROGRESS NOTE  Donald Gould [id number] DOB: 1950/09/10   PCP: Derinda Late, MD  Patient is from: Home  DOA: 05/11/2022 LOS: 2  Chief complaints Chief Complaint  Patient presents with   Abdominal Pain     Brief Narrative / Interim history: 72 year old M with PMH of ALS, aphasia/dysarthria and dysphagia on G-tube presenting with severe abdominal pain, nausea and vomiting for about a week and found to have small bowel obstruction as noted on CT abdomen and pelvis which also showed constipation and BPH.  General surgery consulted.  NG tube inserted.  SBO resolved clinically and radiologically.  NG tube discontinued.  Started on bolus tube feed.  Likely discharge on 2/29 on bolus tube feed.  Note that patient's tube feed has changed and new tube feed DME has been ordered.    Subjective: Seen and examined earlier this morning.  No major events overnight of this morning.  No complaints but very eager to go home.  Reports 2 bowel movements earlier this morning.  Denies pain, nausea or vomiting.  Objective: Vitals:   05/12/22 1557 05/12/22 1928 05/13/22 0442 05/13/22 0800  BP: (!) 149/79 135/77 133/80 132/76  Pulse: 71 73 67 68  Resp: '16 17 18   '$ Temp: 97.8 F (36.6 C) 97.6 F (36.4 C) 97.6 F (36.4 C) 98.4 F (36.9 C)  TempSrc: Oral   Oral  SpO2: 97% 96% 97% 96%  Weight:        Examination:  GENERAL: No apparent distress.  Nontoxic. HEENT: MMM.  Vision and hearing grossly intact.  NECK: Supple.  No apparent JVD.  RESP:  No IWOB.  Fair aeration bilaterally. CVS:  RRR. Heart sounds normal.  ABD/GI/GU: BS+. Abd soft, NTND.  G-tube in place. MSK/EXT:   No apparent deformity. Moves extremities. No edema.  SKIN: no apparent skin lesion or wound NEURO: Awake and alert. Oriented appropriately.  No apparent focal neuro deficit. PSYCH: Calm. Normal affect.   Procedures:  None  Microbiology summarized: None  Assessment and plan: Principal Problem:   Small bowel  obstruction (HCC) Active Problems:   ALS (amyotrophic lateral sclerosis) (HCC)   Coronary artery disease involving native heart without angina pectoris   Hypercholesterolemia   BPH (benign prostatic hyperplasia)   Constipation  Small bowel obstruction/constipation: Presents with N/V/pain.  CT showed SBO and constipation.  SBO and constipation resolved clinically and radiologically.  -Appreciate help by general surgery. -Tube feed per RD.  Changed to [employer] bolus feed. -Change bowel regimen to as needed.   Amyotrophic lateral sclerosis: -Supportive care as above. 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 except ice chips. -Tube feed as above  BPH/prostamegaly -Resume home Flomax and Proscar once able to take p.o. meds safely from SLP standpoint  Hypokalemia:: Resolved. -Monitor replenish as appropriate.   History of CAD: Stable.  Goal of care: Significant comorbidity as above.  Poor long-term prognosis and quality of life. -Palliative medicine following  Body mass index is 21 kg/m.          DVT prophylaxis:  enoxaparin (LOVENOX) injection 40 mg Start: 05/12/22 0800  Code Status: Full code Family Communication: None at bedside Level of care: Med-Surg Status is: Inpatient Remains inpatient appropriate because: Small bowel obstruction   Final disposition: Home on 2/29. Consultants:  General surgery Palliative medicine  35 minutes with more than 50% spent in reviewing records, counseling patient/family and coordinating care.   Sch Meds:  Scheduled Meds:  enoxaparin (LOVENOX) injection  40  mg Subcutaneous Q24H   Continuous Infusions:  dextrose 5% lactated ringers with KCl 20 mEq/L     PRN Meds:.LORazepam, morphine injection, ondansetron **OR** ondansetron (ZOFRAN) IV  Antimicrobials: Anti-infectives (From admission, onward)    None        I have personally reviewed the following labs and  images: CBC: Recent Labs  Lab 05/11/22 1414 05/12/22 0417 05/13/22 0350  WBC 8.8 8.0 7.6  HGB 13.1 13.0 12.8*  HCT 39.6 38.5* 38.0*  MCV 92.3 90.2 90.9  PLT 143* 158 143*   BMP &GFR Recent Labs  Lab 05/11/22 1414 05/12/22 0417 05/13/22 0350  NA 139 139 143  K 3.6 3.5 3.3*  CL 105 107 107  CO2 '27 25 28  '$ GLUCOSE 195* 142* 123*  BUN 30* 25* 22  CREATININE 0.75 0.68 0.60*  CALCIUM 9.1 9.2 9.2  MG  --   --  2.2  PHOS  --   --  3.1   CrCl cannot be calculated (Unknown ideal weight.). Liver & Pancreas: Recent Labs  Lab 05/11/22 1414 05/12/22 0417 05/13/22 0350  AST 40 30  --   ALT 33 29  --   ALKPHOS 56 53  --   BILITOT 0.9 1.1  --   PROT 6.9 6.4*  --   ALBUMIN 4.0 3.8 3.7   Recent Labs  Lab 05/11/22 1414  LIPASE 30   No results for input(s): "AMMONIA" in the last 168 hours. Diabetic: No results for input(s): "HGBA1C" in the last 72 hours. No results for input(s): "GLUCAP" in the last 168 hours. Cardiac Enzymes: No results for input(s): "CKTOTAL", "CKMB", "CKMBINDEX", "TROPONINI" in the last 168 hours. No results for input(s): "PROBNP" in the last 8760 hours. Coagulation Profile: No results for input(s): "INR", "PROTIME" in the last 168 hours. Thyroid Function Tests: No results for input(s): "TSH", "T4TOTAL", "FREET4", "T3FREE", "THYROIDAB" in the last 72 hours. Lipid Profile: No results for input(s): "CHOL", "HDL", "LDLCALC", "TRIG", "CHOLHDL", "LDLDIRECT" in the last 72 hours. Anemia Panel: No results for input(s): "VITAMINB12", "FOLATE", "FERRITIN", "TIBC", "IRON", "RETICCTPCT" in the last 72 hours. Urine analysis: No results found for: "COLORURINE", "APPEARANCEUR", "LABSPEC", "PHURINE", "GLUCOSEU", "HGBUR", "BILIRUBINUR", "KETONESUR", "PROTEINUR", "UROBILINOGEN", "NITRITE", "LEUKOCYTESUR" Sepsis Labs: Invalid input(s): "PROCALCITONIN", "LACTICIDVEN"  Microbiology: No results found for this or any previous visit (from the past 240  hour(s)).  Radiology Studies: DG Abd Portable 1V  Result Date: 05/13/2022 CLINICAL DATA:  Evaluate for obstruction. EXAM: PORTABLE ABDOMEN - 1 VIEW COMPARISON:  05/12/2022 FINDINGS: Gastrostomy tube is again seen within the left upper quadrant of the abdomen. Persistent dilated small bowel loops identified which measure up to 4 cm in diameter compatible with small bowel obstruction. When compared with the exam from 05/12/2022 the degree of bowel distension is not significantly changed. Moderate stool burden within the colon and rectum again seen. IMPRESSION: 1. Persistent dilated small bowel loops compatible with small bowel obstruction. 2. Moderate stool burden within the colon and rectum. Electronically Signed   By: Kerby Moors M.D.   On: 05/13/2022 09:04   DG Abd Portable 1V-Small Bowel Obstruction Protocol-initial, 8 hr delay  Result Date: 05/12/2022 CLINICAL DATA:  Small bowel obstruction. Abdominal pain. EXAM: PORTABLE ABDOMEN - 1 VIEW COMPARISON:  Recent radiographs. CT 05/11/2022 FINDINGS: Tip of the enteric tube below the diaphragm, side-port in the region of the distal esophagus. Gaseous small bowel distension centrally. Gastrostomy tube is faintly visualized in the left upper quadrant. There is enteric contrast within the stomach and dilated small bowel in  the central abdomen. No enteric contrast in the colon. Moderate to large volume of stool throughout the colon. There is stool in the rectum. Rectal distention of 8.1 cm. IMPRESSION: 1. Gaseous small bowel distension centrally. Enteric contrast in dilated small bowel in the central abdomen. No enteric contrast in the colon. 2. Tip of the enteric tube below the diaphragm, side-port in the region of the distal esophagus. Recommend advancement of at least 5 cm. Electronically Signed   By: Keith Rake M.D.   On: 05/12/2022 22:18      Neilah Fulwider T. Seagraves  If 7PM-7AM, please contact night-coverage [email or web address] 05/13/2022,  1:43 PM

## 2022-05-15 NOTE — Progress Notes (Signed)
OT Cancellation Note  Patient Details Name: Donald Gould MRN: [id number] DOB: Jan 23, 1951   Cancelled Treatment:    Reason Eval/Treat Not Completed: OT screened, no needs identified, will sign off. OT orders received, chart reviewed. Pt reports he has been completing functional mobility to the bathroom and toileting routine independently. Pt reports he is at baseline and doesn't feel need for further OT. He reports he still drives. Communicates by writing on "boogie board" and responds well to yes/no questions. Denies any ADL/IADL concerns. No skilled OT needs indicated at this time. Please re-consult if there are any acute changes.     Doneta Public 05/15/2022, 10:19 AM

## 2022-05-15 NOTE — Progress Notes (Signed)
PT Cancellation Note  Patient Details Name: Donald Gould MRN: [id number] DOB: [birth year]   Cancelled Treatment:    Reason Eval/Treat Not Completed: Other (comment). Screen performed this date. Pt received in bathroom, performed hygiene with independence and then able to ambulate back to bed with and without AD. CNA in room for bladder scan. Discussed with patient about follow up therapy. Pt reports he feels at baseline level and doesn't feel need for further PT. He reports he drives and is indep with no recent falls. Pt responds well to yes/no questions in addition to writing on his tablet. Will plan to dc in house at this time.   Donald Gould 05/15/2022, 10:11 AM Greggory Stallion, PT, DPT, GCS [phone]

## 2022-05-15 NOTE — Progress Notes (Signed)
Nutrition Follow Up Note   DOCUMENTATION CODES:   Severe malnutrition in context of chronic illness  INTERVENTION:   Home tube feed regimen:  [employer] Standard 1.4- Give 5 cartons daily via tube. Flush with 66m of water before and after each feed.   Free water flushes [id number] hours to maintain tube patency   Regimen provides 2275kcal/day, 100g/day protein and 192074may of free water  NUTRITION DIAGNOSIS:   Severe Malnutrition related to chronic illness (ALS, dysphagia) as evidenced by severe fat depletion, severe muscle depletion, 10 percent weight loss in 1 year. -ongoing   GOAL:   Patient will meet greater than or equal to 90% of their needs -progressing with tube feeds   MONITOR:   Labs, Weight trends, TF tolerance, Skin, I & O's  ASSESSMENT:   [age]o male with h/o CAD, BPH, daily etoh use, ventral hernia s/p repair, TIA and ALS with chronic dysphgia s/p surgical PEG tube placement 08/2021 (20 [address]) who is admitted with SBO.  Met with pt and wife in room today. Pt reports that he is feeling much better today. Pt reports that he is tolerating the [employer]; pt currently at 66m12m. Pt reports having two bowel movements. Pt reports that he is passing flatus. Pt denies any abdominal pain or distension. Pt would like to continue with the [employer] [place]ula at home; pt prefers bolus feeds. RD will change pt over to his home tube feed regimen to make sure he can tolerate this prior to discharge. Pt has been set up with [facility] to provide tube feed supplies at home as [facility] does not carry [employer] will be for patient to discharge tomorrow if he is tolerating tube feeds.   Medications reviewed and include: lovenox, miralax, senokot, LRS w/ 5% dextrose and Kcl '@125ml'$ /hr   Labs reviewed: K 3.7 wnl, creat 0.57(L), P 3.0 wnl, Mg 1.9 wnl  Diet Order:   Diet Order             Diet NPO time specified Except for: Ice Chips  Diet effective now                   EDUCATION NEEDS:   Education needs have been addressed  Skin:  Skin Assessment: Reviewed RN Assessment (ecchymosis)  Last BM:  2/28- type 6  Height:   Ht Readings from Last 1 Encounters:  05/13/22 '6\' 3"'$  (1.905 m)    Weight:   Wt Readings from Last 1 Encounters:  05/15/22 72.1 kg    Ideal Body Weight:  89 kg  BMI:  Body mass index is 19.87 kg/m.  Estimated Nutritional Needs:   Kcal:  2100-2400kcal/day  Protein:  105-120g/day  Fluid:  2.0-2.3L/day  CaseKoleen Distance RD, LDN Please refer to [facility] RD and/or RD on-call/weekend/after hours pager

## 2022-05-16 ENCOUNTER — Ambulatory Visit: Payer: Medicare Other | Admitting: Podiatry

## 2022-05-16 DIAGNOSIS — N400 Enlarged prostate without lower urinary tract symptoms: Secondary | ICD-10-CM

## 2022-05-16 DIAGNOSIS — E7800 Pure hypercholesterolemia, unspecified: Secondary | ICD-10-CM

## 2022-05-16 DIAGNOSIS — E43 Unspecified severe protein-calorie malnutrition: Secondary | ICD-10-CM

## 2022-05-16 LAB — BASIC METABOLIC PANEL
Anion gap: 7 (ref 5–15)
BUN: 14 mg/dL (ref 8–23)
CO2: 26 mmol/L (ref 22–32)
Calcium: 8.7 mg/dL — ABNORMAL LOW (ref 8.9–10.3)
Chloride: 103 mmol/L (ref 98–111)
Creatinine, Ser: 0.62 mg/dL (ref 0.61–1.24)
GFR, Estimated: >60 mL/min (ref >60)
Glucose, Bld: 108 mg/dL — ABNORMAL HIGH (ref 70–99)
Potassium: 3.6 mmol/L (ref 3.5–5.1)
Sodium: 136 mmol/L (ref 135–145)

## 2022-05-16 LAB — PHOSPHORUS: Phosphorus: 3.8 mg/dL (ref 2.5–4.6)

## 2022-05-16 LAB — MAGNESIUM: Magnesium: 1.9 mg/dL (ref 1.7–2.4)

## 2022-05-16 MED ORDER — CETIRIZINE HCL 5 MG/5ML PO SOLN: 10.0000 mg | Freq: Every day | ORAL | 0 refills | Status: DC

## 2022-05-16 MED ORDER — RILUZOLE 50 MG PO TABS: 50.0000 mg | ORAL_TABLET | Freq: Two times a day (BID) | ORAL | Status: DC

## 2022-05-16 MED ORDER — FREE WATER: 150.0000 mL | Freq: Every day | Status: DC

## 2022-05-16 MED ORDER — POLYETHYLENE GLYCOL 3350 17 GM/SCOOP PO POWD: 17.0000 g | Freq: Two times a day (BID) | ORAL | 2 refills | Status: DC | PRN

## 2022-05-16 MED ORDER — KATE FARMS STANDARD 1.4 PO LIQD: 325.0000 mL | Freq: Every day | ORAL | 2 refills | Status: AC

## 2022-05-16 MED ORDER — SENNOSIDES 8.8 MG/5ML PO SYRP: 5.0000 mL | ORAL_SOLUTION | Freq: Two times a day (BID) | ORAL | 0 refills | Status: DC | PRN

## 2022-05-16 MED ORDER — EZETIMIBE 10 MG PO TABS: 10.0000 mg | ORAL_TABLET | Freq: Every day | ORAL | 1 refills | Status: DC

## 2022-05-16 NOTE — Discharge Summary (Signed)
Physician Discharge Summary  Donald Gould [id number] DOB: August 21, 1950 DOA: 05/11/2022  PCP: Derinda Late, MD  Admit date: 05/11/2022 Discharge date: 05/16/2022 Admitted From: Home Disposition: Home Recommendations for Outpatient Follow-up:  Follow up with PCP in 1 to 2 weeks Recommend ongoing discussion about goals of care and/or referral to palliative care Check CMP, CBC and Mg in 1 week Please follow up on the following pending results: None  Home Health: None Equipment/Devices: Tube feed orders  Discharge Condition: Stable CODE STATUS: Full code  Follow-up Information     [facility] Follow up.   Specialty: General Surgery Why: As needed Contact information: [place] [address] [place] [phone]        Derinda Late, MD. Schedule an appointment as soon as possible for a visit in 1 week(s).   Specialty: Family Medicine Why: Office will call patient to make an appointment 1 week from discharge. Contact information: [address] [facility] [place] [address] [phone]                 Hospital course 72 year old M with PMH of ALS, aphasia/dysarthria and dysphagia on G-tube presenting with severe abdominal pain, nausea and vomiting for about a week and found to have small bowel obstruction as noted on CT abdomen and pelvis which also showed constipation and BPH.  General surgery consulted.  NG tube inserted.  SBO resolved clinically and radiologically.  NG tube discontinued.  Started on bolus tube feed that he tolerated..  Feeding supplement changed to [employer] per recommendation by dietitian.  New tube feed DME ordered.  Patient is discharged in stable condition.  See individual problem list below for more.   Problems addressed during this hospitalization Principal Problem:   Small bowel obstruction ([place]) Active  Problems:   ALS (amyotrophic lateral sclerosis) (HCC)   Coronary artery disease involving native heart without angina pectoris   Hypercholesterolemia   BPH (benign prostatic hyperplasia)   Constipation   Protein-calorie malnutrition, severe   Small bowel obstruction/constipation: Presents with N/V/pain.  CT showed SBO and constipation.  SBO and constipation resolved clinically and radiologically.  -Continue bowel regimen   Amyotrophic lateral sclerosis: -Supportive care as above. 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 except ice chips. -RD recommended bolus tube feed.  Tube feed changed to [employer].   BPH/prostamegaly -Continue home meds   Hypokalemia:: Resolved. -Monitor replenish as appropriate.   History of CAD: Stable.   Goal of care: Significant comorbidity as above.  Poor long-term prognosis and quality of life. -Palliative medicine recommended outpatient follow-up  Severe malnutrition Nutrition Problem: Severe Malnutrition Etiology: chronic illness (ALS, dysphagia) Signs/Symptoms: severe fat depletion, severe muscle depletion, percent weight loss Percent weight loss: 20 %       Vital signs Vitals:   05/15/22 1525 05/15/22 1950 05/16/22 0500 05/16/22 0504  BP: 123/67 124/80  122/73  Pulse: (!) 57 (!) 58  (!) 53  Temp: 98.2 F (36.8 C) 98.3 F (36.8 C)  97.9 F (36.6 C)  Resp: '16 18  18  '$ Height:      Weight:   73 kg   SpO2: 100% 100%  100%  TempSrc: Oral Oral  Oral  BMI (Calculated):   20.12      Discharge exam  GENERAL: No apparent distress.  Nontoxic. HEENT: MMM.  Vision and hearing grossly intact.  NECK: Supple.  No apparent JVD.  RESP:  No IWOB.  Fair aeration bilaterally. CVS:  RRR. Heart sounds normal.  ABD/GI/GU: BS+. Abd soft, NTND.  G-tube in place. MSK/EXT:  Moves extremities. No apparent deformity. No edema.  SKIN: no apparent skin lesion or wound NEURO: Awake and  alert. Oriented appropriately.  No apparent focal neuro deficit. PSYCH: Calm. Normal affect.   Discharge Instructions Discharge Instructions     Call MD for:  persistant nausea and vomiting   Complete by: As directed    Call MD for:  severe uncontrolled pain   Complete by: As directed    Discharge instructions   Complete by: As directed    It has been a pleasure taking care of you!  You were hospitalized due to bowel obstruction and constipation that have resolved.  It is very important to use a bowel regimen to avoid constipation.  Review your new medication list and the directions on your medications before you take them.  Follow-up with your doctors in 1 to 2 weeks or sooner if needed.   Take care,   Increase activity slowly   Complete by: As directed       Allergies as of 05/16/2022       Reactions   Statins    Joint pain        Medication List     STOP taking these medications    ASPIRIN 81 PO   cetirizine 10 MG tablet Commonly known as: ZYRTEC Replaced by: cetirizine HCl 5 MG/5ML Soln   dicyclomine 20 MG tablet Commonly known as: BENTYL   glycopyrrolate 2 MG tablet Commonly known as: ROBINUL   ketoconazole 2 % shampoo Commonly known as: NIZORAL   Vitamin B-12 5000 MCG Tbdp   Vitamin D3 50 MCG (2000 UT) Tabs Generic drug: Cholecalciferol       TAKE these medications    atropine 1 % ophthalmic solution Place 2 drops under the tongue 3 (three) times daily.   cetirizine HCl 5 MG/5ML Soln Commonly known as: EQ Allerg Relief Child (Cetir) Place 10 mLs (10 mg total) into feeding tube daily. Replaces: cetirizine 10 MG tablet   ezetimibe 10 MG tablet Commonly known as: ZETIA Place 1 tablet (10 mg total) into feeding tube daily. What changed: how to take this   feeding supplement (KATE FARMS STANDARD 1.4) Liqd liquid Place 325 mLs into feeding tube 5 (five) times daily.   finasteride 5 MG tablet Commonly known as: PROSCAR Take 5 mg by mouth  daily.   free water Soln Place 150 mLs into feeding tube 5 (five) times daily.   polyethylene glycol powder 17 GM/SCOOP powder Commonly known as: MiraLax Place 17 g into feeding tube 2 (two) times daily as needed for moderate constipation or mild constipation.   riluzole 50 MG tablet Commonly known as: RILUTEK Place 1 tablet (50 mg total) into feeding tube every 12 (twelve) hours. What changed: how to take this   scopolamine 1 MG/3DAYS Commonly known as: TRANSDERM-SCOP Place 1 patch onto the skin every 3 (three) days.   sennosides 8.8 MG/5ML syrup Commonly known as: SENOKOT Place 5 mLs into feeding tube 2 (two) times daily as needed for moderate constipation.   tamsulosin 0.4 MG Caps capsule Commonly known as: FLOMAX Take 0.4 mg by mouth daily.               Durable Medical Equipment  (From admission, onward)           Start  Ordered   05/15/22 1245  For home use only DME Tube feeding  Once       Comments: Dillard [place] Standard 1.4- Give 5 cartons daily via tube. Flush with 68m of water before and after each feed.  Free water flushes 350mq4 hours to maintain tube patency  Regimen provides 2275kcal/day, 100g/day protein and 192046may of free water   05/15/22 1245            Consultations: General surgery Palliative medicine  Procedures/Studies:   DG ABD ACUTE 2+V W 1V CHEST  Result Date: 05/14/2022 CLINICAL DATA:  Ileus.  Small-bowel obstruction. EXAM: DG ABDOMEN ACUTE WITH 1 VIEW CHEST COMPARISON:  05/13/2022. FINDINGS: 0708 hours. Unchanged percutaneous gastrostomy tube. Interval removal of the enteric suction tube. Decreased dilation of small bowel loops with residual mild dilation small bowel loops in the left upper quadrant. No free air under the diaphragm. Clear lungs. No pleural effusion or pneumothorax. Normal heart size and mediastinal contours. IMPRESSION: 1. Improving ileus versus small-bowel obstruction. 2.  No evidence of acute  cardiopulmonary disease. Electronically Signed   By: WalEmmit AlexandersD.   On: 05/14/2022 08:32   DG Abd Portable 1V  Result Date: 05/13/2022 CLINICAL DATA:  Evaluate for obstruction. EXAM: PORTABLE ABDOMEN - 1 VIEW COMPARISON:  05/12/2022 FINDINGS: Gastrostomy tube is again seen within the left upper quadrant of the abdomen. Persistent dilated small bowel loops identified which measure up to 4 cm in diameter compatible with small bowel obstruction. When compared with the exam from 05/12/2022 the degree of bowel distension is not significantly changed. Moderate stool burden within the colon and rectum again seen. IMPRESSION: 1. Persistent dilated small bowel loops compatible with small bowel obstruction. 2. Moderate stool burden within the colon and rectum. Electronically Signed   By: TayKerby MoorsD.   On: 05/13/2022 09:04   DG Abd Portable 1V-Small Bowel Obstruction Protocol-initial, 8 hr delay  Result Date: 05/12/2022 CLINICAL DATA:  Small bowel obstruction. Abdominal pain. EXAM: PORTABLE ABDOMEN - 1 VIEW COMPARISON:  Recent radiographs. CT 05/11/2022 FINDINGS: Tip of the enteric tube below the diaphragm, side-port in the region of the distal esophagus. Gaseous small bowel distension centrally. Gastrostomy tube is faintly visualized in the left upper quadrant. There is enteric contrast within the stomach and dilated small bowel in the central abdomen. No enteric contrast in the colon. Moderate to large volume of stool throughout the colon. There is stool in the rectum. Rectal distention of 8.1 cm. IMPRESSION: 1. Gaseous small bowel distension centrally. Enteric contrast in dilated small bowel in the central abdomen. No enteric contrast in the colon. 2. Tip of the enteric tube below the diaphragm, side-port in the region of the distal esophagus. Recommend advancement of at least 5 cm. Electronically Signed   By: MelKeith RakeD.   On: 05/12/2022 22:18   DG Abd 1 View  Result Date:  05/12/2022 CLINICAL DATA:  Nasogastric tube present. EXAM: ABDOMEN - 1 VIEW COMPARISON:  Radiograph earlier today. FINDINGS: The side port of the enteric tube remains in the distal esophagus, advancement of an additional 5 cm is recommended for optimal placement. Gaseous bowel distention in the upper abdomen persists. IMPRESSION: The side port of the enteric tube remains in the distal esophagus, advancement of an additional 5 cm is recommended for optimal placement. Electronically Signed   By: MelKeith RakeD.   On: 05/12/2022 13:02   DG Abd Portable 1V-Small Bowel Protocol-Position Verification  Result Date: 05/12/2022 CLINICAL DATA:  Nasogastric tube placement. EXAM: PORTABLE ABDOMEN - 1 VIEW COMPARISON:  Radiographs earlier today. FINDINGS: Tip of the enteric tube is below the diaphragm in the stomach, however the side-port is in the distal esophagus. Recommend advancement of at least 6 cm for optimal placement. Small bowel distention in the upper abdomen is similar. IMPRESSION: Tip of the enteric tube below the diaphragm in the stomach, however the side-port is in the distal esophagus. Recommend advancement of at least 6 cm for optimal placement. Electronically Signed   By: Keith Rake M.D.   On: 05/12/2022 11:39   DG ABD ACUTE 2+V W 1V CHEST  Result Date: 05/12/2022 CLINICAL DATA:  72 year old male with evidence of small-bowel obstruction on CT yesterday. EXAM: DG ABDOMEN ACUTE WITH 1 VIEW CHEST COMPARISON:  CT Abdomen and Pelvis yesterday. FINDINGS: PA chest at 0713 hours. Somewhat low lung volumes. Normal cardiac size and mediastinal contours. Visualized tracheal air column is within normal limits. Both lungs appear clear. No pneumothorax or visible pneumoperitoneum. Upright and supine views of the abdomen and pelvis at 0711 hours. Ongoing small bowel obstruction gas pattern. Excreted IV contrast in the urinary bladder. Degree of large bowel gas also appears stable from the CT yesterday. No  pneumoperitoneum identified. Stable gastrostomy tube. No acute osseous abnormality identified. IMPRESSION: 1. Unchanged small bowel obstruction from CT yesterday. No pneumoperitoneum identified. 2. No acute cardiopulmonary abnormality. 3. Stable gastrostomy tube. Excreted IV contrast in the urinary bladder. Electronically Signed   By: Genevie Ann M.D.   On: 05/12/2022 07:24   CT Abdomen Pelvis W Contrast  Result Date: 05/11/2022 CLINICAL DATA:  Abdominal pain and diarrhea. EXAM: CT ABDOMEN AND PELVIS WITH CONTRAST TECHNIQUE: Multidetector CT imaging of the abdomen and pelvis was performed using the standard protocol following bolus administration of intravenous contrast. RADIATION DOSE REDUCTION: This exam was performed according to the departmental dose-optimization program which includes automated exposure control, adjustment of the mA and/or kV according to patient size and/or use of iterative reconstruction technique. CONTRAST:  160m OMNIPAQUE IOHEXOL 300 MG/ML  SOLN COMPARISON:  None Available. FINDINGS: Lower Chest: No acute findings. Hepatobiliary: No hepatic masses identified. Small cyst seen in the inferior right hepatic lobe and adjacent to the gallbladder fossa. Gallbladder is unremarkable. No evidence of biliary ductal dilatation. Pancreas:  No mass or inflammatory changes. Spleen: Within normal limits in size. Tiny sub-cm cyst seen in the superior aspect of the spleen. Adrenals/Urinary Tract: No suspicious masses identified. No evidence of ureteral calculi or hydronephrosis. Stomach/Bowel: Percutaneous gastrostomy tube is seen in expected position. No evidence of free intraperitoneal air. Moderately dilated small bowel loops with air-fluid levels are seen throughout the abdomen and pelvis. A transition point is seen just deep to the anterior abdominal wall at the umbilicus, suspicious for adhesion. No evidence of focal inflammatory process or abscess. Large amount of stool is noted throughout the  length of the colon. No evidence of obstruction, inflammatory process or abnormal fluid collections. Vascular/Lymphatic: No pathologically enlarged lymph nodes. No acute vascular findings. Aortic atherosclerotic calcification incidentally noted. Reproductive:  Markedly enlarged prostate gland noted. Other:  None. Musculoskeletal:  No suspicious bone lesions identified. IMPRESSION: Small bowel obstruction, with transition point just deep to the anterior abdominal wall at the umbilicus, suspicious for adhesion. Large stool burden noted; recommend clinical correlation for possible constipation. Markedly enlarged prostate gland. Aortic Atherosclerosis (ICD10-I70.0). Electronically Signed   By: JMarlaine HindM.D.   On: 05/11/2022 17:28       The results of significant  diagnostics from this hospitalization (including imaging, microbiology, ancillary and laboratory) are listed below for reference.     Microbiology: No results found for this or any previous visit (from the past 240 hour(s)).   Labs:  CBC: Recent Labs  Lab 05/11/22 1414 05/12/22 0417 05/13/22 0350 05/14/22 0407 05/15/22 0436  WBC 8.8 8.0 7.6 4.9 4.1  HGB 13.1 13.0 12.8* 11.9* 11.5*  HCT 39.6 38.5* 38.0* 36.6* 35.0*  MCV 92.3 90.2 90.9 93.1 92.8  PLT 143* 158 143* 105* 119*   BMP &GFR Recent Labs  Lab 05/12/22 0417 05/13/22 0350 05/14/22 0407 05/15/22 0436 05/16/22 0537  NA 139 143 142 138 136  K 3.5 3.3* 3.7 3.7 3.6  CL 107 107 109 106 103  CO2 '25 28 29 25 26  '$ GLUCOSE 142* 123* 111* 112* 108*  BUN 25* '22 21 19 14  '$ CREATININE 0.68 0.60* 0.58* 0.57* 0.62  CALCIUM 9.2 9.2 8.7* 8.6* 8.7*  MG  --  2.2 2.0 1.9 1.9  PHOS  --  3.1 2.6 3.0 3.8   Estimated Creatinine Clearance: 83.6 mL/min (by C-G formula based on SCr of 0.62 mg/dL). Liver & Pancreas: Recent Labs  Lab 05/11/22 1414 05/12/22 0417 05/13/22 0350 05/14/22 0407 05/15/22 0436  AST 40 30  --   --   --   ALT 33 29  --   --   --   ALKPHOS 56 53  --   --    --   BILITOT 0.9 1.1  --   --   --   PROT 6.9 6.4*  --   --   --   ALBUMIN 4.0 3.8 3.7 3.0* 2.8*   Recent Labs  Lab 05/11/22 1414  LIPASE 30   No results for input(s): "AMMONIA" in the last 168 hours. Diabetic: No results for input(s): "HGBA1C" in the last 72 hours. No results for input(s): "GLUCAP" in the last 168 hours. Cardiac Enzymes: No results for input(s): "CKTOTAL", "CKMB", "CKMBINDEX", "TROPONINI" in the last 168 hours. No results for input(s): "PROBNP" in the last 8760 hours. Coagulation Profile: No results for input(s): "INR", "PROTIME" in the last 168 hours. Thyroid Function Tests: No results for input(s): "TSH", "T4TOTAL", "FREET4", "T3FREE", "THYROIDAB" in the last 72 hours. Lipid Profile: No results for input(s): "CHOL", "HDL", "LDLCALC", "TRIG", "CHOLHDL", "LDLDIRECT" in the last 72 hours. Anemia Panel: No results for input(s): "VITAMINB12", "FOLATE", "FERRITIN", "TIBC", "IRON", "RETICCTPCT" in the last 72 hours. Urine analysis: No results found for: "COLORURINE", "APPEARANCEUR", "LABSPEC", "PHURINE", "GLUCOSEU", "HGBUR", "BILIRUBINUR", "KETONESUR", "PROTEINUR", "UROBILINOGEN", "NITRITE", "LEUKOCYTESUR" Sepsis Labs: Invalid input(s): "PROCALCITONIN", "LACTICIDVEN"   SIGNED:  Mercy Riding, MD  [facility] 05/16/2022, 3:02 PM

## 2022-05-16 NOTE — TOC Transition Note (Signed)
Transition of Care [facility]) - CM/SW Discharge Note   Patient Details  Name: Donald Gould MRN: [id number] Date of Birth: [birth year]  Transition of Care [address] [facility]) CM/SW Contact:  Beverly Sessions, RN Phone Number: 05/16/2022, 9:26 AM   Clinical Narrative:     Patient to discharge today [name] with [facility] Confirms that Medical necessity as been signed by MD  Confirmed with Pam at [facility] that she has everything needed for patient to discharge today  Notified [name] at [facility] to discharge patient for tube feeds    Barriers to Discharge: Continued Medical Work up   Patient Goals and CMS Choice      Discharge Placement                         Discharge Plan and Services Additional resources added to the After Visit Summary for       Post Acute Care Choice: NA          DME Arranged: Tube feeding DME Agency: Other - Comment [name]) Date DME Agency Contacted: 05/14/22   Representative spoke with at DME Agency: Carolynn Sayers            Social Determinants of Health ([place]) Interventions SDOH Screenings   Food Insecurity: No Food Insecurity (05/11/2022)  Housing: Low Risk  (05/11/2022)  Transportation Needs: No Transportation Needs (05/11/2022)  Utilities: Not At Risk (05/11/2022)  Tobacco Use: Medium Risk (05/11/2022)     Readmission Risk Interventions     No data to display

## 2022-05-16 NOTE — Progress Notes (Signed)
Donald Gould to be D/C'd Home per MD order.  Discussed prescriptions and follow up appointments with the patient. Prescriptions given to patient, medication list explained in detail. Pt verbalized understanding.  Allergies as of 05/16/2022       Reactions   Statins    Joint pain        Medication List     STOP taking these medications    ASPIRIN 81 PO   cetirizine 10 MG tablet Commonly known as: ZYRTEC Replaced by: cetirizine HCl 5 MG/5ML Soln   dicyclomine 20 MG tablet Commonly known as: BENTYL   glycopyrrolate 2 MG tablet Commonly known as: ROBINUL   ketoconazole 2 % shampoo Commonly known as: NIZORAL   Vitamin B-12 5000 MCG Tbdp   Vitamin D3 50 MCG (2000 UT) Tabs Generic drug: Cholecalciferol       TAKE these medications    atropine 1 % ophthalmic solution Place 2 drops under the tongue 3 (three) times daily.   cetirizine HCl 5 MG/5ML Soln Commonly known as: EQ Allerg Relief Child (Cetir) Place 10 mLs (10 mg total) into feeding tube daily. Replaces: cetirizine 10 MG tablet   ezetimibe 10 MG tablet Commonly known as: ZETIA Place 1 tablet (10 mg total) into feeding tube daily. What changed: how to take this   feeding supplement ([employer] STANDARD 1.4) Liqd liquid Place 325 mLs into feeding tube 5 (five) times daily.   finasteride 5 MG tablet Commonly known as: PROSCAR Take 5 mg by mouth daily.   free water Soln Place 150 mLs into feeding tube 5 (five) times daily.   polyethylene glycol powder 17 GM/SCOOP powder Commonly known as: MiraLax Place 17 g into feeding tube 2 (two) times daily as needed for moderate constipation or mild constipation.   riluzole 50 MG tablet Commonly known as: RILUTEK Place 1 tablet (50 mg total) into feeding tube every 12 (twelve) hours. What changed: how to take this   scopolamine 1 MG/3DAYS Commonly known as: TRANSDERM-SCOP Place 1 patch onto the skin every 3 (three) days.   sennosides 8.8 MG/5ML  syrup Commonly known as: SENOKOT Place 5 mLs into feeding tube 2 (two) times daily as needed for moderate constipation.   tamsulosin 0.4 MG Caps capsule Commonly known as: FLOMAX Take 0.4 mg by mouth daily.               Durable Medical Equipment  (From admission, onward)           Start     Ordered   05/15/22 1245  For home use only DME Tube feeding  Once       Comments: [employer] Standard 1.4- Give 5 cartons daily via tube. Flush with 27m of water before and after each feed.  Free water flushes 372mq4 hours to maintain tube patency  Regimen provides 2275kcal/day, 100g/day protein and 192034may of free water   05/15/22 1245            Vitals:   05/15/22 1950 05/16/22 0504  BP: 124/80 122/73  Pulse: (!) 58 (!) 53  Resp: 18 18  Temp: 98.3 F (36.8 C) 97.9 F (36.6 C)  SpO2: 100% 100%    Skin clean, dry and intact without evidence of skin break down, no evidence of skin tears noted. IV catheter discontinued intact. Site without signs and symptoms of complications. Dressing and pressure applied. Pt denies pain at this time. No complaints noted.  An After Visit Summary was printed and given to the  patient. Patient escorted via [place], and D/C home via private auto.  Donald Gould

## 2022-05-16 NOTE — Progress Notes (Cosign Needed)
     [facility] REFERRAL        Occupational Therapy * Physical Therapy * Speech Therapy                           DATE 05/16/22   PATIENT NAME Donald Gould  PATIENT MRN [id number]       DIAGNOSIS/DIAGNOSIS CODE SBO  DATE OF DISCHARGE: 05/16/22        PRIMARY CARE PHYSICIAN    [place]  PCP PHONE/FAX   [phone]       Dear Provider (Name: [facility] outpatient __  Fax: [phone]   I certify that I have examined this patient and that occupational/physical/speech therapy is necessary on an outpatient basis.    The patient has expressed interest in completing their recommended course of therapy at your  location.  Once a formal order from the patient's primary care physician has been obtained, please  contact him/her to schedule an appointment for evaluation at your earliest convenience.   [ x]  Physical Therapy Evaluate and Treat  [  ]  Occupational Therapy Evaluate and Treat  [ x ]  Speech Therapy Evaluate and Treat         The patient's primary care physician (listed above) must furnish and be responsible for a formal order such that the recommended services may be furnished while under the primary physician's care, and that the plan of care will be established and reviewed every 30 days (or more often if condition necessitates).

## 2022-05-21 ENCOUNTER — Telehealth: Payer: Self-pay

## 2022-05-21 NOTE — Telephone Encounter (Signed)
Initial Palliative care visit/consult scheduled for Fri 3/15 @ noon, per spouse request.

## 2022-05-21 NOTE — Telephone Encounter (Signed)
TC to spouse to discuss PC referral from PCP and schedule initial consult. Spouse was a bit confused on referral and wanted to confirm/verify with PCP and/or hospital on referral prior to scheduling visit.  SW will await spouse return call.

## 2022-05-23 ENCOUNTER — Ambulatory Visit: Payer: Medicare Other | Admitting: Speech Pathology

## 2022-05-31 ENCOUNTER — Other Ambulatory Visit: Payer: No Typology Code available for payment source

## 2022-05-31 VITALS — BP 120/72 | HR 78 | Temp 97.6°F | Wt 166.0 lb

## 2022-05-31 DIAGNOSIS — Z515 Encounter for palliative care: Secondary | ICD-10-CM

## 2022-05-31 NOTE — Progress Notes (Signed)
COMMUNITY PALLIATIVE CARE SW NOTE  PATIENT NAME: Donald Gould DOB: [birth year] MRN: [id number]  PRIMARY CARE PROVIDER: Derinda Late, MD  RESPONSIBLE PARTY:  Acct ID - Guarantor Home Phone Work Phone Relationship Acct Type  [id number] - [place],[name] [phone]  Self P/F     [place], [place], [place] [address]     PLAN OF CARE and INTERVENTIONS:              GOALS OF CARE/ ADVANCE CARE PLANNING:    Goals include to maximize quality of life and assist with pain management. Our advance care planning conversation included a discussion about:    The value and importance of advance care planning  Review and updating or creation of an advance directive document.                          Code Status: Full code                         ACD: patients spouse holds POA. Patient has living will.         GOC: ongoing discussion. Poor long-term prognosis and quality of life.  2.        SOCIAL/EMOTIONAL/SPIRITUAL ASSESSMENT/ INTERVENTIONS:         Palliative care encounter: SW and RN completed initial in home visit with patient and patients spouse. PC referral received post hospital stay from PCP Dr. Zola Button. Palliative care services discussed again and consent form left with patient to review and sign at next visit.   Presenting problem: patient with recent hospital stay 2/26-2/29 due to small bowel obstruction. Patient with ALS, BPH, and protein calorie malnutrition.   Mobility: patient sates that he is able to do a lot of "normal" things despite his ALS dx. Patient states that he is able to hang pictures, water the plants and other household duties. Patient is (I) with ADL's but is slower now due to his R leg being weaker now. HH therapy discussed as it was not set up post DC from hospital. Patient declines the use of a RW. Patient is interested in [facility] PT to assist with strengthening.  Sleeping: patient is sleeping well.  Appetite: fair. Patient gets his nutrition from tube feeding.     Psychosocial assessment: completed.   In home support: Patients spouse is primary caregiver.   Transportation: no needs.  Food: no food insecurities witnessed.   Safety and long term planning: patient feels safe in his home and desires to remain in his home with spouse.    SW discussed goals, reviewed care plan, provided emotional support, used active and reflective listening in the form of reciprocity emotional response. Questions and concerns were addressed. The patient/family was encouraged to call with any additional questions and/or concerns. PC Provided general support and encouragement, no other unmet needs identified. Will continue to follow.  Next palliative care visit scheduled for 4/16.   3.         PATIENT/CAREGIVER EDUCATION/ COPING:   Appearance: well groomed, appropriate given situation  Mental Status: Alert and oriented Eye Contact: good. Patient wears glasses  Thought Process: rational  Thought Content: not assessed  Speech: Patients speech is impacted by his ALS Mood: Normal and calm Affect: Congruent to endorsed mood, full ranging Insight: normal Judgement: normal Interaction Style: Cooperative   Patient A&O and able to make needs known through writing. Patient engaged in fluent conversation and provided hx  on medical complexities. Patient able to answer all questions. Patient communicated via writing. Patient occasionally had teary/crying spells when talking about code status and other certain issues, mostly due to ALS effect.  Social hx: patient was a Chief Strategy Officer. Patient has a brother that lives in [place]. No other support outside of church members.   4.         PERSONAL EMERGENCY PLAN:  spouse will call 9-1-1 for emergencies.    5.         COMMUNITY RESOURCES COORDINATION/ HEALTH CARE NAVIGATION:  patient and spouse manages his care.    6.      FINANCIAL CONCERNS/NEEDS: none                         Primary Health Insurance:  Medicare Secondary Health  Insurance: mutual of [place] Prescription Coverage: Yes, no history of difficulty obtaining or affording prescriptions reported.     SOCIAL HX:  Social History   Tobacco Use   Smoking status: Former    Types: Cigarettes    Quit date: 1990    Years since quitting: 34.2   Smokeless tobacco: Never  Substance Use Topics   Alcohol use: Yes    Alcohol/week: 11.0 standard drinks of alcohol    Types: 11 Standard drinks or equivalent per week    Comment: 1.5 shots/day    CODE STATUS: FULL CODE ADVANCED DIRECTIVES: Y MOST FORM COMPLETE:  N HOSPICE EDUCATION PROVIDED: N  PPS: 50%      [name], LCSW

## 2022-05-31 NOTE — Progress Notes (Signed)
PATIENT NAME: Donald Gould DOB: 1950/04/12 MRN: [id number]  PRIMARY CARE PROVIDER: Derinda Late, MD  RESPONSIBLE PARTY:  Acct ID - Guarantor Home Phone Work Phone Relationship Acct Type  [id number] - [name],[name] [phone]  Self P/F     [place], [place], [place] [address]    Home visit completed with patient, wife and [name], [name].   ACP:   Full code status.  Living will in place.  Will continue discusses regarding goals of care.  Patient is very tearful when discussing this.    ALS: Patient remains ambulatory.  Still able to do his own ADL's.  Helping wife with some household chores.  Feeding tube in place due to dysphagia.  Patient is managing this on his own.  Patient endorses missing eating food.  Wife believes patient has lost about 50+ lbs in the last 6 months.  Using a writing board to communicate during this visit.  Noted excessive drooling and frequently wiping his mouth.  Constipation under control after recent hospitalization for SBO.     Functional Status:  Patient is completing ADL"s on his own.  Has grab bar in place for stability in the shower.  Not using a cane or walker.   Most recent fall in in the last couple of weeks at [facility] but this was likely due to the floor being wet.  No injuries reported.  Patient advised he is able to take the trash out and is still able to do normal activities.   Palliative Care:  Explained PC services to wife and patient.  Both agreeable to continue with services.  Right Leg Weakness:  Stopped driving due to weakness in the right leg.  Wife feels this is more constant now.  Appears referral was requested for home health in the hospital but patient has not been contacted by a home health agency.  Will follow up with PCP office regarding home health PT/ST.  315 pm. Phone call made to PCP office regarding referral for home health.  Follow up visit scheduled for next month.   CODE STATUS: Full ADVANCED DIRECTIVES: Yes uploaded  today.  MOST FORM: No PPS: 50%   PHYSICAL EXAM:   VITALS: Today's Vitals   05/31/22 1212  BP: 120/72  Pulse: 78  Temp: 97.6 F (36.4 C)  SpO2: 97%  Weight: 166 lb (75.3 kg)    LUNGS: clear to auscultation  CARDIAC: Cor RRR}  EXTREMITIES: right 2+ pitting edema and left 1+ pitting edema SKIN: Skin color, texture, turgor normal. No rashes or lesions or mobility and turgor normal  NEURO: negative for dizziness and gait problems       Lorenza Burton, RN

## 2022-06-03 ENCOUNTER — Ambulatory Visit (INDEPENDENT_AMBULATORY_CARE_PROVIDER_SITE_OTHER): Payer: Medicare Other | Admitting: Podiatry

## 2022-06-03 DIAGNOSIS — M79675 Pain in left toe(s): Secondary | ICD-10-CM | POA: Diagnosis not present

## 2022-06-03 DIAGNOSIS — B351 Tinea unguium: Secondary | ICD-10-CM | POA: Diagnosis not present

## 2022-06-03 DIAGNOSIS — M79674 Pain in right toe(s): Secondary | ICD-10-CM

## 2022-06-04 ENCOUNTER — Telehealth: Payer: Self-pay

## 2022-06-04 NOTE — Telephone Encounter (Signed)
1015 am.  Follow up call made to wife regarding outpatient therapy.  She is okay for outpatient PT/ST.  Advised that I would contact PCP office to let them know.    Phone call made to PCP office to advised wife is okay to proceed with Outpatient PT/ST services.

## 2022-06-04 NOTE — Progress Notes (Signed)
  Subjective:  Patient ID: Donald Gould, male    DOB: Mar 17, 1951,  MRN: [id number]  Chief Complaint  Patient presents with   Nail Problem    Thick painful toenails, 3 month follow up    72 y.o. male presents with the above complaint. History confirmed with patient.  Nails are thickened elongated causing pain and discomfort in shoe gear, he notes previous debridements have been very helpful in reducing pain and improving function.  His wife is here with him today.  He has ALS and is able to communicate with a pad.  He notes he bumped the third toe on the left foot.  Objective:  Physical Exam: warm, good capillary refill, no trophic changes or ulcerative lesions, normal DP and PT pulses, and normal sensory exam. Left Foot: dystrophic yellowed discolored nail plates with subungual debris and subungual hematoma left third toe, no onycholysis or active bleeding noted Right Foot: dystrophic yellowed discolored nail plates with subungual debris  Assessment:   1. Pain due to onychomycosis of toenails of both feet      Plan:  Patient was evaluated and treated and all questions answered.  Discussed the etiology and treatment options for the condition in detail with the patient.  Previous debridement has been helpful.. Recommended debridement of the nails today. Sharp and mechanical debridement performed of all painful and mycotic nails today. Nails debrided in length and thickness using a nail nipper to level of comfort. Discussed treatment options including appropriate shoe gear. Follow up as needed for painful nails.  Discussed presence of the bruise on third toenail.  There is no onycholysis or evidence of today to indicate that removal immediately is necessary.    Return in about 10 weeks (around 08/12/2022).

## 2022-06-06 ENCOUNTER — Ambulatory Visit: Payer: Medicare Other | Admitting: Speech Pathology

## 2022-06-20 ENCOUNTER — Ambulatory Visit: Payer: Medicare Other | Attending: Neurology | Admitting: Speech Pathology

## 2022-06-20 DIAGNOSIS — R1312 Dysphagia, oropharyngeal phase: Secondary | ICD-10-CM | POA: Insufficient documentation

## 2022-06-20 DIAGNOSIS — R471 Dysarthria and anarthria: Secondary | ICD-10-CM | POA: Insufficient documentation

## 2022-06-20 NOTE — Therapy (Signed)
OUTPATIENT SPEECH LANGUAGE PATHOLOGY TREATMENT NOTE AND RECERTIFICATION   Patient Name: Donald Gould MRN: [id number] DOB:06/09/1950, 72 y.o., male Today's Date: 06/20/2022   PCP: Derinda Late, MD REFERRING PROVIDER: Silvestre Moment, MD/ now under care of Ronne Binning, MD    End of Session - 06/20/22 1430     Visit Number 25    Number of Visits 28    Date for SLP Re-Evaluation 09/18/22    SLP Start Time 55    SLP Stop Time  1400    SLP Time Calculation (min) 60 min    Activity Tolerance Patient tolerated treatment well               Past Medical History:  Diagnosis Date   ALS (amyotrophic lateral sclerosis) ([place])    Environmental allergies    Past Surgical History:  Procedure Laterality Date   CATARACT EXTRACTION W/PHACO Right 07/02/2021   Procedure: CATARACT EXTRACTION PHACO AND INTRAOCULAR LENS PLACEMENT ([place]) RIGHT;  Surgeon: Leandrew Koyanagi, MD;  Location: [facility];  Service: Ophthalmology;  Laterality: Right;  11.84 1:15.6   RETINAL DETACHMENT SURGERY Left 01/10/2021   [facility]   Patient Active Problem List   Diagnosis Date Noted   Protein-calorie malnutrition, severe 05/14/2022   BPH (benign prostatic hyperplasia) 05/12/2022   Constipation 05/12/2022   Small bowel obstruction 05/11/2022   ALS (amyotrophic lateral sclerosis) 02/05/2021   Hypercholesterolemia 01/16/2021   Coronary artery disease involving native heart without angina pectoris 06/23/2015    ONSET DATE: 10/23/2020   referral date; pt reports gradual onset of speech symptoms beginning in 2020   REFERRING DIAG: speech disturbance, dysphagia   HPI: Patient is a 72 year old male with past medical history of TIA, elevated PSA, initially referred by neurologist in [facility] for slurred speech and dysphagia. 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 Patient had gradual speech changes beginning in 2020. This has been progressive, and patient began to notice difficulty with swallowing.  Repeat MBS 11/14/21: "Patient presents with moderate to severe oral dysphagia and mild-moderate pharyngeal dysphagia. Oral stage is characterized by severely weak lingual manipulation and essentially passive anterior to posterior transfer (pt tilts head posteriorly to facilitate transit of bolus), reduced bolus cohesion, and moderate oral residue. Lingual movement is primarily limited to superior-inferior, with minimal anterior-posterior or lateral movement. This impacts bolus formation, transfer and oral clearance. Absence of rotary mastication; pt retains bolus in the anterior oral cavity and uses a munching pattern to prepare bolus with front teeth, as well as mashing tip of tongue to the alveolar ridge. With solids, pt requires small amount of thin liquid to aid transfer to pharynx. The only texture pt was not able to manipulate/transfer was pudding (bolus removed with SLP assistance). Swallow initiation is delayed with prolonged pooling in the valleculae and pyriform sinuses. Due to base of tongue weakness, reduced hyolaryngeal excursion, and impaired pharyngeal constriction, there is residue that remains in the valleculae and pyriform sinuses post-swallow (mild with thin liquids, but increasing with thicker textures). Penetration of residue which spills from the pyriform sinuses into the laryngeal vestibule post-swallow occurs intermittently, however pt is able to fully eject penetration with throat clear or cough; there is no aspiration. Pt is able to reduce residue with subsequent swallows (and liquid wash for solids). Limited trials to SMALL boluses (1.5cm x 1.5  cm) and small sips of liquid. Patient has cognitive abilities sufficient to follow verbal commands for this, and  demonstrates slow, careful, thorough mastication. Requires 2-3 additional swallows and liquid wash to reduce pharyngeal residue of solid to a functional degree, and is able to maintain airway clearance with intermittent throat clearing as instructed. Given the level of effort required to manipulate, transfer, and clear boluses, patient is unlikely to be able to maintain adequate nutrition and hydration by oral means, especially considering potential for declining function with fatigue. However, he is encouraged to continue consuming preferred foods and liquids for comfort/pleasure in bite size amounts (1.5 cm by 1.5 cm or smaller, to reduce risk for choking and to facilitate efficient mastication/minimize residue), and discuss nutritional needs for primary nutrition via PEG with his dietitian, as well as medications via PEG with pharmacy. Continue OP SLP for monitoring swallow function clinically as well as communication needs."  THERAPY DIAG:  Dysarthria and anarthria  Dysphagia, oropharyngeal phase  Rationale for Evaluation and Treatment Habilitation  SUBJECTIVE: Pt was hospitalized in February for SBO.  Pt accompanied by:  Wife [name] PAIN:  Are you having pain? No   OBJECTIVE:   TODAY'S TREATMENT:  Pt reports now that he is tolerating tube feeds (switched during hospitalization to [facility]), he is no longer eating or drinking by mouth. However, pt then stated he took Tamulosin orally this morning due to it clogging his tube yesterday (he reports he was able to flush). Educated on aspiration/choking risk especially with large pill; spouse has requested guidance from MD. Pt used Vocable app successfully during his hospitalization, however is primarily using Garden at home. Spouse reports some difficulty/confusion when [place] attempts to communicate when she is driving. Added sections for pt's brother Elenore Rota and spouse, for [place] to store messages for text-to-speech when using his [employer] is not practical. Also generated new category for pt to pre-program questions for his medical team for more efficient communication during appointments. Demonstrated category and phrase editing to pt and spouse; pt returned demonstration by programming 2 questions for his upcoming appointment at the [place] [facility] next week (min cues). Pt benefits from using stylus to improve accuracy with typing; spouse has stylus at home pt can use.   PATIENT EDUCATION: Education details: check with MD re: Tamulosin for alternative options via tube if concerns for clogging, as taking orally is not recommended due to aspiration risk Person educated: Patient and Spouse Education method: Explanation, Demonstration, Verbal cues, and Handouts Education comprehension: verbalized understanding, verbal cues required, and needs further education   UPDATED GOALS: Goals reviewed with patient? Yes  LONG TERM GOALS: Target date: 09/18/2022  Patient will improve communication efficiency/effectiveness by identifying and storing appropriate messages with modified independence, over 2 consecutive visits. Baseline:  Goal status: INITIAL  2.  Patient will use text-to-speech to engage in conversation via phone or in person, with a friend, family member, or medical provider over 3 consecutive weeks.  Baseline:  Goal status: INITIAL       SLP Short Term Goals - 03/13/21 1642  (COMPLETED)      SLP SHORT TERM GOAL #1   Title Patient will demonstrate dysarthria HEP with modified independence.    Time 10    Period --   visits   Status Achieved      SLP SHORT TERM GOAL #2   Title Patient will use compensations for dysarthria for paragraph reading level 90% accuracy.    Time 10  Period Weeks    Status Achieved      SLP SHORT TERM GOAL #3   Title Patient will complete clinical swallowing assessment with goals added PRN    Time 2    Period Weeks    Status Achieved               SLP Long Term Goals   (COMPLETED)      SLP LONG TERM GOAL #1   Title Pt will attempt repair of communication breakdowns using multimodal communication/AAC in 75% of opportunities.   Time 12    Period Weeks    Status Met     SLP LONG TERM GOAL #2   Title Patient will verbalize signs/symptoms of aspiration and aspiration pneumonia.    Time 12    Period Weeks    Status Met     SLP LONG TERM GOAL #3   Title Patient will demonstrate knowledge of external aids/apps for augmentative/alternative communication    Time 12    Period Weeks    Status Met   SLP LONG TERM GOAL #4  Title Patient will utilize recommended precautions while consuming pleasure feeds with min cues.  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 [name] is now using [employer] consistently for communication in sessions, and reports using this with family. Pt now adhering to recommendations for NPO, aside from taking one pill orally this morning; he was urged to discuss alternatives with MD due to high aspiration risk. Recertification completed today with goals updated; will continue ST with focus on improving communication effectiveness using multimodal means/AAC.    Speech Therapy Frequency 1x per month   Duration 12 weeks    Treatment/Interventions Aspiration precaution training;Cueing hierarchy;SLP instruction and feedback;Pharyngeal strengthening exercises;Compensatory techniques;Functional tasks;Compensatory strategies;Diet toleration management by SLP;Trials of upgraded texture/liquids;Multimodal communcation approach;Internal/external aids;Patient/family education    Potential to Achieve Goals Fair    Potential Considerations Medical prognosis   progressive neurologic etiology    Consulted and Agree with Plan of Care Patient;Family member/caregiver    Family Member Consulted wife             Deneise Lever, [place], [place] Pathologist [phone]   Aliene Altes, [place] 06/20/2022, 2:31 PM

## 2022-07-02 ENCOUNTER — Other Ambulatory Visit: Payer: No Typology Code available for payment source

## 2022-07-02 VITALS — BP 110/60 | HR 71 | Temp 97.3°F

## 2022-07-02 DIAGNOSIS — Z515 Encounter for palliative care: Secondary | ICD-10-CM

## 2022-07-02 NOTE — Progress Notes (Signed)
PATIENT NAME: Donald Gould DOB: [birth year] MRN: [id number]  PRIMARY CARE PROVIDER: Kandyce Rud, MD  RESPONSIBLE PARTY:  Acct ID - Guarantor Home Phone Work Phone Relationship Acct Type  [id number] - [place],[name] [phone]  Self P/F     [address] [name], [place] [address]    Home visit with patient and wife.   ACP:  Remains a full code.  Wife states she is not sure how much patient understands at this point.  She has spoken with neurology but is not comfortable with making changes on behalf of patient.  Will continue to discuss and provide education on disease progression.  Hospice:  Patient was seen by [facility] on 06/25/22.  Discussion completed on hospice support and referral was place.  Wife has not heard from anyone.  We further discussed hospice support and I explained differences between Palliative Care and Hospice.  Advised that Hospice and therapy services could not be in place at the same time.  Wife would like to hold off on hospice referral at this time.  Patient just completed his 3rd session of therapy and he feels this has been helpful with strengthening his legs.  I discussed with wife we could request a hospice referral once patient completed therapy.  Should his condition worsen before that point and a referral be needed, wife is aware she can contact myself or PCP.  Mucous:  Patient notes an increase is mucous production.  He reports pulling a large amount of mucous from his mouth last week.  Patient is using his cough machine to help clear.  Wife will monitor more closely to see if this is effective.   Thrush:  Present in mouth.  This has been ongoing since last hospitalization.  No treatment in place.  Patient is unable to swallow due to ALS.  I offered to contact PCP to address.  Wife advised they are seeing the dentist tomorrow and will address with him.  I advised if dentist is unable to address I would be happy to contact PCP.  CODE STATUS:  Full ADVANCED DIRECTIVES: Yes MOST FORM: No PPS: 50%   PHYSICAL EXAM:   VITALS: Today's Vitals   07/02/22 1005  BP: 110/60  Pulse: 71  Temp: (!) 97.3 F (36.3 C)  SpO2: 94%    LUNGS: clear to auscultation  CARDIAC: Cor RRR}  EXTREMITIES: right lower extremity with 1+ edema SKIN: Skin color, texture, turgor normal. No rashes or lesions or mobility and turgor normal  NEURO: negative for dizziness, headaches, and vertigo       Truitt Merle, RN

## 2022-07-04 ENCOUNTER — Ambulatory Visit: Payer: Medicare Other | Admitting: Speech Pathology

## 2022-07-18 ENCOUNTER — Ambulatory Visit: Payer: Medicare Other | Admitting: Speech Pathology

## 2022-08-01 ENCOUNTER — Ambulatory Visit: Payer: Medicare Other | Admitting: Speech Pathology

## 2022-08-01 ENCOUNTER — Other Ambulatory Visit: Payer: No Typology Code available for payment source

## 2022-08-01 DIAGNOSIS — Z515 Encounter for palliative care: Secondary | ICD-10-CM

## 2022-08-02 ENCOUNTER — Other Ambulatory Visit: Payer: No Typology Code available for payment source

## 2022-08-02 NOTE — Progress Notes (Signed)
COMMUNITY PALLIATIVE CARE SW NOTE  PATIENT NAME: Donald Gould DOB: 01-10-1951 MRN: [id number]  PRIMARY CARE PROVIDER: Kandyce Rud, MD  RESPONSIBLE PARTY:  Acct ID - Guarantor Home Phone Work Phone Relationship Acct Type  [id number] - [name],[name] [phone]  Self P/F     [address] [name], [place] [address]                               Follow-up Palliative Care Visit/Clinical SW  PC SW completed a telephonic encounter with patient's wife to obtain a status update on patient, assess any needs and provide support.  She report that patient overall condition remains unchanged. He continues to receive nutrition via a feeding tube. He has not had any pain issues. He did have a fall this morning in the garage and did not receive any injuries. He walks independently, but has poor safety awareness due to cognitive deficits. His wife advises that she has purchased him trekking poles, but he refuses to use them. He continues to have increased mucus production,but wife feels it is slightly better as she is monitoring him and using his cough machine as much as possible. SW reiterated education and benefits of hospice and encouraged her to consider the services as patient's condition declines. She is did not want to make any changes to his code status at this time.   SW assessed for any further needs and concerns, which the wife had none. SW advised that the palliative care nurse will follow-up with patient /PCG in the next 2-4 weeks.    Social History   Tobacco Use   Smoking status: Former    Types: Cigarettes    Quit date: 1990    Years since quitting: 34.3   Smokeless tobacco: Never  Substance Use Topics   Alcohol use: Yes    Alcohol/week: 11.0 standard drinks of alcohol    Types: 11 Standard drinks or equivalent per week    Comment: 1.5 shots/day    CODE STATUS: Full Code ADVANCED DIRECTIVES: Yes MOST FORM COMPLETE:  No HOSPICE EDUCATION PROVIDED: Yes  PPS:  50%  Duration of encounter and documentation: 60 minutes  Jakalyn Kratky, LCSW

## 2022-08-15 ENCOUNTER — Ambulatory Visit: Payer: Medicare Other | Admitting: Speech Pathology

## 2022-08-26 ENCOUNTER — Ambulatory Visit (INDEPENDENT_AMBULATORY_CARE_PROVIDER_SITE_OTHER): Payer: Medicare Other | Admitting: Podiatry

## 2022-08-26 VITALS — BP 148/62

## 2022-08-26 DIAGNOSIS — M79675 Pain in left toe(s): Secondary | ICD-10-CM | POA: Diagnosis not present

## 2022-08-26 DIAGNOSIS — M79674 Pain in right toe(s): Secondary | ICD-10-CM | POA: Diagnosis not present

## 2022-08-26 DIAGNOSIS — B351 Tinea unguium: Secondary | ICD-10-CM | POA: Diagnosis not present

## 2022-08-29 ENCOUNTER — Ambulatory Visit: Payer: Medicare Other | Admitting: Speech Pathology

## 2022-08-31 ENCOUNTER — Encounter: Payer: Self-pay | Admitting: Podiatry

## 2022-08-31 NOTE — Progress Notes (Signed)
  Subjective:  Patient ID: Donald Gould, male    DOB: 1950-11-30,  MRN: [id number]  Donald Gould presents to clinic today for: painful elongated mycotic toenails 1-5 bilaterally which are tender when wearing enclosed shoe gear. Pain is relieved with periodic professional debridement. Patient is accompanied by his wife on today's visit. He has h/o ALS. Chief Complaint  Patient presents with   Nail Problem    RFC,Referring Provider Kandyce Rud, MD,lov:05/24       PCP is Kandyce Rud, MD.  Allergies  Allergen Reactions   Other Other (See Comments)    Hip pain  Muscle and joint issues.  Joint pain    Hip pain  Muscle and joint issues.   Statins Rash    Joint pain    Review of Systems: Negative except as noted in the HPI.  Objective: No changes noted in today's physical examination. Vitals:   08/26/22 1348  BP: (!) 148/62    Donald Gould is a pleasant 72 y.o. male in NAD. AAO x 3.  Vascular Examination: Capillary refill time <3 seconds b/l LE. Palpable pedal pulses b/l LE. Digital hair present b/l. No pedal edema b/l. Skin temperature gradient WNL b/l. No varicosities b/l. Marland Kitchen  Dermatological Examination: Pedal skin with normal turgor, texture and tone b/l. No open wounds. No interdigital macerations b/l. Toenails 1-5 b/l thickened, discolored, dystrophic with subungual debris. There is pain on palpation to dorsal aspect of nailplates. No hyperkeratotic nor porokeratotic lesions present on today's visit.Marland Kitchen  Neurological Examination: Protective sensation intact with 10 gram monofilament b/l LE.   Musculoskeletal Examination: Muscle strength 4/5 to all lower extremity muscle groups bilaterally. No pain, crepitus or joint limitation noted with ROM bilateral LE. No gross bony deformities bilaterally. Utilizes cane for ambulation assistance.  Assessment/Plan: 1. Pain due to onychomycosis of toenails of both feet     Patient was evaluated and treated. All  patient's and/or POA's questions/concerns addressed on today's visit. Toenails 1-5 debrided in length and girth without incident. Continue soft, supportive shoe gear daily. Report any pedal injuries to medical professional. Call office if there are any questions/concerns. -Patient/POA to call should there be question/concern in the interim.   Return in about 10 weeks (around 11/04/2022).  Freddie Breech, DPM

## 2022-09-05 ENCOUNTER — Emergency Department: Payer: Medicare Other

## 2022-09-05 ENCOUNTER — Emergency Department
Admission: EM | Admit: 2022-09-05 | Discharge: 2022-09-05 | Disposition: A | Discharge status: Home / Self Care | Payer: Medicare Other | Attending: Emergency Medicine | Admitting: Emergency Medicine

## 2022-09-05 ENCOUNTER — Other Ambulatory Visit: Payer: Self-pay

## 2022-09-05 DIAGNOSIS — S0003XA Contusion of scalp, initial encounter: Secondary | ICD-10-CM | POA: Insufficient documentation

## 2022-09-05 DIAGNOSIS — T07XXXA Unspecified multiple injuries, initial encounter: Secondary | ICD-10-CM

## 2022-09-05 DIAGNOSIS — S0990XA Unspecified injury of head, initial encounter: Secondary | ICD-10-CM | POA: Diagnosis present

## 2022-09-05 DIAGNOSIS — I2510 Atherosclerotic heart disease of native coronary artery without angina pectoris: Secondary | ICD-10-CM | POA: Diagnosis not present

## 2022-09-05 DIAGNOSIS — S80211A Abrasion, right knee, initial encounter: Secondary | ICD-10-CM | POA: Diagnosis not present

## 2022-09-05 DIAGNOSIS — W19XXXA Unspecified fall, initial encounter: Secondary | ICD-10-CM

## 2022-09-05 DIAGNOSIS — S40212A Abrasion of left shoulder, initial encounter: Secondary | ICD-10-CM | POA: Insufficient documentation

## 2022-09-05 DIAGNOSIS — W01198A Fall on same level from slipping, tripping and stumbling with subsequent striking against other object, initial encounter: Secondary | ICD-10-CM | POA: Insufficient documentation

## 2022-09-05 NOTE — ED Triage Notes (Signed)
ALS patient from home brought in by EMS for fall; Patient fell backwards hitting the back of his head; Per EMS, no loss of consciousness and patient does not take any blood thinners; A&O x 4 upon arrival (writes to communicate); Abrasions to RIGHT knee and LEFT shoulder

## 2022-09-05 NOTE — ED Provider Notes (Signed)
[name] Provider Note    Event Date/Time   First MD Initiated Contact with Patient 09/05/22 1337     (approximate)   History   Chief Complaint Fall (ALS patient from home brought in by EMS for fall; Patient fell backwards hitting the back of his head; Per EMS, no loss of consciousness and patient does not take any blood thinners; A&O x 4 upon arrival (writes to communicate); Abrasions to RIGHT knee and LEFT shoulder)   HPI  Donald Gould is a 72 y.o. male with past medical history of hyperlipidemia, CAD, and ALS who presents to the ED following fall.  Patient is non-verbal at baseline due to ALS so wife provides majority of history.  She reports that the patient was out on their back deck attempting to water some plants when he apparently tripped and fell.  She came home to find him splayed out on the deck with abrasions to his head, left shoulder, left elbow, and right knee.  He has complained of pain at his left knee and thigh since the fall, does not take any blood thinners.  He denies any chest pain or abdominal pain.      Physical Exam   Triage Vital Signs: ED Triage Vitals [09/05/22 1323]  Enc Vitals Group     BP (!) 141/78     Pulse Rate 78     Resp 20     Temp 98.1 F (36.7 C)     Temp Source Oral     SpO2 98 %     Weight 166 lb 0.1 oz (75.3 kg)     Height 6\' 3"  (1.905 m)     Head Circumference      Peak Flow      Pain Score      Pain Loc      Pain Edu?      Excl. in GC?     Most recent vital signs: Vitals:   09/05/22 1323  BP: (!) 141/78  Pulse: 78  Resp: 20  Temp: 98.1 F (36.7 C)  SpO2: 98%    Constitutional: Awake and alert. Eyes: Conjunctivae are normal. Head: Abrasion to posterior scalp, no laceration or step-off noted. Nose: No congestion/rhinnorhea. Mouth/Throat: Mucous membranes are moist.  Neck: No midline cervical spine tenderness to palpation. Cardiovascular: Normal rate, regular rhythm. Grossly normal  heart sounds.  2+ radial pulses bilaterally. Respiratory: Normal respiratory effort.  No retractions. Lungs CTAB.  No chest wall tenderness to palpation. Gastrointestinal: Soft and nontender. No distention. Musculoskeletal: Diffuse tenderness to palpation of left shoulder and left elbow with no obvious deformity.  No tenderness to palpation of right upper extremity.  Diffuse tenderness to left knee with no obvious deformity, no tenderness to right lower extremity. Neurologic: Communicates via writing board. No gross focal neurologic deficits are appreciated.    ED Results / Procedures / Treatments   Labs (all labs ordered are listed, but only abnormal results are displayed) Labs Reviewed - No data to display  RADIOLOGY CT head reviewed and interpreted by me with no hemorrhage or midline shift.  X-ray of left shoulder reviewed and interpreted by me with no fracture or dislocation.  PROCEDURES:  Critical Care performed: No  Procedures   MEDICATIONS ORDERED IN ED: Medications - No data to display   IMPRESSION / MDM / ASSESSMENT AND PLAN / ED COURSE  I reviewed the triage vital signs and the nursing notes.  72 y.o. male with past medical history of hyperlipidemia, CAD, and ALS who presents to the ED following fall where he struck his head but did not lose consciousness, now complains of left arm and right knee pain.  Patient's presentation is most consistent with acute complicated illness / injury requiring diagnostic workup.  Differential diagnosis includes, but is not limited to, intracranial injury, cervical spine injury, extremity injury.  Patient well-appearing and in no acute distress, vital signs are unremarkable.  CT imaging of his head and cervical spine are negative for acute process, he has an abrasion to his posterior scalp but no laceration in need of repair.  X-ray imaging of left shoulder, elbow, and left knee are unremarkable.  He has an  abrasion to his right knee but no bony tenderness and range of motion is intact.  No evidence of injury to his trunk.  He is appropriate for discharge home with PCP follow-up, wife counseled to have him return to the ED for new or worsening symptoms.  Wife agrees with plan.      FINAL CLINICAL IMPRESSION(S) / ED DIAGNOSES   Final diagnoses:  Fall, initial encounter  Contusion of scalp, initial encounter  Abrasions of multiple sites     Rx / DC Orders   ED Discharge Orders     None        Note:  This document was prepared using Dragon voice recognition software and may include unintentional dictation errors.   Chesley Noon, MD 09/05/22 [phone]

## 2022-09-06 ENCOUNTER — Other Ambulatory Visit: Payer: No Typology Code available for payment source

## 2022-09-06 DIAGNOSIS — Z515 Encounter for palliative care: Secondary | ICD-10-CM

## 2022-09-09 NOTE — Progress Notes (Signed)
PATIENT NAME: Rhodes Calvert DOB: 24-Oct-1950 MRN: [id number]  PRIMARY CARE PROVIDER: Kandyce Rud, MD  RESPONSIBLE PARTY:  Acct ID - Guarantor Home Phone Work Phone Relationship Acct Type  [id number] - [place],[name] [phone]  Self P/F     [address] [name], [place] [address]    I connected with [name]-wife of Beatriz Settles on 09/09/22 by telephone and verified that I am speaking with the correct person using two identifiers.   I discussed the limitations of evaluation and management by telemedicine. The patient expressed understanding and agreed to proceed.    Connected with wife [name] by phone regarding patient status.  She is seeking assistance with private duty sitting/ADL assistance for patient.  He currently has a LTC policy that will pay for services.  [name] is asking if [facility] provides this.  Advised that [facility] program does not and provided names such as [facility], [facility] and [place] services.  She will follow up with these agencies regarding coverage.  [name] advised patient had been evaluated by [facility] but was deemed ineligible at this time.  Patient remains mobile but wife is concerned for patient safety now and does not feel he can remain home alone any longer.  He is requiring more assistance with ADL's.  Follow up visit scheduled for 2 weeks.         Truitt Merle, RN

## 2022-09-12 ENCOUNTER — Ambulatory Visit: Payer: Medicare Other | Admitting: Speech Pathology

## 2022-09-14 DIAGNOSIS — D649 Anemia, unspecified: Secondary | ICD-10-CM

## 2022-09-14 HISTORY — DX: Anemia, unspecified: D64.9

## 2022-09-26 ENCOUNTER — Ambulatory Visit: Payer: Medicare Other | Admitting: Speech Pathology

## 2022-10-10 ENCOUNTER — Ambulatory Visit: Payer: Medicare Other | Admitting: Speech Pathology

## 2022-10-24 ENCOUNTER — Ambulatory Visit: Payer: Medicare Other | Admitting: Speech Pathology

## 2022-11-04 ENCOUNTER — Ambulatory Visit (INDEPENDENT_AMBULATORY_CARE_PROVIDER_SITE_OTHER): Payer: Medicare Other | Admitting: Podiatry

## 2022-11-04 DIAGNOSIS — M79674 Pain in right toe(s): Secondary | ICD-10-CM | POA: Diagnosis not present

## 2022-11-04 DIAGNOSIS — M79675 Pain in left toe(s): Secondary | ICD-10-CM | POA: Diagnosis not present

## 2022-11-04 DIAGNOSIS — B351 Tinea unguium: Secondary | ICD-10-CM

## 2022-11-04 NOTE — Progress Notes (Signed)
  Subjective:  Patient ID: Donald Gould, male    DOB: [birth year],  MRN: [id number]  Amaud Gould presents to clinic today for: painful thick toenails that are difficult to trim. Pain interferes with ambulation. Aggravating factors include wearing enclosed shoe gear. Pain is relieved with periodic professional debridement. [name] has h/o ALS and is accompanied by his wife on today's visit. Chief Complaint  Patient presents with   Nail Problem    RFC,Referring Provider Kandyce Rud, MD,lov:08/24        PCP is Kandyce Rud, MD.  Allergies  Allergen Reactions   Other Other (See Comments)    Hip pain  Muscle and joint issues.  Joint pain    Hip pain  Muscle and joint issues.   Statins Rash    Joint pain    Review of Systems: Negative except as noted in the HPI.  Objective: No changes noted in today's physical examination. There were no vitals filed for this visit.  Donald Gould is a pleasant 72 y.o. male in NAD. AAO x 3.  Vascular Examination: Capillary refill time <3 seconds b/l LE. Palpable pedal pulses b/l LE. Digital hair present b/l. No pedal edema b/l. Skin temperature gradient WNL b/l. No varicosities b/l. Marland Kitchen  Dermatological Examination: Pedal skin with normal turgor, texture and tone b/l. No open wounds. No interdigital macerations b/l. Toenails 1-5 b/l thickened, discolored, dystrophic with subungual debris. There is pain on palpation to dorsal aspect of nailplates. No corns, calluses nor porokeratotic lesions noted..  Neurological Examination: Protective sensation intact with 10 gram monofilament b/l LE.   Musculoskeletal Examination: Muscle strength 4/5 to all lower extremity muscle groups bilaterally.  Assessment/Plan: 1. Pain due to onychomycosis of toenails of both feet     -Patient's family member present. All questions/concerns addressed on today's visit. -No new findings. No new orders. -Patient to continue soft, supportive shoe gear  daily. -Mycotic toenails 1-5 bilaterally were debrided in length and girth with sterile nail nippers and dremel without incident. -Patient/POA to call should there be question/concern in the interim.   Return in about 10 weeks (around 01/13/2023).  Donald Gould, DPM

## 2022-11-07 ENCOUNTER — Ambulatory Visit: Payer: Medicare Other | Admitting: Speech Pathology

## 2022-11-09 ENCOUNTER — Encounter: Payer: Self-pay | Admitting: Podiatry

## 2022-11-21 ENCOUNTER — Ambulatory Visit: Payer: Medicare Other | Admitting: Speech Pathology

## 2022-11-28 ENCOUNTER — Ambulatory Visit: Payer: Medicare Other | Admitting: Speech Pathology

## 2022-12-01 ENCOUNTER — Emergency Department: Payer: Medicare Other

## 2022-12-01 ENCOUNTER — Inpatient Hospital Stay
Admission: EM | Admit: 2022-12-01 | Discharge: 2022-12-17 | DRG: 871 | Disposition: E | Payer: Medicare Other | Attending: Internal Medicine | Admitting: Internal Medicine

## 2022-12-01 ENCOUNTER — Other Ambulatory Visit: Payer: Self-pay

## 2022-12-01 DIAGNOSIS — Z79899 Other long term (current) drug therapy: Secondary | ICD-10-CM

## 2022-12-01 DIAGNOSIS — N400 Enlarged prostate without lower urinary tract symptoms: Secondary | ICD-10-CM | POA: Diagnosis present

## 2022-12-01 DIAGNOSIS — F419 Anxiety disorder, unspecified: Secondary | ICD-10-CM | POA: Diagnosis present

## 2022-12-01 DIAGNOSIS — Z1152 Encounter for screening for COVID-19: Secondary | ICD-10-CM | POA: Diagnosis not present

## 2022-12-01 DIAGNOSIS — Z888 Allergy status to other drugs, medicaments and biological substances status: Secondary | ICD-10-CM

## 2022-12-01 DIAGNOSIS — E43 Unspecified severe protein-calorie malnutrition: Secondary | ICD-10-CM | POA: Diagnosis present

## 2022-12-01 DIAGNOSIS — J690 Pneumonitis due to inhalation of food and vomit: Secondary | ICD-10-CM | POA: Diagnosis present

## 2022-12-01 DIAGNOSIS — J9809 Other diseases of bronchus, not elsewhere classified: Secondary | ICD-10-CM | POA: Diagnosis present

## 2022-12-01 DIAGNOSIS — R64 Cachexia: Secondary | ICD-10-CM | POA: Diagnosis present

## 2022-12-01 DIAGNOSIS — R54 Age-related physical debility: Secondary | ICD-10-CM | POA: Diagnosis present

## 2022-12-01 DIAGNOSIS — Z87891 Personal history of nicotine dependence: Secondary | ICD-10-CM | POA: Diagnosis not present

## 2022-12-01 DIAGNOSIS — R627 Adult failure to thrive: Secondary | ICD-10-CM | POA: Diagnosis present

## 2022-12-01 DIAGNOSIS — I2510 Atherosclerotic heart disease of native coronary artery without angina pectoris: Secondary | ICD-10-CM | POA: Diagnosis present

## 2022-12-01 DIAGNOSIS — A419 Sepsis, unspecified organism: Secondary | ICD-10-CM | POA: Diagnosis present

## 2022-12-01 DIAGNOSIS — R9431 Abnormal electrocardiogram [ECG] [EKG]: Secondary | ICD-10-CM | POA: Diagnosis not present

## 2022-12-01 DIAGNOSIS — R6520 Severe sepsis without septic shock: Secondary | ICD-10-CM | POA: Diagnosis present

## 2022-12-01 DIAGNOSIS — T17590A Other foreign object in bronchus causing asphyxiation, initial encounter: Secondary | ICD-10-CM | POA: Diagnosis not present

## 2022-12-01 DIAGNOSIS — I4892 Unspecified atrial flutter: Secondary | ICD-10-CM | POA: Diagnosis not present

## 2022-12-01 DIAGNOSIS — G1221 Amyotrophic lateral sclerosis: Secondary | ICD-10-CM | POA: Diagnosis present

## 2022-12-01 DIAGNOSIS — T17908A Unspecified foreign body in respiratory tract, part unspecified causing other injury, initial encounter: Secondary | ICD-10-CM

## 2022-12-01 DIAGNOSIS — Z66 Do not resuscitate: Secondary | ICD-10-CM | POA: Diagnosis present

## 2022-12-01 DIAGNOSIS — R4701 Aphasia: Secondary | ICD-10-CM | POA: Diagnosis present

## 2022-12-01 DIAGNOSIS — J9602 Acute respiratory failure with hypercapnia: Secondary | ICD-10-CM | POA: Diagnosis present

## 2022-12-01 DIAGNOSIS — I2129 ST elevation (STEMI) myocardial infarction involving other sites: Secondary | ICD-10-CM | POA: Diagnosis not present

## 2022-12-01 DIAGNOSIS — I4891 Unspecified atrial fibrillation: Secondary | ICD-10-CM | POA: Diagnosis not present

## 2022-12-01 DIAGNOSIS — Z931 Gastrostomy status: Secondary | ICD-10-CM

## 2022-12-01 DIAGNOSIS — Z515 Encounter for palliative care: Secondary | ICD-10-CM | POA: Diagnosis not present

## 2022-12-01 DIAGNOSIS — Z6820 Body mass index (BMI) 20.0-20.9, adult: Secondary | ICD-10-CM

## 2022-12-01 DIAGNOSIS — J9601 Acute respiratory failure with hypoxia: Secondary | ICD-10-CM | POA: Diagnosis present

## 2022-12-01 DIAGNOSIS — R1310 Dysphagia, unspecified: Secondary | ICD-10-CM | POA: Diagnosis present

## 2022-12-01 DIAGNOSIS — E785 Hyperlipidemia, unspecified: Secondary | ICD-10-CM | POA: Diagnosis present

## 2022-12-01 DIAGNOSIS — R0602 Shortness of breath: Principal | ICD-10-CM

## 2022-12-01 DIAGNOSIS — Z7189 Other specified counseling: Secondary | ICD-10-CM | POA: Diagnosis not present

## 2022-12-01 LAB — CBC
HCT: 40.2 % (ref 39.0–52.0)
Hemoglobin: 13 g/dL (ref 13.0–17.0)
MCH: 29.8 pg (ref 26.0–34.0)
MCHC: 32.3 g/dL (ref 30.0–36.0)
MCV: 92.2 fL (ref 80.0–100.0)
Platelets: 170 10*3/uL (ref 150–400)
RBC: 4.36 MIL/uL (ref 4.22–5.81)
RDW: 13.4 % (ref 11.5–15.5)
WBC: 12.3 10*3/uL — ABNORMAL HIGH (ref 4.0–10.5)
nRBC: 0 % (ref 0.0–0.2)

## 2022-12-01 LAB — HEPATIC FUNCTION PANEL
ALT: 26 U/L (ref 0–44)
AST: 30 U/L (ref 15–41)
Albumin: 3.6 g/dL (ref 3.5–5.0)
Alkaline Phosphatase: 48 U/L (ref 38–126)
Bilirubin, Direct: <0.1 mg/dL (ref 0.0–0.2)
Total Bilirubin: 0.7 mg/dL (ref 0.3–1.2)
Total Protein: 6.6 g/dL (ref 6.5–8.1)

## 2022-12-01 LAB — BASIC METABOLIC PANEL
Anion gap: 12 (ref 5–15)
BUN: 24 mg/dL — ABNORMAL HIGH (ref 8–23)
CO2: 30 mmol/L (ref 22–32)
Calcium: 9.1 mg/dL (ref 8.9–10.3)
Chloride: 97 mmol/L — ABNORMAL LOW (ref 98–111)
Creatinine, Ser: 0.49 mg/dL — ABNORMAL LOW (ref 0.61–1.24)
GFR, Estimated: >60 mL/min (ref >60)
Glucose, Bld: 111 mg/dL — ABNORMAL HIGH (ref 70–99)
Potassium: 4 mmol/L (ref 3.5–5.1)
Sodium: 139 mmol/L (ref 135–145)

## 2022-12-01 LAB — LACTIC ACID, PLASMA: Lactic Acid, Venous: 0.8 mmol/L (ref 0.5–1.9)

## 2022-12-01 LAB — MAGNESIUM: Magnesium: 2.2 mg/dL (ref 1.7–2.4)

## 2022-12-01 LAB — PROCALCITONIN: Procalcitonin: <0.1 ng/mL

## 2022-12-01 LAB — GLUCOSE, CAPILLARY: Glucose-Capillary: 109 mg/dL — ABNORMAL HIGH (ref 70–99)

## 2022-12-01 LAB — TROPONIN I (HIGH SENSITIVITY): Troponin I (High Sensitivity): 20 ng/L — ABNORMAL HIGH (ref <18)

## 2022-12-01 LAB — PHOSPHORUS: Phosphorus: 4 mg/dL (ref 2.5–4.6)

## 2022-12-01 LAB — SARS CORONAVIRUS 2 BY RT PCR: SARS Coronavirus 2 by RT PCR: NEGATIVE

## 2022-12-01 LAB — BRAIN NATRIURETIC PEPTIDE: B Natriuretic Peptide: 38.8 pg/mL (ref 0.0–100.0)

## 2022-12-01 MED ORDER — PANTOPRAZOLE SODIUM 40 MG IV SOLR
40.0000 mg | Freq: Two times a day (BID) | INTRAVENOUS | Status: DC
  Administered 2022-12-01 – 2022-12-04 (×6): 40 mg via INTRAVENOUS
  Filled 2022-12-01 (×6): qty 10

## 2022-12-01 MED ORDER — HEPARIN SODIUM (PORCINE) 5000 UNIT/ML IJ SOLN
5000.0000 [IU] | Freq: Three times a day (TID) | INTRAMUSCULAR | Status: DC
  Administered 2022-12-01 – 2022-12-03 (×7): 5000 [IU] via SUBCUTANEOUS
  Filled 2022-12-01 (×7): qty 1

## 2022-12-01 MED ORDER — GUAIFENESIN ER 600 MG PO TB12: 600.0000 mg | ORAL_TABLET | Freq: Two times a day (BID) | ORAL | Status: DC | PRN

## 2022-12-01 MED ORDER — SODIUM CHLORIDE 0.9 % IV SOLN
1.0000 g | Freq: Once | INTRAVENOUS | Status: AC
  Administered 2022-12-01: 1 g via INTRAVENOUS
  Filled 2022-12-01: qty 10

## 2022-12-01 MED ORDER — ACETAMINOPHEN 650 MG RE SUPP: 650.0000 mg | Freq: Four times a day (QID) | RECTAL | Status: DC | PRN

## 2022-12-01 MED ORDER — ORAL CARE MOUTH RINSE
15.0000 mL | OROMUCOSAL | Status: DC | PRN
  Administered 2022-12-02 (×3): 15 mL via OROMUCOSAL

## 2022-12-01 MED ORDER — RILUZOLE 50 MG PO TABS
50.0000 mg | ORAL_TABLET | Freq: Two times a day (BID) | ORAL | Status: DC
  Administered 2022-12-02 – 2022-12-03 (×5): 50 mg
  Filled 2022-12-01 (×5): qty 1

## 2022-12-01 MED ORDER — GLYCOPYRROLATE 1 MG PO TABS
2.0000 mg | ORAL_TABLET | Freq: Three times a day (TID) | ORAL | Status: DC
  Administered 2022-12-02: 2 mg via ORAL
  Filled 2022-12-01: qty 2

## 2022-12-01 MED ORDER — ALBUTEROL SULFATE (2.5 MG/3ML) 0.083% IN NEBU: 2.5000 mg | INHALATION_SOLUTION | RESPIRATORY_TRACT | Status: DC | PRN

## 2022-12-01 MED ORDER — SODIUM CHLORIDE 0.9 % IV SOLN
500.0000 mg | INTRAVENOUS | Status: DC
  Filled 2022-12-01: qty 5

## 2022-12-01 MED ORDER — IOHEXOL 350 MG/ML SOLN
75.0000 mL | Freq: Once | INTRAVENOUS | Status: AC | PRN
  Administered 2022-12-01: 75 mL via INTRAVENOUS

## 2022-12-01 MED ORDER — HYOSCYAMINE SULFATE 0.125 MG SL SUBL
0.1250 mg | SUBLINGUAL_TABLET | SUBLINGUAL | Status: DC | PRN
  Administered 2022-12-02: 0.125 mg via SUBLINGUAL
  Filled 2022-12-01 (×3): qty 1

## 2022-12-01 MED ORDER — SODIUM CHLORIDE 3 % IN NEBU
4.0000 mL | INHALATION_SOLUTION | Freq: Once | RESPIRATORY_TRACT | Status: AC
  Administered 2022-12-01: 4 mL via RESPIRATORY_TRACT
  Filled 2022-12-01: qty 4

## 2022-12-01 MED ORDER — ACETAMINOPHEN 325 MG PO TABS: 650.0000 mg | ORAL_TABLET | Freq: Four times a day (QID) | ORAL | Status: DC | PRN

## 2022-12-01 MED ORDER — CHLORHEXIDINE GLUCONATE CLOTH 2 % EX PADS
6.0000 | MEDICATED_PAD | Freq: Every day | CUTANEOUS | Status: DC
  Administered 2022-12-01 – 2022-12-04 (×4): 6 via TOPICAL
  Filled 2022-12-01: qty 6

## 2022-12-01 MED ORDER — IPRATROPIUM-ALBUTEROL 0.5-2.5 (3) MG/3ML IN SOLN
3.0000 mL | Freq: Once | RESPIRATORY_TRACT | Status: AC
  Administered 2022-12-01: 3 mL via RESPIRATORY_TRACT
  Filled 2022-12-01: qty 3

## 2022-12-01 MED ORDER — SODIUM CHLORIDE 0.9 % IV SOLN
2.0000 g | INTRAVENOUS | Status: DC
  Filled 2022-12-01: qty 20

## 2022-12-01 MED ORDER — LACTATED RINGERS IV SOLN: INTRAVENOUS | Status: AC

## 2022-12-01 MED ORDER — RILUZOLE 50 MG PO TABS: 50.0000 mg | ORAL_TABLET | Freq: Two times a day (BID) | ORAL | Status: DC

## 2022-12-01 NOTE — ED Triage Notes (Addendum)
Pt to ED via [facility] from home with complaints of SOB. Pt has hx of ALS and requires constant suctioning. Per EMS pt's wife states that their suctioning went out yesterday and wife is concerned that he aspirated. Pt denies pain at this time. Per EMS, pt's SPO2 87% on RA. EMS put pt on 2L [place] and sats came up to 94%.   EMS  CBG 199

## 2022-12-01 NOTE — ED Notes (Signed)
EDP at bedside. Pt sats 87-91% on 3L. RT called for deep suctioning

## 2022-12-01 NOTE — Assessment & Plan Note (Signed)
Pt meets severe sepsis criteria with new need for oxygen.  Cont LR at 50 cc x 1 day. Cont rocephin/ azithromycin.

## 2022-12-01 NOTE — Assessment & Plan Note (Signed)
No  meds on board. Will monitor strict I/O. Bladder scan if needed.

## 2022-12-01 NOTE — ED Notes (Addendum)
MD to bedside. [name] to bedside. Per wife pt was lowered to the ground due to weakness today while going to the bathroom by his home health aid.

## 2022-12-01 NOTE — ED Notes (Signed)
Attempted to call report to ICU. Nurse unavailable at this time.

## 2022-12-01 NOTE — ED Notes (Signed)
RT at bedside. Will put pt on HFNC and deep suction.

## 2022-12-01 NOTE — Assessment & Plan Note (Signed)
Aspiration precaution.  CPT per RT. NPO.  IV PPI.

## 2022-12-01 NOTE — Assessment & Plan Note (Signed)
2/2 to aspiration and obstruction of left main bronchus with layering  debris.  Cont with HFNC/ NIPPV as needed low threshold for ICU d/w ICU APP in event pt declines as his prognosis is guarded.  Continuous pulse oximetry. CPT q2 hours. Bronchodilator and nebulizer with hypertonic saline per RT.

## 2022-12-01 NOTE — ED Provider Notes (Signed)
[facility] Provider Note    Event Date/Time   First MD Initiated Contact with Patient 12/03/2022 1502     (approximate)   History   Near syncope   HPI  Donald Gould is a 72 y.o. male history of ALS who presents to the emergency department today because of concerns for a near syncopal episode.  History is obtained by family at bedside.  They states that the patient was working with his aide.  Apparently was attempting to get to the bathroom when all of a sudden he became weak.  Fortunately the aide was able to help him to the floor so he did not hit his head.  Family states that he does have a history of falls and that his legs have gone out from under him in the past.  However they are also concern for possible pneumonia given that there is suction machine was not working for the past day.  They have not appreciated any fevers at home.     Physical Exam   Triage Vital Signs: ED Triage Vitals  Encounter Vitals Group     BP 12/15/2022 1457 (!) 142/80     Systolic BP Percentile --      Diastolic BP Percentile --      Pulse Rate 11/19/2022 1455 92     Resp 11/29/2022 1455 (!) 26     Temp --      Temp src --      SpO2 --      Weight 11/29/2022 1456 166 lb 0.1 oz (75.3 kg)     Height 11/19/2022 1456 6\' 3"  (1.905 m)     Head Circumference --      Peak Flow --      Pain Score 12/15/2022 1453 0     Pain Loc --      Pain Education --      Exclude from Growth Chart --     Most recent vital signs: Vitals:   12/03/2022 1455 12/13/2022 1457  BP:  (!) 142/80  Pulse: 92   Resp: (!) 26    General: Awake, alert. CV:  Good peripheral perfusion. Regular rate and rhythm. Resp:  Increased work of breathing, diffuse rhonchi Abd:  No distention. Non tender.   ED Results / Procedures / Treatments   Labs (all labs ordered are listed, but only abnormal results are displayed) Labs Reviewed  BASIC METABOLIC PANEL - Abnormal; Notable for the following components:       Result Value   Chloride 97 (*)    Glucose, Bld 111 (*)    BUN 24 (*)    Creatinine, Ser 0.49 (*)    All other components within normal limits  CBC - Abnormal; Notable for the following components:   WBC 12.3 (*)    All other components within normal limits  TROPONIN I (HIGH SENSITIVITY) - Abnormal; Notable for the following components:   Troponin I (High Sensitivity) 20 (*)    All other components within normal limits  SARS CORONAVIRUS 2 BY RT PCR  CULTURE, BLOOD (ROUTINE X 2)  CULTURE, BLOOD (ROUTINE X 2)  BRAIN NATRIURETIC PEPTIDE  LACTIC ACID, PLASMA  LACTIC ACID, PLASMA     EKG  I, Phineas Semen, attending physician, personally viewed and interpreted this EKG  EKG Time: 1519 Rate: 85 Rhythm: sinus rhythm Axis: normal Intervals: qtc 438 QRS: narrow, q waves v1 ST changes: no st elevation Impression: abnormal ekg   RADIOLOGY I independently interpreted and  visualized the CXR. My interpretation: Edema vs multifocal infection Radiology interpretation:  IMPRESSION:  Mild peripheral increased septal markings suggest mild interstitial  edema. No airspace opacities or atelectasis identified.   I independently interpreted and visualized the CT angio. My interpretation: debris in left bronchus Radiology interpretation:  IMPRESSION:  Aspirated debris within the trachea and occluding the left mainstem  bronchus and multiple left lingular and lower lobe bronchi.    Aortic Atherosclerosis (ICD10-I70.0).     PROCEDURES:  Critical Care performed: Yes  CRITICAL CARE Performed by: Phineas Semen   Total critical care time: 35 minutes  Critical care time was exclusive of separately billable procedures and treating other patients.  Critical care was necessary to treat or prevent imminent or life-threatening deterioration.  Critical care was time spent personally by me on the following activities: development of treatment plan with patient and/or surrogate as well  as nursing, discussions with consultants, evaluation of patient's response to treatment, examination of patient, obtaining history from patient or surrogate, ordering and performing treatments and interventions, ordering and review of laboratory studies, ordering and review of radiographic studies, pulse oximetry and re-evaluation of patient's condition.   Procedures    MEDICATIONS ORDERED IN ED: Medications - No data to display   IMPRESSION / MDM / ASSESSMENT AND PLAN / ED COURSE  I reviewed the triage vital signs and the nursing notes.                              Differential diagnosis includes, but is not limited to, anemia, dehydration, ACS, infection  Patient's presentation is most consistent with acute presentation with potential threat to life or bodily function.   The patient is on the cardiac monitor to evaluate for evidence of arrhythmia and/or significant heart rate changes.  Patient presented to the emergency department today after a near syncopal episode at home.  On initial vital signs here patient was found to be quite hypoxic.  Family states he is not on oxygen at home.  Chest x-ray with diffuse rhonchi.  Patient was given suctioning and was placed on supplemental oxygen here in the emergency department.  Will initiate broad workup to evaluate for potential cause of hypoxia as well as near syncope..  Chest x-ray without obvious pneumonia.  Blood work showed a slight leukocytosis so IV antibiotics were started.  I did obtain a CT angio to get a better sense of what was going on the patient's lung and to evaluate for pulmonary embolism.  While this did not show PE did show a large amount of aspirated debris in the airways including occlusion of the left mainstem bronchus.  Discussed with Dr. Aundria Rud with pulmonary.  At this time did not feel patient required emergent bronchoscopy however will plan on bronchoscopy tomorrow morning.  He recommended pulmonary toilet and  bronchodilators. Discussed with Dr. Allena Katz with the hospitalist service who will evaluate for admission.      FINAL CLINICAL IMPRESSION(S) / ED DIAGNOSES   Final diagnoses:  SOB (shortness of breath)  Aspiration into airway, initial encounter     Note:  This document was prepared using Dragon voice recognition software and may include unintentional dictation errors.    Phineas Semen, MD 11/22/2022 [phone]

## 2022-12-01 NOTE — ED Notes (Addendum)
RT at bedside to place pt on heated HFNC.

## 2022-12-01 NOTE — ED Notes (Signed)
Labs recollected and sent to lab.

## 2022-12-01 NOTE — ED Notes (Signed)
X-ray at bedside

## 2022-12-01 NOTE — ED Notes (Signed)
This RN took three bottles of the prescribed meds down to the pharmacy.

## 2022-12-01 NOTE — Assessment & Plan Note (Signed)
PT eval once Pneumonia is stable and pt is able to participate.  At baseline pt wallks with walker.

## 2022-12-01 NOTE — ED Notes (Signed)
Lab called regarding the lab work not in process. Lab stated there is no blood down there under his name. RN will recollect.

## 2022-12-01 NOTE — Assessment & Plan Note (Addendum)
BMI of 20.75. Nutritionist consult or pharmacy consult for options for improving his intake whether is to change tube feed formula etc.  Wife states they use formula that is lactose free for tube feeds_[employer] nutritional supplement as something he takes TID at breakfast-lunch-dinner  D/W her about holding tube feeds tonight.  We will start LR and hydrate overnight. POCT q4h x 3 occurrence to monitor for any hypoglycemia.

## 2022-12-01 NOTE — ED Notes (Signed)
This RN took three bottles of pt own prescription drugs to the pharmacy.

## 2022-12-01 NOTE — Consult Note (Signed)
NAME:  Donald Gould, MRN:  [id number], DOB:  07/18/1950, LOS: 0 ADMISSION DATE:  12-21-22, CONSULTATION DATE:  Dec 21, 2022 REFERRING MD:  Earnie Larsson  CHIEF COMPLAINT:  Acute Respiratory Failure    HPI  72 y.o male with significant PMH of ALS (amyotrophic lateral sclerosis), thrombocytopenia, Chronic Anemia, unintentional  weight loss, dysphagia  w/recurrent aspiration and Aspiration pneumonia s/p PEG placement, BPH, HLD, who presented to the ED with chief complaints of near syncopal episode and possible aspiration.  Per patient's wife at the bedside, pt has ALS and dysphagia at baseline and requires constant suctioning.  Patient's home suctioning unit malfunctioned yesterday and he has been unable to control oral secretions.  Patient apparently was standing with the help of his personal aide when he suddenly became short of breath with near syncopal episode.  He was assisted to the floor without hitting his head and EMS called.  On EMS arrival patient was found to be hypoxic with sats in the 80s, patient's wife thinks he may have aspirated.  He was placed on nasal cannula and transported to the ED   ED Course: Initial vital signs showed HR of 92 beats/minute, BP 142/80 mm Hg, the RR 31 breaths/minute, and the oxygen saturation 92% on [employer] and a temperature of 98.22F (36.7C). Pertinent Labs/Diagnostics Findings: Na+/ K+:139/4.0  Glucose: 111 BUN/Cr.:24/0.49 WBC:12.3  PCT: negative <0.10  Lactic acid: 0.8 COVID PCR: Negative,  troponin:20  CTA Chest> Aspirated debris within the trachea and occluding the left mainstem bronchus and multiple left lingular and lower lobe bronchi.  Patient was treated in the ED with DuoNebs and hypertonic saline nebulizers.  Was started on broad-spectrum antibiotic for aspiration pneumonia and admitted to hospitalist service.  PCCM consulted to evaluate for possible bronchoscopy and high risk for intubation   Past Medical History  ALS (amyotrophic lateral  sclerosis), thrombocytopenia, Chronic Anemia, unintentional  weight loss, dysphagia  w/recurrent aspiration and Aspiration pneumonia s/p PEG placement, BPH, HLD,  Significant Hospital Events   2022/12/21: Admitted to ICU with acute hypoxic respiratory failure in the setting of aspiration pneumonia and progressive Neuromuscular lung disease   Consults:  PCCM  Procedures:  None  Significant Diagnostic Tests:  21-Dec-2022: Chest Xray> IMPRESSION: Mild peripheral increased septal markings suggest mild interstitial edema. No airspace opacities or atelectasis identified.  December 21, 2022: CTA Chest > IMPRESSION: Aspirated debris within the trachea and occluding the left mainstem bronchus and multiple left lingular and lower lobe bronchi.  Interim History / Subjective:    -  Micro Data:  12-21-2022: SARS-CoV-2 PCR> negative Dec 21, 2022: Influenza PCR> negative Dec 21, 2022: Blood culture x2> 9/18: MRSA PCR>>   Antimicrobials:  Azithromycin 12/21/22 x 1 Ceftriaxone December 21, 2022 x 1 Unasyn 9/16>  OBJECTIVE  Blood pressure (!) 149/84, pulse 87, temperature 99 F (37.2 C), temperature source Axillary, resp. rate (!) 39, height 6\' 3"  (1.905 m), weight 75.3 kg, SpO2 93%.        Intake/Output Summary (Last 24 hours) at 21-Dec-2022 2349 Last data filed at 2022/12/21 2323 Gross per 24 hour  Intake 200 ml  Output --  Net 200 ml   Filed Weights   Dec 21, 2022 1456  Weight: 75.3 kg     Physical Examination  GENERAL:72  year-old critically ill patient lying in the bed in mild respiratory distress EYES: PEERLA. No scleral icterus. Extraocular muscles intact.  HEENT: Head atraumatic, normocephalic. Oropharynx and nasopharynx clear.  NECK:  No JVD, supple  LUNGS: Decreased breath sounds with diffuse rhonchi bilaterally.  mild use of accessory  muscles of respiration.  CARDIOVASCULAR: S1, S2 normal. No murmurs, rubs, or gallops.  ABDOMEN: Soft, NTND EXTREMITIES: No swelling or erythema.  Capillary refill < 3 seconds in all  extremities. Pulses palpable distally. NEUROLOGIC: The patient is lethargic. No neurological deficit appreciated. Cranial nerves are intact.  SKIN: No obvious rash, lesion, or ulcer. Warm to touch Labs/imaging that I havepersonally reviewed  (right click and "Reselect all SmartList Selections" daily)     Labs   CBC: Recent Labs  Lab 12-07-22 1534  WBC 12.3*  HGB 13.0  HCT 40.2  MCV 92.2  PLT 170    Basic Metabolic Panel: Recent Labs  Lab Dec 07, 2022 1534  NA 139  K 4.0  CL 97*  CO2 30  GLUCOSE 111*  BUN 24*  CREATININE 0.49*  CALCIUM 9.1  MG 2.2  PHOS 4.0   GFR: Estimated Creatinine Clearance: 86.3 mL/min (A) (by C-G formula based on SCr of 0.49 mg/dL (L)). Recent Labs  Lab 12-07-22 1534 07-Dec-2022 1849  PROCALCITON <0.10  --   WBC 12.3*  --   LATICACIDVEN  --  0.8    Liver Function Tests: Recent Labs  Lab December 07, 2022 1534  AST 30  ALT 26  ALKPHOS 48  BILITOT 0.7  PROT 6.6  ALBUMIN 3.6   No results for input(s): "LIPASE", "AMYLASE" in the last 168 hours. No results for input(s): "AMMONIA" in the last 168 hours.  ABG No results found for: "PHART", "PCO2ART", "PO2ART", "HCO3", "TCO2", "ACIDBASEDEF", "O2SAT"   Coagulation Profile: No results for input(s): "INR", "PROTIME" in the last 168 hours.  Cardiac Enzymes: No results for input(s): "CKTOTAL", "CKMB", "CKMBINDEX", "TROPONINI" in the last 168 hours.  HbA1C: No results found for: "HGBA1C"  CBG: Recent Labs  Lab 12/07/2022 2305  GLUCAP 109*    Review of Systems:   Unable to be obtained secondary to the patient on BiPAP.    Past Medical History  He,  has a past medical history of ALS (amyotrophic lateral sclerosis) (HCC), Anemia, unspecified (09/14/2022), and Environmental allergies.   Surgical History    Past Surgical History:  Procedure Laterality Date   CATARACT EXTRACTION W/PHACO Right 07/02/2021   Procedure: CATARACT EXTRACTION PHACO AND INTRAOCULAR LENS PLACEMENT (IOC) RIGHT;  Surgeon:  Lockie Mola, MD;  Location: [facility];  Service: Ophthalmology;  Laterality: Right;  11.84 1:15.6   RETINAL DETACHMENT SURGERY Left 01/10/2021   [facility]     Social History   reports that he quit smoking about 34 years ago. His smoking use included cigarettes. He has never used smokeless tobacco. He reports current alcohol use of about 11.0 standard drinks of alcohol per week.   Family History   His family history is not on file.   Allergies Allergies  Allergen Reactions   Other Other (See Comments)    Hip pain  Muscle and joint issues.  Joint pain    Hip pain  Muscle and joint issues.   Statins Rash    Joint pain     Home Medications  Prior to Admission medications   Medication Sig Start Date End Date Taking? Authorizing Provider  NUEDEXTA 20-10 MG capsule Take 1 capsule by mouth 2 (two) times daily. 06/25/22  Yes [provider]  cetirizine HCl (EQ ALLERG RELIEF CHILD, CETIR,) 5 MG/5ML SOLN Place 10 mLs (10 mg total) into feeding tube daily. 05/16/22   Almon Hercules, MD  ciprofloxacin (CILOXAN) 0.3 % ophthalmic solution Place 2 drops into both eyes 2 (two) times daily. 11/27/22  [provider]  ezetimibe (ZETIA) 10 MG tablet Take 10 mg by mouth daily. 09/07/22   [provider]  fluticasone (FLONASE) 50 MCG/ACT nasal spray Place 2 sprays into both nostrils daily. 11/19/22   [provider]  glycopyrrolate (ROBINUL) 1 MG tablet Take 2 mg by mouth 3 (three) times daily.    [provider]  hyoscyamine (LEVSIN) 0.125 MG tablet Take 0.125 mg by mouth every 4 (four) hours as needed (secretions).    [provider]  LORazepam (ATIVAN) 0.5 MG tablet PLEASE SEE ATTACHED FOR DETAILED DIRECTIONS 11/08/22   [provider]  riluzole (RILUTEK) 50 MG tablet Place 1 tablet (50 mg total) into feeding tube every 12 (twelve) hours. 05/16/22   Almon Hercules, MD  Water For Irrigation, Sterile (FREE WATER) SOLN Place 150 mLs  into feeding tube 5 (five) times daily. 05/16/22   Almon Hercules, MD    Scheduled Meds:  Chlorhexidine Gluconate Cloth  6 each Topical Daily   glycopyrrolate  2 mg Per Tube TID   heparin  5,000 Units Subcutaneous Q8H   pantoprazole (PROTONIX) IV  40 mg Intravenous Q12H   riluzole  50 mg Per Tube Q12H   Continuous Infusions:  ampicillin-sulbactam (UNASYN) IV Stopped (12/02/22 0113)   lactated ringers 50 mL/hr at 12/02/22 0300   PRN Meds:.acetaminophen **OR** acetaminophen, albuterol, guaiFENesin, hyoscyamine, morphine injection, mouth rinse  Active Hospital Problem list   See systems below  Assessment & Plan:   #Acute Hypoxic Respiratory Failure  #Aspiration Pneumonia  Hx of recurrent Aspiration due to 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 -Supplemental O2 as needed to maintain O2 saturations 88 to 92% -HFNC/BiPAP, wean as tolerated -High risk for intubation -Follow intermittent Chest X-ray & ABG as needed  -PRN and Scheduled Bronchodilators  -Antibiotics as below -Plan for possible bedside Bronchoscopy in the am   #Sepsis due to Aspiration Pneumonia Initial interventions/workup included: 2 L of NS/LR & Ceftriaxone/ Azithromycin -F/u cultures, trend lactic/ PCT -Monitor WBC/ fever curve -IV antibiotics Ceftriaxone AND Azithromycin switch to Unasyn for Aspiration coverage -Gentle IVF hydration as needed -Pressors PRN for MAP goal >65 -Strict I/O's  #ALS Progressive with complicating features requiring supplemental oxygen/NIV, uses Trilogy at home -Follows with [facility] neurology on Rilutek. At last visit patient was referred to hospice care -Palliative consult for goals of care  #Failure to thrive #Dysphagia #PBA -Continue home Hyoscyamine, glycopyrrolate.   Unable to tolerate Nuedexta due to diarrhea started on Amytriptyline by neurology -Patient has PEG in place for nutritional needs.   -Will consult dietary for assistant with tube feeds  #Anxiety -started on Ativan 0.5mg  BID PRN  Best practice:  Diet:  Tube Feed  Pain/Anxiety/Delirium protocol (if indicated): No VAP protocol (if indicated): Not indicated DVT prophylaxis: Subcutaneous Heparin GI prophylaxis: PPI Glucose control:  SSI Yes Central venous access:  N/A Arterial line:  N/A Foley:  N/A Mobility:  bed rest  PT consulted: N/A Last date of multidisciplinary goals of care discussion [9/16] Code Status:  full code Disposition: ICU   = Goals of Care = Code Status Order: @CODE @   Primary Emergency Contact: Guadron,sandy Wishes to pursue full aggressive treatment and intervention options, including CPR and intubation, but goals of care will be addressed on going with family if that should become necessary.  Critical care time: 45 minutes        Webb Silversmith DNP, CCRN, FNP-C, AGACNP-BC Acute Care & Family Nurse Practitioner [facility] PCCM on call pager [phone]

## 2022-12-01 NOTE — H&P (Addendum)
History and Physical    Patient: Donald Gould WUJ:[id number] DOB: 03-Jan-1951 DOA: 12/03/2022 DOS: the patient was seen and examined on 12/02/2022 PCP: Kandyce Rud, MD  Patient coming from: Home   Chief Complaint:  Chief Complaint  Patient presents with   Shortness of Breath    HPI: Donald Gould is a 72 y.o. male  with past medical history of ALS, aphasia, dysphagia, history of aspiration pneumonia, history of heart disease, history of BPH, protein calorie malnutrition severe, small bowel obstruction and earlier part of this year, presenting with concerns today of shortness of breath and suspicion of aspiration.  Per reportwife states that their suctioning went out yesterday due to his history of ALS patient requires frequent deep suctioning.Today he was with home health and was so weak they had to lay him on the ground.  Initial vitals by EMS show hypoxia with 87% on room air and patient was started on 2 L nasal cannula with O2 sats improving to 94% and initial blood sugar of 199.  In the hospital patient was started on high flow nasal cannula.  O2 sats improving to 96% on 15 L high flow nasal cannula.  Chart review has patient with a history of G-tube will verify and confirm mode and route of nutrition.  Initial vitals showed blood pressure 142/80 respirations of 26 heart rate of 92 patient's weight at 166 pounds.  Initial EKG showed sinus rhythm 85 no ST elevations, QTc of 438, Q waves in lead V1. Blood work today showed glucose of 111 creatinine of 0.49 initial troponin of 20, BNP of 38.8, white count of 12.3 with normal hemoglobin and platelets.  Respiratory panel negative for COVID.  I will order procalcitonin  and lactic acid is pending  as patient meets sepsis criteria with respiratory failure infection tachycardia and white count.I will order lft to monitor any transaminitis.  Blood cultures are collected in the emergency room.  Initial imaging with chest x-ray done initially  for patient's hypoxia and respiratory failure showed increased septal markings suggestive of mild interstitial edema followed up CT angio PE protocol showed Aspirated debris within the trachea and occluding the left mainstem  bronchus and multiple left lingular and lower lobe bronchi. In the emergency room patient was given DuoNeb and hypertonic saline nebulizer treatments, and Rocephin 1 g.   Currently SpO2: 95 % O2 Flow Rate (L/min): 15 L/min Pulmonary consulted and they will see him and will place order Dr.Dgayli.   Review of Systems: Review of Systems  Unable to perform ROS: Acuity of condition (ROS/HPI limited due to acuity)  Respiratory:  Positive for cough, shortness of breath and wheezing.   Neurological:  Positive for weakness.    Past Medical History:  Diagnosis Date   ALS (amyotrophic lateral sclerosis) (HCC)    Anemia, unspecified 09/14/2022   Environmental allergies    Past Surgical History:  Procedure Laterality Date   CATARACT EXTRACTION W/PHACO Right 07/02/2021   Procedure: CATARACT EXTRACTION PHACO AND INTRAOCULAR LENS PLACEMENT (IOC) RIGHT;  Surgeon: Lockie Mola, MD;  Location: [facility];  Service: Ophthalmology;  Laterality: Right;  11.84 1:15.6   RETINAL DETACHMENT SURGERY Left 01/10/2021   [facility]   Social History:   reports that he quit smoking about 34 years ago. His smoking use included cigarettes. He has never used smokeless tobacco. He reports current alcohol use of about 11.0 standard drinks of alcohol per week. No history on file for drug use.  Allergies  Allergen Reactions   Other Other (  See Comments)    Hip pain  Muscle and joint issues.  Joint pain    Hip pain  Muscle and joint issues.   Statins Rash    Joint pain    History reviewed. No pertinent family history.  Prior to Admission medications   Medication Sig Start Date End Date Taking? Authorizing Provider  cetirizine HCl (EQ ALLERG RELIEF CHILD, CETIR,) 5 MG/5ML SOLN  Place 10 mLs (10 mg total) into feeding tube daily. 05/16/22   Almon Hercules, MD  polyethylene glycol powder (MIRALAX) 17 GM/SCOOP powder Place 17 g into feeding tube 2 (two) times daily as needed for moderate constipation or mild constipation. 05/16/22   Almon Hercules, MD  riluzole (RILUTEK) 50 MG tablet Place 1 tablet (50 mg total) into feeding tube every 12 (twelve) hours. 05/16/22   Almon Hercules, MD  sennosides (SENOKOT) 8.8 MG/5ML syrup Place 5 mLs into feeding tube 2 (two) times daily as needed for moderate constipation. 05/16/22   Almon Hercules, MD  Water For Irrigation, Sterile (FREE WATER) SOLN Place 150 mLs into feeding tube 5 (five) times daily. 05/16/22   Almon Hercules, MD     Vitals:   Dec 09, 2022 1800 2022/12/09 1830 2022/12/09 2000 12-09-2022 2014  BP: (!) 147/81 (!) 148/79 (!) 144/76   Pulse: 85 81 83   Resp: (!) 23 (!) 26 (!) 28   Temp:    98 F (36.7 C)  TempSrc:    Oral  SpO2: 97% 96% 95%   Weight:      Height:       Physical Exam Vitals and nursing note reviewed.  Constitutional:      General: He is in acute distress.     Appearance: He is ill-appearing and toxic-appearing.  HENT:     Head: Normocephalic and atraumatic.     Right Ear: Hearing normal.     Left Ear: Hearing normal.     Nose: No nasal deformity.     Mouth/Throat:     Lips: Pink.     Tongue: No lesions.  Eyes:     General: Lids are normal.     Extraocular Movements: Extraocular movements intact.  Cardiovascular:     Rate and Rhythm: Regular rhythm. Tachycardia present.     Heart sounds: Normal heart sounds.  Pulmonary:     Effort: Tachypnea, accessory muscle usage and respiratory distress present.     Breath sounds: Examination of the right-upper field reveals wheezing, rhonchi and rales. Examination of the left-upper field reveals wheezing, rhonchi and rales. Examination of the right-middle field reveals wheezing, rhonchi and rales. Examination of the left-middle field reveals wheezing, rhonchi and rales.  Examination of the right-lower field reveals wheezing, rhonchi and rales. Examination of the left-lower field reveals wheezing, rhonchi and rales. Wheezing, rhonchi and rales present.  Abdominal:     General: Bowel sounds are normal. There is no distension.     Palpations: Abdomen is soft. There is no mass.     Tenderness: There is no abdominal tenderness.  Musculoskeletal:     Right lower leg: No edema.     Left lower leg: No edema.  Skin:    General: Skin is warm.  Neurological:     Mental Status: He is alert and oriented to person, place, and time.     Cranial Nerves: Cranial nerves 2-12 are intact.  Psychiatric:        Attention and Perception: Attention normal.  Mood and Affect: Mood normal.        Speech: Speech normal.        Behavior: Behavior normal. Behavior is cooperative.     Labs on Admission: I have personally reviewed following labs and imaging studies  CBC: Recent Labs  Lab December 13, 2022 1534  WBC 12.3*  HGB 13.0  HCT 40.2  MCV 92.2  PLT 170   Basic Metabolic Panel: Recent Labs  Lab 12/13/2022 1534  NA 139  K 4.0  CL 97*  CO2 30  GLUCOSE 111*  BUN 24*  CREATININE 0.49*  CALCIUM 9.1   GFR: Estimated Creatinine Clearance: 86.3 mL/min (A) (by C-G formula based on SCr of 0.49 mg/dL (L)). Liver Function Tests: Recent Labs  Lab 12/13/22 1534  AST 30  ALT 26  ALKPHOS 48  BILITOT 0.7  PROT 6.6  ALBUMIN 3.6   No results for input(s): "LIPASE", "AMYLASE" in the last 168 hours. No results for input(s): "AMMONIA" in the last 168 hours. Coagulation Profile: No results for input(s): "INR", "PROTIME" in the last 168 hours. Cardiac Enzymes: No results for input(s): "CKTOTAL", "CKMB", "CKMBINDEX", "TROPONINI" in the last 168 hours. BNP (last 3 results) No results for input(s): "PROBNP" in the last 8760 hours. HbA1C: No results for input(s): "HGBA1C" in the last 72 hours. CBG: No results for input(s): "GLUCAP" in the last 168 hours. Lipid  Profile: No results for input(s): "CHOL", "HDL", "LDLCALC", "TRIG", "CHOLHDL", "LDLDIRECT" in the last 72 hours. Thyroid Function Tests: No results for input(s): "TSH", "T4TOTAL", "FREET4", "T3FREE", "THYROIDAB" in the last 72 hours. Anemia Panel: No results for input(s): "VITAMINB12", "FOLATE", "FERRITIN", "TIBC", "IRON", "RETICCTPCT" in the last 72 hours. Urinalysis No results found for: "COLORURINE", "APPEARANCEUR", "LABSPEC", "PHURINE", "GLUCOSEU", "HGBUR", "BILIRUBINUR", "KETONESUR", "PROTEINUR", "UROBILINOGEN", "NITRITE", "LEUKOCYTESUR"   Unresulted Labs (From admission, onward)     Start     Ordered   12/02/22 0500  Comprehensive metabolic panel  Tomorrow morning,   R        12/13/22 1928   12/02/22 0500  CBC  Tomorrow morning,   R        Dec 13, 2022 1928   12-13-2022 1923  Expectorated Sputum Assessment w Gram Stain, Rflx to Resp Cult  (COPD / Pneumonia / Cellulitis / Lower Extremity Wound)  Once,   R        13-Dec-2022 1928   2022/12/13 1923  Culture, blood (routine x 2) Call MD if unable to obtain prior to antibiotics being given  (COPD / Pneumonia / Cellulitis / Lower Extremity Wound)  BLOOD CULTURE X 2,   R     Comments: If blood cultures drawn in Emergency Department - Do not draw and cancel order    2022/12/13 1928   December 13, 2022 1923  CBC  (heparin)  Once,   R       Comments: Baseline for heparin therapy IF NOT ALREADY DRAWN.  Notify MD if PLT < 100 K.    12/13/2022 1928   2022-12-13 1923  Creatinine, serum  (heparin)  Once,   R       Comments: Baseline for heparin therapy IF NOT ALREADY DRAWN.    12/13/22 1928   2022-12-13 1912  Hepatic function panel  Add-on,   AD        2022-12-13 1911   12-13-2022 1912  Procalcitonin  Add-on,   AD       References:    Procalcitonin Lower Respiratory Tract Infection AND Sepsis Procalcitonin Algorithm   December 13, 2022 1911  12/08/2022 1754  Lactic acid, plasma  (Lactic Acid)  Now then every 2 hours,   STAT      11/19/2022 1753   11/24/2022 1754  Blood culture  (routine x 2)  BLOOD CULTURE X 2,   STAT      12/05/2022 1753            Medications  sodium chloride HYPERTONIC 3 % nebulizer solution 4 mL (has no administration in time range)  ipratropium-albuterol (DUONEB) 0.5-2.5 (3) MG/3ML nebulizer solution 3 mL (has no administration in time range)  cefTRIAXone (ROCEPHIN) 2 g in sodium chloride 0.9 % 100 mL IVPB (has no administration in time range)  azithromycin (ZITHROMAX) 500 mg in sodium chloride 0.9 % 250 mL IVPB (has no administration in time range)  acetaminophen (TYLENOL) tablet 650 mg (has no administration in time range)    Or  acetaminophen (TYLENOL) suppository 650 mg (has no administration in time range)  albuterol (PROVENTIL) (2.5 MG/3ML) 0.083% nebulizer solution 2.5 mg (has no administration in time range)  guaiFENesin (MUCINEX) 12 hr tablet 600 mg (has no administration in time range)  heparin injection 5,000 Units (has no administration in time range)  lactated ringers infusion (has no administration in time range)  riluzole (RILUTEK) tablet 50 mg (has no administration in time range)  pantoprazole (PROTONIX) injection 40 mg (has no administration in time range)  Chlorhexidine Gluconate Cloth 2 % PADS 6 each (has no administration in time range)  iohexol (OMNIPAQUE) 350 MG/ML injection 75 mL (75 mLs Intravenous Contrast Given 12/10/2022 1808)  cefTRIAXone (ROCEPHIN) 1 g in sodium chloride 0.9 % 100 mL IVPB (0 g Intravenous Stopped 11/21/2022 2012)    Radiological Exams on Admission: CT Angio Chest PE W and/or Wo Contrast  Result Date: 11/17/2022 CLINICAL DATA:  Shortness of breath. History of ALS. Concern for aspiration. EXAM: CT ANGIOGRAPHY CHEST WITH CONTRAST TECHNIQUE: Multidetector CT imaging of the chest was performed using the standard protocol during bolus administration of intravenous contrast. Multiplanar CT image reconstructions and MIPs were obtained to evaluate the vascular anatomy. RADIATION DOSE REDUCTION: This exam was  performed according to the departmental dose-optimization program which includes automated exposure control, adjustment of the mA and/or kV according to patient size and/or use of iterative reconstruction technique. CONTRAST:  75mL OMNIPAQUE IOHEXOL 350 MG/ML SOLN COMPARISON:  Chest radiograph 11/27/2022 FINDINGS: Cardiovascular: Normal heart size. No pericardial effusion. Coronary artery and aortic atherosclerotic calcification. Mediastinum/Nodes: Esophagus is unremarkable. No thoracic adenopathy. Lungs/Pleura: Frothy debris within the trachea and occluding the left mainstem bronchus and multiple left lingular and lower lobe bronchi. Left-greater-than-right basilar atelectasis. No pleural effusion or pneumothorax. Upper Abdomen: No acute abnormality. Musculoskeletal: No acute fracture. Review of the MIP images confirms the above findings. IMPRESSION: Aspirated debris within the trachea and occluding the left mainstem bronchus and multiple left lingular and lower lobe bronchi. Aortic Atherosclerosis (ICD10-I70.0). Electronically Signed   By: Minerva Fester M.D.   On: 11/19/2022 18:13   DG Chest Port 1 View  Result Date: 11/27/2022 CLINICAL DATA:  Shortness of breath.  History of ALS. EXAM: PORTABLE CHEST 1 VIEW COMPARISON:  04/24/22 FINDINGS: Heart size appears normal. No pleural effusion or airspace opacity. Mild peripheral increased septal markings identified. Visualized osseous structures are un remarkable. IMPRESSION: Mild peripheral increased septal markings suggest mild interstitial edema. No airspace opacities or atelectasis identified. Electronically Signed   By: Signa Kell M.D.   On: 11/22/2022 15:14     Data Reviewed: Relevant notes from primary care and  specialist visits, past discharge summaries as available in EHR, including Care Everywhere. Prior diagnostic testing as pertinent to current admission diagnoses Updated medications and problem lists for reconciliation ED course, including  vitals, labs, imaging, treatment and response to treatment Triage notes, nursing and pharmacy notes and ED provider's notes Notable results as noted in HPI  Assessment & Plan Acute respiratory failure with hypoxia (HCC) 2/2 to aspiration and obstruction of left main bronchus with layering  debris.  Cont with HFNC/ NIPPV as needed low threshold for ICU d/w ICU APP in event pt declines as his prognosis is guarded.  Continuous pulse oximetry. CPT q2 hours. Bronchodilator and nebulizer with hypertonic saline per RT.  Aspiration pneumonia (HCC) Aspiration precaution.  CPT per RT. NPO.  IV PPI.  Severe sepsis (HCC) Pt meets severe sepsis criteria with new need for oxygen.  Cont LR at 50 cc x 1 day. Cont rocephin/ azithromycin.  ALS (amyotrophic lateral sclerosis) (HCC) PT eval once Pneumonia is stable and pt is able to participate.  At baseline pt wallks with walker.  Coronary artery disease involving native heart without angina pectoris Mild TNI elevation due to sepsis.  No chest pain or Anginal symptoms and EKG non ischemic.   BPH (benign prostatic hyperplasia) No  meds on board. Will monitor strict I/O. Bladder scan if needed.   Protein-calorie malnutrition, severe BMI of 20.75. Nutritionist consult or pharmacy consult for options for improving his intake whether is to change tube feed formula etc.  Wife states they use formula that is lactose free for tube feeds_[employer] nutritional supplement as something he takes TID at breakfast-lunch-dinner  D/W her about holding tube feeds tonight.  We will start LR and hydrate overnight. POCT q4h x 3 occurrence to monitor for any hypoglycemia.    DVT prophylaxis:  Heparin   Consults:  Pulmonary: Dr. Aundria Rud. PCCM: Webb Silversmith.   Advance Care Planning:    Code Status: Full Code   Family Communication:  Wife.  Disposition Plan:  Home.  Severity of Illness: The appropriate patient status for this patient is  INPATIENT. Inpatient status is judged to be reasonable and necessary in order to provide the required intensity of service to ensure the patient's safety. The patient's presenting symptoms, physical exam findings, and initial radiographic and laboratory data in the context of their chronic comorbidities is felt to place them at high risk for further clinical deterioration. Furthermore, it is not anticipated that the patient will be medically stable for discharge from the hospital within 2 midnights of admission.   * I certify that at the point of admission it is my clinical judgment that the patient will require inpatient hospital care spanning beyond 2 midnights from the point of admission due to high intensity of service, high risk for further deterioration and high frequency of surveillance required.*  Author: Gertha Calkin, MD 12/24/2022 8:18 PM  For on call review [email or web address].

## 2022-12-01 NOTE — Assessment & Plan Note (Signed)
Mild TNI elevation due to sepsis.  No chest pain or Anginal symptoms and EKG non ischemic.

## 2022-12-01 NOTE — ED Notes (Signed)
Pt sats 96% on 15L HFNC. EDP made aware by RT. Will not need heated HFNC at this time.

## 2022-12-01 NOTE — ED Notes (Addendum)
Pt family to nurse station. Advised they are leaving. They want to leave pt meds here due to our pharmacy not having them available. Clelia Schaumann make assigned RN aware.

## 2022-12-02 DIAGNOSIS — E785 Hyperlipidemia, unspecified: Secondary | ICD-10-CM | POA: Diagnosis present

## 2022-12-02 DIAGNOSIS — J690 Pneumonitis due to inhalation of food and vomit: Secondary | ICD-10-CM | POA: Diagnosis present

## 2022-12-02 DIAGNOSIS — Z87891 Personal history of nicotine dependence: Secondary | ICD-10-CM | POA: Diagnosis not present

## 2022-12-02 DIAGNOSIS — Z66 Do not resuscitate: Secondary | ICD-10-CM | POA: Diagnosis present

## 2022-12-02 DIAGNOSIS — Z515 Encounter for palliative care: Secondary | ICD-10-CM

## 2022-12-02 DIAGNOSIS — Z888 Allergy status to other drugs, medicaments and biological substances status: Secondary | ICD-10-CM | POA: Diagnosis not present

## 2022-12-02 DIAGNOSIS — R6520 Severe sepsis without septic shock: Secondary | ICD-10-CM | POA: Diagnosis present

## 2022-12-02 DIAGNOSIS — R0602 Shortness of breath: Secondary | ICD-10-CM | POA: Diagnosis present

## 2022-12-02 DIAGNOSIS — R1310 Dysphagia, unspecified: Secondary | ICD-10-CM | POA: Diagnosis present

## 2022-12-02 DIAGNOSIS — R4701 Aphasia: Secondary | ICD-10-CM | POA: Diagnosis present

## 2022-12-02 DIAGNOSIS — F419 Anxiety disorder, unspecified: Secondary | ICD-10-CM | POA: Diagnosis present

## 2022-12-02 DIAGNOSIS — Z931 Gastrostomy status: Secondary | ICD-10-CM | POA: Diagnosis not present

## 2022-12-02 DIAGNOSIS — G1221 Amyotrophic lateral sclerosis: Secondary | ICD-10-CM | POA: Diagnosis present

## 2022-12-02 DIAGNOSIS — Z7189 Other specified counseling: Secondary | ICD-10-CM | POA: Diagnosis not present

## 2022-12-02 DIAGNOSIS — T17590A Other foreign object in bronchus causing asphyxiation, initial encounter: Secondary | ICD-10-CM | POA: Diagnosis not present

## 2022-12-02 DIAGNOSIS — J9602 Acute respiratory failure with hypercapnia: Secondary | ICD-10-CM | POA: Diagnosis present

## 2022-12-02 DIAGNOSIS — Z1152 Encounter for screening for COVID-19: Secondary | ICD-10-CM | POA: Diagnosis not present

## 2022-12-02 DIAGNOSIS — I2510 Atherosclerotic heart disease of native coronary artery without angina pectoris: Secondary | ICD-10-CM | POA: Diagnosis present

## 2022-12-02 DIAGNOSIS — N400 Enlarged prostate without lower urinary tract symptoms: Secondary | ICD-10-CM | POA: Diagnosis present

## 2022-12-02 DIAGNOSIS — Z6820 Body mass index (BMI) 20.0-20.9, adult: Secondary | ICD-10-CM | POA: Diagnosis not present

## 2022-12-02 DIAGNOSIS — I4892 Unspecified atrial flutter: Secondary | ICD-10-CM | POA: Diagnosis not present

## 2022-12-02 DIAGNOSIS — J9601 Acute respiratory failure with hypoxia: Secondary | ICD-10-CM | POA: Diagnosis present

## 2022-12-02 DIAGNOSIS — A419 Sepsis, unspecified organism: Secondary | ICD-10-CM | POA: Diagnosis present

## 2022-12-02 DIAGNOSIS — R64 Cachexia: Secondary | ICD-10-CM | POA: Diagnosis present

## 2022-12-02 DIAGNOSIS — J9809 Other diseases of bronchus, not elsewhere classified: Secondary | ICD-10-CM | POA: Diagnosis present

## 2022-12-02 DIAGNOSIS — R9431 Abnormal electrocardiogram [ECG] [EKG]: Secondary | ICD-10-CM | POA: Diagnosis not present

## 2022-12-02 DIAGNOSIS — I4891 Unspecified atrial fibrillation: Secondary | ICD-10-CM | POA: Diagnosis not present

## 2022-12-02 DIAGNOSIS — R627 Adult failure to thrive: Secondary | ICD-10-CM | POA: Diagnosis present

## 2022-12-02 LAB — BLOOD CULTURE ID PANEL (REFLEXED) - BCID2

## 2022-12-02 LAB — GLUCOSE, CAPILLARY
Glucose-Capillary: 107 mg/dL — ABNORMAL HIGH (ref 70–99)
Glucose-Capillary: 101 mg/dL — ABNORMAL HIGH (ref 70–99)
Glucose-Capillary: 99 mg/dL (ref 70–99)
Glucose-Capillary: 117 mg/dL — ABNORMAL HIGH (ref 70–99)
Glucose-Capillary: 113 mg/dL — ABNORMAL HIGH (ref 70–99)

## 2022-12-02 LAB — COMPREHENSIVE METABOLIC PANEL
ALT: 24 U/L (ref 0–44)
AST: 24 U/L (ref 15–41)
Albumin: 3.5 g/dL (ref 3.5–5.0)
Alkaline Phosphatase: 43 U/L (ref 38–126)
Anion gap: 9 (ref 5–15)
BUN: 20 mg/dL (ref 8–23)
CO2: 28 mmol/L (ref 22–32)
Calcium: 8.9 mg/dL (ref 8.9–10.3)
Chloride: 101 mmol/L (ref 98–111)
Creatinine, Ser: 0.38 mg/dL — ABNORMAL LOW (ref 0.61–1.24)
GFR, Estimated: >60 mL/min (ref >60)
Glucose, Bld: 115 mg/dL — ABNORMAL HIGH (ref 70–99)
Potassium: 3.8 mmol/L (ref 3.5–5.1)
Sodium: 138 mmol/L (ref 135–145)
Total Bilirubin: 0.9 mg/dL (ref 0.3–1.2)
Total Protein: 5.8 g/dL — ABNORMAL LOW (ref 6.5–8.1)

## 2022-12-02 LAB — CBC
HCT: 38 % — ABNORMAL LOW (ref 39.0–52.0)
Hemoglobin: 12.2 g/dL — ABNORMAL LOW (ref 13.0–17.0)
MCH: 29.4 pg (ref 26.0–34.0)
MCHC: 32.1 g/dL (ref 30.0–36.0)
MCV: 91.6 fL (ref 80.0–100.0)
Platelets: 172 10*3/uL (ref 150–400)
RBC: 4.15 MIL/uL — ABNORMAL LOW (ref 4.22–5.81)
RDW: 13.2 % (ref 11.5–15.5)
WBC: 12.1 10*3/uL — ABNORMAL HIGH (ref 4.0–10.5)
nRBC: 0 % (ref 0.0–0.2)

## 2022-12-02 LAB — PHOSPHORUS: Phosphorus: 3.9 mg/dL (ref 2.5–4.6)

## 2022-12-02 LAB — EXPECTORATED SPUTUM ASSESSMENT W GRAM STAIN, RFLX TO RESP C

## 2022-12-02 LAB — MAGNESIUM: Magnesium: 2.3 mg/dL (ref 1.7–2.4)

## 2022-12-02 LAB — MRSA NEXT GEN BY PCR, NASAL: MRSA by PCR Next Gen: NOT DETECTED

## 2022-12-02 LAB — LACTIC ACID, PLASMA: Lactic Acid, Venous: 0.8 mmol/L (ref 0.5–1.9)

## 2022-12-02 MED ORDER — SODIUM CHLORIDE 0.9 % IV SOLN
3.0000 g | Freq: Four times a day (QID) | INTRAVENOUS | Status: DC
  Administered 2022-12-02 (×3): 3 g via INTRAVENOUS
  Filled 2022-12-02 (×4): qty 8

## 2022-12-02 MED ORDER — IPRATROPIUM-ALBUTEROL 0.5-2.5 (3) MG/3ML IN SOLN
3.0000 mL | RESPIRATORY_TRACT | Status: DC
  Administered 2022-12-02 (×2): 3 mL via RESPIRATORY_TRACT
  Filled 2022-12-02 (×2): qty 3

## 2022-12-02 MED ORDER — FREE WATER
240.0000 mL | Freq: Three times a day (TID) | Status: DC
  Administered 2022-12-03 – 2022-12-04 (×4): 240 mL

## 2022-12-02 MED ORDER — LORAZEPAM 1 MG PO TABS
0.5000 mg | ORAL_TABLET | Freq: Every day | ORAL | Status: DC | PRN
  Administered 2022-12-02: 0.5 mg via ORAL

## 2022-12-02 MED ORDER — LORAZEPAM 1 MG PO TABS
0.5000 mg | ORAL_TABLET | Freq: Two times a day (BID) | ORAL | Status: DC
  Administered 2022-12-02 – 2022-12-04 (×4): 0.5 mg
  Filled 2022-12-02 (×4): qty 1

## 2022-12-02 MED ORDER — BUDESONIDE 0.5 MG/2ML IN SUSP
0.5000 mg | Freq: Two times a day (BID) | RESPIRATORY_TRACT | Status: DC
  Administered 2022-12-02: 0.5 mg via RESPIRATORY_TRACT
  Filled 2022-12-02: qty 2

## 2022-12-02 MED ORDER — CIPROFLOXACIN HCL 0.3 % OP SOLN: 1.0000 [drp] | Freq: Every day | OPHTHALMIC | Status: DC | PRN

## 2022-12-02 MED ORDER — GLYCOPYRROLATE 1 MG PO TABS
2.0000 mg | ORAL_TABLET | Freq: Three times a day (TID) | ORAL | Status: DC
  Administered 2022-12-02 – 2022-12-04 (×5): 2 mg
  Filled 2022-12-02 (×8): qty 2

## 2022-12-02 MED ORDER — KATE FARMS STANDARD 1.4 PO LIQD
650.0000 mL | Freq: Three times a day (TID) | ORAL | Status: DC
  Administered 2022-12-03: 650 mL via ORAL
  Filled 2022-12-02: qty 650

## 2022-12-02 MED ORDER — GUAIFENESIN 100 MG/5ML PO LIQD
15.0000 mL | Freq: Four times a day (QID) | ORAL | Status: DC | PRN
  Administered 2022-12-04: 15 mL
  Filled 2022-12-02: qty 20

## 2022-12-02 MED ORDER — GLYCOPYRROLATE 0.2 MG/ML IJ SOLN
0.2000 mg | Freq: Once | INTRAMUSCULAR | Status: AC
  Administered 2022-12-02: 0.2 mg via INTRAVENOUS
  Filled 2022-12-02: qty 1

## 2022-12-02 MED ORDER — MORPHINE SULFATE (PF) 2 MG/ML IV SOLN
2.0000 mg | INTRAVENOUS | Status: DC | PRN
  Administered 2022-12-02 (×2): 2 mg via INTRAVENOUS
  Filled 2022-12-02: qty 1

## 2022-12-02 MED ORDER — LORAZEPAM 1 MG PO TABS: 0.5000 mg | ORAL_TABLET | Freq: Two times a day (BID) | ORAL | Status: DC

## 2022-12-02 MED ORDER — GUAIFENESIN 100 MG/5ML PO LIQD: 15.0000 mL | Freq: Four times a day (QID) | ORAL | Status: DC

## 2022-12-02 MED ORDER — MORPHINE SULFATE (PF) 2 MG/ML IV SOLN
2.0000 mg | INTRAVENOUS | Status: DC | PRN
  Administered 2022-12-02 – 2022-12-04 (×2): 2 mg via INTRAVENOUS
  Filled 2022-12-02 (×2): qty 1

## 2022-12-02 MED ORDER — PIPERACILLIN-TAZOBACTAM 3.375 G IVPB
3.3750 g | Freq: Three times a day (TID) | INTRAVENOUS | Status: DC
  Administered 2022-12-02 – 2022-12-04 (×5): 3.375 g via INTRAVENOUS
  Filled 2022-12-02 (×5): qty 50

## 2022-12-02 MED ORDER — MORPHINE SULFATE (PF) 2 MG/ML IV SOLN
INTRAVENOUS | Status: AC
  Filled 2022-12-02: qty 1

## 2022-12-02 MED ORDER — VITAL HIGH PROTEIN PO LIQD: 1000.0000 mL | ORAL | Status: DC

## 2022-12-02 MED ORDER — SODIUM CHLORIDE 0.9 % IV SOLN: 1.5000 g | Freq: Four times a day (QID) | INTRAVENOUS | Status: DC

## 2022-12-02 NOTE — Evaluation (Signed)
Occupational Therapy Evaluation Patient Details Name: Donald Gould MRN: [id number] DOB: Mar 09, 1951 Today's Date: 12/02/2022   History of Present Illness Pt is a 72 y.o. male  with past medical history of ALS, aphasia, dysphagia, history of aspiration pneumonia, history of heart disease, history of BPH, protein calorie malnutrition severe, small bowel obstruction and earlier part of this year, presenting with concerns today of shortness of breath and suspicion of aspiration.  Per reportwife states that their suctioning went out yesterday due to his history of ALS patient requires frequent deep suctioning.Today he was with home health and was so weak they had to lay him on the ground.   Clinical Impression   Patient presenting with decreased Ind in self care,balance, functional mobility/transfers, endurance, and safety awareness. Patient's family present to assist with home set up. Pt lives at home with wife and utilizes RW for short distance ambulation. He does have HH aide that comes 3x/wk to assist with self care tasks. Pt is nonverbal and utilizes paper and pen with gestures for communication as needed during this session. Pt perseverating on oral hygiene and mouth being dry with his utilizing wet swab and suction multiple times for mouth. Pt uses washcloth with set up A from bed level to wash face and hands with min cuing. Pt's HR currently 140's while supine in bed limiting pt as well as pt declining to attempt bed mobility or further self care tasks. Pt did set up a time for timorrow to attempt standing with PT and OT. Patient will benefit from acute OT to increase overall independence in the areas of ADLs, functional mobility, and safety awareness in order to safely discharge .      If plan is discharge home, recommend the following: A lot of help with walking and/or transfers;A lot of help with bathing/dressing/bathroom;Assistance with cooking/housework;Assist for transportation;Help with  stairs or ramp for entrance;Direct supervision/assist for financial management;Direct supervision/assist for medications management    Functional Status Assessment  Patient has had a recent decline in their functional status and demonstrates the ability to make significant improvements in function in a reasonable and predictable amount of time.  Equipment Recommendations  Other (comment) (defer to next venue of care)       Precautions / Restrictions Precautions Precautions: Fall      Mobility Bed Mobility               General bed mobility comments: Pt refuses    Transfers                   General transfer comment: Pt refuses      Balance Overall balance assessment: History of Falls, Needs assistance                                         ADL either performed or assessed with clinical judgement   ADL Overall ADL's : Needs assistance/impaired     Grooming: Wash/dry face;Wash/dry hands;Oral care;Set up;Bed level Grooming Details (indicate cue type and reason): Pt's mouth is dry and he continuously uses suction and swab. He washes face and hands with set up A.                               General ADL Comments: Pt refuses all bed mobility and transfers this session and makes plan to engage  in with therapist in the morning.     Vision Patient Visual Report: No change from baseline              Pertinent Vitals/Pain Pain Assessment Pain Assessment: No/denies pain     Extremity/Trunk Assessment Upper Extremity Assessment Upper Extremity Assessment: Generalized weakness   Lower Extremity Assessment Lower Extremity Assessment: Generalized weakness       Communication Communication Communication: Other (comment) (non verbal and uses pen and paper along with gestures) Cueing Techniques: Verbal cues;Tactile cues;Gestural cues   Cognition Arousal: Alert Behavior During Therapy: WFL for tasks  assessed/performed Overall Cognitive Status: Within Functional Limits for tasks assessed                                                  Home Living Family/patient expects to be discharged to:: Private residence Living Arrangements: Spouse/significant other Available Help at Discharge: Family;Available 24 hours/day;Personal care attendant;Available PRN/intermittently Type of Home: House Home Access: Stairs to enter [employer] of Steps: 4 Entrance Stairs-Rails: Can reach both;Right;Left Home Layout: Multi-level;Able to live on main level with bedroom/bathroom     Bathroom Shower/Tub: Sponge bathes at baseline;Other (comment)   Bathroom Toilet: Standard     Home Equipment: Agricultural consultant (2 wheels);Shower seat          Prior Functioning/Environment Prior Level of Function : Needs assist;History of Falls (last six months)             Mobility Comments: requires assists with bed mobility as of recent, transfers, and short distance amb ADLs Comments: Pt has an aide 3x/wk to assist with self care tasks. Family assists with IADLs. Pt is able to feed self and perform oral hygiene with set up A.        OT Problem List: Decreased strength;Decreased activity tolerance;Decreased safety awareness;Impaired balance (sitting and/or standing);Decreased knowledge of use of DME or AE;Cardiopulmonary status limiting activity      OT Treatment/Interventions: Self-care/ADL training;Therapeutic exercise;Therapeutic activities;Energy conservation;DME and/or AE instruction;Patient/family education;Balance training;Manual therapy    OT Goals(Current goals can be found in the care plan section) Acute Rehab OT Goals Patient Stated Goal: none stated Time For Goal Achievement: 12/16/22 Potential to Achieve Goals: Fair ADL Goals Pt Will Perform Grooming: with set-up;with supervision;sitting Pt Will Perform Lower Body Dressing: with min assist;sit to/from stand Pt  Will Transfer to Toilet: with min assist;ambulating Pt Will Perform Toileting - Clothing Manipulation and hygiene: with min assist;sit to/from stand  OT Frequency: Min 1X/week       AM-PAC OT "6 Clicks" Daily Activity     Outcome Measure Help from another person eating meals?: A Little Help from another person taking care of personal grooming?: A Little Help from another person toileting, which includes using toliet, bedpan, or urinal?: A Lot Help from another person bathing (including washing, rinsing, drying)?: A Lot Help from another person to put on and taking off regular upper body clothing?: A Lot Help from another person to put on and taking off regular lower body clothing?: Total 6 Click Score: 13   End of Session Equipment Utilized During Treatment: Oxygen (10Ls via HFNC) Nurse Communication: Mobility status  Activity Tolerance: Other (comment) (Pt self limits as well as HR being 140's while supine in bed) Patient left: in bed;with call bell/phone within reach;with bed alarm set;with family/visitor present  OT Visit Diagnosis:  Unsteadiness on feet (R26.81);Repeated falls (R29.6);Muscle weakness (generalized) (M62.81)                Time: 6440-3474 OT Time Calculation (min): 17 min Charges:  OT General Charges $OT Visit: 1 Visit OT Evaluation $OT Eval Moderate Complexity: 1 Mod OT Treatments $Therapeutic Activity: 8-22 mins  Jackquline Denmark, MS, OTR/L , CBIS ascom [phone]  12/02/22, 5:07 PM

## 2022-12-02 NOTE — Progress Notes (Addendum)
[id number] Patient very agitated. 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 0830 Wife in. Patient calmer. Writing his wishes to wife. 1030 Pallative care in to talk with wife and husband about goals of care. Goals of care established. DNR/DNI now.. 1200- 1300 Entertained by brother and great niece 48 Given [facility] tube feeding bolus 459 ml as ordered per peg. Followed by 120 ml of sterile water. Patient stopped nurse after 120 ml of water because his belly was tight. Other 130 ml of water not given. 1600 Attempted to sit on bedpan for BM. Patient did not want bed pan so attempted to stand patient and pivot to [facility]. Patient unable to pivot so sat on the bedside on bed pan. Could not have BM. Placed back in bed with great difficulty.  Repositioned for comfort. 1650 Has been agitated and restless since tube feeding at 1340. SOB. Morp[hine given to patient for comfort. 1700 Wife went home and patient settled down. Dozing at short intervals

## 2022-12-02 NOTE — Evaluation (Signed)
Physical Therapy Evaluation Patient Details Name: Donald Gould MRN: [id number] DOB: February 09, 1951 Today's Date: 12/02/2022  History of Present Illness  Pt is a 72 y.o. male  with past medical history of ALS, aphasia, dysphagia, history of aspiration pneumonia, history of heart disease, history of BPH, protein calorie malnutrition severe, small bowel obstruction and earlier part of this year, presenting with concerns today of shortness of breath and suspicion of aspiration.  Per reportwife states that their suctioning went out yesterday due to his history of ALS patient requires frequent deep suctioning.Today he was with home health and was so weak they had to lay him on the ground.  Clinical Impression   Pt is received in bed with spouse at bedside, he is agreeable to PT session. Pt's spouse, [name], assisted with subjective intake secondary to Pt non-verbal and use of pen and paper for short responses. At baseline, Pt utilizes a RW to amb short distances within the home and aide for bed baths. Pt's spouse reports recent history of falls having 4 in the last 6 months. Pt limited in overall mobility during today's session secondary to fatigue and fear of falling. Educated Pt and spouse on benefits behind progressing mobility to PLOF to focus on BLE strengthening and independence. Additionally, educated Pt and spouse about benefits of a wheelchair for increased mobility safety and navigation of stairs. Pt and spouse verbalized understanding. Pt would benefit from skilled PT to address above deficits and promote optimal return to PLOF.        If plan is discharge home, recommend the following: A lot of help with walking and/or transfers;A lot of help with bathing/dressing/bathroom;Help with stairs or ramp for entrance;Assist for transportation   Can travel by private vehicle   No    Equipment Recommendations Wheelchair (measurements PT);BSC/3in1  Recommendations for Other Services        Functional Status Assessment Patient has had a recent decline in their functional status and demonstrates the ability to make significant improvements in function in a reasonable and predictable amount of time.     Precautions / Restrictions Precautions Precautions: Fall Restrictions Weight Bearing Restrictions: No      Mobility  Bed Mobility Overal bed mobility: Needs Assistance             General bed mobility comments: Unable to properly assess bed mobility secondary to Pt's request to not move at this time; spouse reports recent required assistance for BLE management when returning to bed    Transfers                   General transfer comment: Unable to perform at this time secondary to Pt's reports of fear    Ambulation/Gait               General Gait Details: Not performed secondary to Pt's request and safety concerns  Stairs            Wheelchair Mobility     Tilt Bed    Modified Rankin (Stroke Patients Only)       Balance Overall balance assessment: History of Falls, Needs assistance     Sitting balance - Comments: Unable to assess during today's visit but spouse reports Pt requires BUE support on bed       Standing balance comment: Unable to assess during today's visit but spouse reports Pt requires AD for standing balance  Pertinent Vitals/Pain Pain Assessment Pain Assessment: No/denies pain    Home Living Family/patient expects to be discharged to:: Private residence Living Arrangements: Spouse/significant other Available Help at Discharge: Family;Available 24 hours/day;Personal care attendant;Available PRN/intermittently (spouse and aides assist patient 24/7. Aides are there 3x/week for 4 hours at a time) Type of Home: House Home Access: Stairs to enter Entrance Stairs-Rails: Can reach both;Right;Left Entrance Stairs-Number of Steps: 4   Home Layout: Multi-level;Able to live on  main level with bedroom/bathroom Home Equipment: Rolling Walker (2 wheels);Shower seat      Prior Function Prior Level of Function : Needs assist;History of Falls (last six months)       Physical Assist : Mobility (physical);ADLs (physical) Mobility (physical): Transfers;Bed mobility;Gait ADLs (physical): Bathing;Dressing Mobility Comments: requires assists with bed mobility as of recent, transfers, and short distance amb ADLs Comments: needs assistance with bathing     Extremity/Trunk Assessment   Upper Extremity Assessment Upper Extremity Assessment: Defer to OT evaluation;Generalized weakness    Lower Extremity Assessment Lower Extremity Assessment: RLE deficits/detail;LLE deficits/detail RLE Deficits / Details: grossly 3-/5 MMT with tightness observed RLE Sensation: WNL RLE Coordination: WNL LLE Deficits / Details: grossly 3+/5 MMT with decreased mobility initiation LLE Sensation: WNL LLE Coordination: WNL       Communication   Communication Communication: Other (comment) (non-verbal with use of pen and paper to communicate; short responses only) Cueing Techniques: Verbal cues;Tactile cues  Cognition Arousal: Alert Behavior During Therapy: WFL for tasks assessed/performed Overall Cognitive Status: Within Functional Limits for tasks assessed                                 General Comments: Pleasant and agreeable to therapy        General Comments General comments (skin integrity, edema, etc.): min L knee abrasians that are healing appropriate secondary to fall    Exercises Other Exercises Other Exercises: supine BLE exercises: x5 heel slides AAROM (R>L), x5 hip ABD/ADD, x8 ankle pumps   Assessment/Plan    PT Assessment Patient needs continued PT services  PT Problem List Decreased strength;Decreased mobility;Decreased range of motion;Decreased activity tolerance;Decreased balance;Pain       PT Treatment Interventions DME instruction;Gait  training;Therapeutic exercise;Balance training;Functional mobility training;Therapeutic activities;Stair training    PT Goals (Current goals can be found in the Care Plan section)  Acute Rehab PT Goals Patient Stated Goal: to get better PT Goal Formulation: With patient Time For Goal Achievement: 12/16/22 Potential to Achieve Goals: Fair    Frequency Min 1X/week     Co-evaluation               AM-PAC PT "6 Clicks" Mobility  Outcome Measure Help needed turning from your back to your side while in a flat bed without using bedrails?: A Little Help needed moving from lying on your back to sitting on the side of a flat bed without using bedrails?: A Little Help needed moving to and from a bed to a chair (including a wheelchair)?: A Lot Help needed standing up from a chair using your arms (e.g., wheelchair or bedside chair)?: A Lot Help needed to walk in hospital room?: A Lot Help needed climbing 3-5 steps with a railing? : Total 6 Click Score: 13    End of Session   Activity Tolerance: Patient limited by fatigue Patient left: in bed;with call bell/phone within reach;with family/visitor present Nurse Communication: Mobility status PT Visit Diagnosis: Unsteadiness on feet (  R26.81);Repeated falls (R29.6);History of falling (Z91.81);Muscle weakness (generalized) (M62.81)    Time: 1610-9604 PT Time Calculation (min) (ACUTE ONLY): 24 min   Charges:                 Elmon Else, SPT   Jorgen Wolfinger 12/02/2022, 12:18 PM

## 2022-12-02 NOTE — NC FL2 (Signed)
[place] MEDICAID FL2 LEVEL OF CARE FORM     IDENTIFICATION  Patient Name: Donald Gould Birthdate: [birth year] Sex: male Admission Date (Current Location): 12/30/22  [facility] [place] Number:  Chiropodist and Address:         Provider Number: [id number]  Attending Physician Name and Address:  Loyce Dys, MD  Relative Name and Phone Number:       Current Level of Care: Hospital Recommended Level of Care: Skilled Nursing Facility Prior Approval Number:    Date Approved/Denied:   PASRR Number: [id number]  Discharge Plan: SNF    Current Diagnoses: Patient Active Problem List   Diagnosis Date Noted   Severe sepsis (HCC) 12/30/2022   Acute respiratory failure with hypoxia (HCC) 30-Dec-2022   Aspiration pneumonia (HCC) 30-Dec-2022   Protein-calorie malnutrition, severe 05/14/2022   BPH (benign prostatic hyperplasia) 05/12/2022   Constipation 05/12/2022   Small bowel obstruction (HCC) 05/11/2022   Preoperative evaluation to rule out surgical contraindication 08/16/2021   ALS (amyotrophic lateral sclerosis) (HCC) 02/05/2021   Hypercholesterolemia 01/16/2021   Dysarthria 01/16/2021   Myalgia due to statin 01/16/2021   Unintentional weight loss 01/16/2021   Coronary artery disease involving native heart without angina pectoris 06/23/2015   Statin intolerance 06/23/2015    Orientation RESPIRATION BLADDER Height & Weight     Self, Place (nonverbal due to ALS)  O2 (8L HFNC with plans to wean) Incontinent Weight: 75.3 kg Height:  6\' 3"  (190.5 cm)  BEHAVIORAL SYMPTOMS/MOOD NEUROLOGICAL BOWEL NUTRITION STATUS      Incontinent Diet (NPO will advance prior to discharge)  AMBULATORY STATUS COMMUNICATION OF NEEDS Skin   Total Care Non-Verbally Normal                       Personal Care Assistance Level of Assistance              Functional Limitations Info             SPECIAL CARE FACTORS FREQUENCY  PT (By licensed PT), OT (By licensed  OT)                    Contractures Contractures Info: Not present    Additional Factors Info  Code Status, Allergies Code Status Info: DNR Allergies Info: Other, Statins           Current Medications (12/02/2022):  This is the current hospital active medication list Current Facility-Administered Medications  Medication Dose Route Frequency Provider Last Rate Last Admin   acetaminophen (TYLENOL) tablet 650 mg  650 mg Oral Q6H PRN Gertha Calkin, MD       Or   acetaminophen (TYLENOL) suppository 650 mg  650 mg Rectal Q6H PRN Gertha Calkin, MD       albuterol (PROVENTIL) (2.5 MG/3ML) 0.083% nebulizer solution 2.5 mg  2.5 mg Nebulization Q2H PRN Gertha Calkin, MD       Ampicillin-Sulbactam (UNASYN) 3 g in sodium chloride 0.9 % 100 mL IVPB  3 g Intravenous Q6H Irena Cords V, MD 200 mL/hr at 12/02/22 1323 3 g at 12/02/22 1323   budesonide (PULMICORT) nebulizer solution 0.5 mg  0.5 mg Nebulization BID Erin Fulling, MD   0.5 mg at 12/02/22 1053   Chlorhexidine Gluconate Cloth 2 % PADS 6 each  6 each Topical Daily Gertha Calkin, MD   6 each at 12/02/22 1000   feeding supplement (KATE FARMS STANDARD 1.4) liquid 650 mL  650 mL  Oral TID WC Loyce Dys, MD       free water 240 mL  240 mL Per Tube TID WC Loyce Dys, MD       glycopyrrolate (ROBINUL) tablet 2 mg  2 mg Per Tube TID Irena Cords V, MD   2 mg at 12/02/22 1225   guaiFENesin (ROBITUSSIN) 100 MG/5ML liquid 15 mL  15 mL Per Tube Q6H PRN Gertha Calkin, MD       heparin injection 5,000 Units  5,000 Units Subcutaneous Q8H Gertha Calkin, MD   5,000 Units at 12/02/22 1326   hyoscyamine (LEVSIN SL) SL tablet 0.125 mg  0.125 mg Sublingual Q4H PRN Jimmye Norman, NP   0.125 mg at 12/02/22 1225   ipratropium-albuterol (DUONEB) 0.5-2.5 (3) MG/3ML nebulizer solution 3 mL  3 mL Nebulization Q4H Erin Fulling, MD   3 mL at 12/02/22 1450   lactated ringers infusion   Intravenous Continuous Gertha Calkin, MD   Stopped at 12/02/22 0645    LORazepam (ATIVAN) tablet 0.5 mg  0.5 mg Oral Daily PRN Ulice Bold, NP   0.5 mg at 12/02/22 1225   LORazepam (ATIVAN) tablet 0.5 mg  0.5 mg Per Tube BID Yong Channel C, NP       morphine (PF) 2 MG/ML injection 2 mg  2 mg Intravenous Q4H PRN Ulice Bold, NP       Oral care mouth rinse  15 mL Mouth Rinse PRN Jimmye Norman, NP       pantoprazole (PROTONIX) injection 40 mg  40 mg Intravenous Q12H Irena Cords V, MD   40 mg at 12/02/22 1055   riluzole (RILUTEK) tablet 50 mg  50 mg Per Tube Q12H Gertha Calkin, MD   50 mg at 12/02/22 1226     Discharge Medications: Please see discharge summary for a list of discharge medications.  Relevant Imaging Results:  Relevant Lab Results:   Additional Information ss [id number]  Chapman Fitch, RN

## 2022-12-02 NOTE — TOC Initial Note (Signed)
Transition of Care [facility]) - Initial/Assessment Note    Patient Details  Name: Donald Gould MRN: [id number] Date of Birth: 12-27-1950  [facility]) CM/SW Contact:    Chapman Fitch, RN Phone Number: 12/02/2022, 3:41 PM  Clinical Narrative:                   Admitted for: respiratory failure  Admitted from: home with wife.  [facility] services Tuesdays and Thursdays from 12pm - 4pm, and they also come on Sunday Mornings  PCP: [id number]  Current home health/prior home health/DME: RW, shower seat in the home  Patient nonverbal, per wife sandy she feels like has cognitive  declines and isn't fully understanding things  Therapy recommending SNF.  Wife agreeable to bed search PASRR obtained Fl2 sent for signature Bed search initiated      Expected Discharge Plan: Skilled Nursing Facility     Patient Goals and CMS Choice            Expected Discharge Plan and Services                                              Prior Living Arrangements/Services                       Activities of Daily Living Home Assistive Devices/Equipment: Feeding equipment, Other (Comment) (Suction) ADL Screening (condition at time of admission) Patient's cognitive ability adequate to safely complete daily activities?: Yes Is the patient deaf or have difficulty hearing?: No Does the patient have difficulty seeing, even when wearing glasses/contacts?: No Does the patient have difficulty concentrating, remembering, or making decisions?: No Patient able to express need for assistance with ADLs?: Yes Does the patient have difficulty dressing or bathing?: Yes Independently performs ADLs?: No Does the patient have difficulty walking or climbing stairs?: Yes Weakness of Legs: Both Weakness of Arms/Hands: Both  Permission Sought/Granted                  Emotional Assessment              Admission diagnosis:  SOB (shortness of  breath) [R06.02] Acute respiratory failure with hypoxia (HCC) [J96.01] Aspiration into airway, initial encounter [T17.908A] Patient Active Problem List   Diagnosis Date Noted   Severe sepsis (HCC) 12/18/22   Acute respiratory failure with hypoxia (HCC) 12/18/22   Aspiration pneumonia (HCC) 2022-12-18   Protein-calorie malnutrition, severe 05/14/2022   BPH (benign prostatic hyperplasia) 05/12/2022   Constipation 05/12/2022   Small bowel obstruction (HCC) 05/11/2022   Preoperative evaluation to rule out surgical contraindication 08/16/2021   ALS (amyotrophic lateral sclerosis) (HCC) 02/05/2021   Hypercholesterolemia 01/16/2021   Dysarthria 01/16/2021   Myalgia due to statin 01/16/2021   Unintentional weight loss 01/16/2021   Coronary artery disease involving native heart without angina pectoris 06/23/2015   Statin intolerance 06/23/2015   PCP:  Kandyce Rud, MD Pharmacy:   [facility] [phone] Judithann Sheen, [place] - [address] [place] [address] Phone: [phone] Fax: [phone]     Social Determinants of Health (SDOH) Social History: SDOH Screenings   Food Insecurity: Patient Unable To Answer (12/02/2022)  Housing: High Risk (12/02/2022)  Transportation Needs: Patient Unable To Answer (12/02/2022)  Utilities: Patient Unable To Answer (12/02/2022)  Financial Resource Strain: Patient Declined (01/17/2022)   Received  [facility], [facility]  Social Connections: Unknown (07/29/2021)   Received from [facility], [facility]  Tobacco Use: Medium Risk (2022/12/10)   SDOH Interventions:     Readmission Risk Interventions     No data to display

## 2022-12-02 NOTE — Plan of Care (Signed)
Patient admitted to [facility] overnight. Patient is now requiring continuous BiPAP for respiratory support. Patient is receiving MIVF and IV antibiotics.   Problem: Activity: Goal: Ability to tolerate increased activity will improve Outcome: Not Progressing   Problem: Clinical Measurements: Goal: Ability to maintain a body temperature in the normal range will improve Outcome: Not Progressing   Problem: Respiratory: Goal: Ability to maintain adequate ventilation will improve Outcome: Not Progressing Goal: Ability to maintain a clear airway will improve Outcome: Not Progressing   Problem: Education: Goal: Knowledge of General Education information will improve Description: Including pain rating scale, medication(s)/side effects and non-pharmacologic comfort measures Outcome: Not Progressing   Problem: Health Behavior/Discharge Planning: Goal: Ability to manage health-related needs will improve Outcome: Not Progressing   Problem: Clinical Measurements: Goal: Ability to maintain clinical measurements within normal limits will improve Outcome: Not Progressing Goal: Will remain free from infection Outcome: Not Progressing Goal: Diagnostic test results will improve Outcome: Not Progressing Goal: Respiratory complications will improve Outcome: Not Progressing Goal: Cardiovascular complication will be avoided Outcome: Not Progressing   Problem: Activity: Goal: Risk for activity intolerance will decrease Outcome: Not Progressing   Problem: Nutrition: Goal: Adequate nutrition will be maintained Outcome: Not Progressing   Problem: Coping: Goal: Level of anxiety will decrease Outcome: Not Progressing   Problem: Elimination: Goal: Will not experience complications related to bowel motility Outcome: Not Progressing Goal: Will not experience complications related to urinary retention Outcome: Not Progressing   Problem: Pain Managment: Goal: General experience of comfort will  improve Outcome: Not Progressing   Problem: Safety: Goal: Ability to remain free from injury will improve Outcome: Not Progressing   Problem: Skin Integrity: Goal: Risk for impaired skin integrity will decrease Outcome: Not Progressing

## 2022-12-02 NOTE — Consult Note (Signed)
NAME:  Donald Gould, MRN:  [id number], DOB:  1950/08/20, LOS: 1 ADMISSION DATE:  11/22/2022, CONSULTATION DATE:  12/02/2022 REFERRING MD:  Earnie Larsson  CHIEF COMPLAINT:  Acute Respiratory Failure    HPI  72 y.o male with significant PMH of ALS (amyotrophic lateral sclerosis), thrombocytopenia, Chronic Anemia, unintentional  weight loss, dysphagia  w/recurrent aspiration and Aspiration pneumonia s/p PEG placement, BPH, HLD, who presented to the ED with chief complaints of near syncopal episode and possible aspiration.   ED Course: Initial vital signs showed HR of 92 beats/minute, BP 142/80 mm Hg, the RR 31 breaths/minute, and the oxygen saturation 92% on [place] and a temperature of 98.51F (36.7C). Pertinent Labs/Diagnostics Findings: Na+/ K+:139/4.0  Glucose: 111 BUN/Cr.:24/0.49 WBC:12.3  PCT: negative <0.10  Lactic acid: 0.8 COVID PCR: Negative,  troponin:20  CTA Chest> Aspirated debris within the trachea and occluding the left mainstem bronchus and multiple left lingular and lower lobe bronchi.  Patient was treated in the ED with DuoNebs and hypertonic saline nebulizers.  Was started on broad-spectrum antibiotic for aspiration pneumonia and admitted to hospitalist service.  PCCM consulted to evaluate for possible bronchoscopy and high risk for intubation   Past Medical History  ALS (amyotrophic lateral sclerosis), thrombocytopenia, Chronic Anemia, unintentional  weight loss, dysphagia  w/recurrent aspiration and Aspiration pneumonia s/p PEG placement, BPH, HLD,  Significant Hospital Events   9/15: Admitted to ICU with acute hypoxic respiratory failure in the setting of aspiration pneumonia and progressive Neuromuscular lung disease  9/16 resp distress, wean off biPAP as tolerated  Consults:  PCCM  Procedures:  None  Significant Diagnostic Tests:  11/22/2022: Chest Xray> IMPRESSION: Mild peripheral increased septal markings suggest mild interstitial edema. No airspace  opacities or atelectasis identified.  12/16/2022: CTA Chest > IMPRESSION: Aspirated debris within the trachea and occluding the left mainstem bronchus and multiple left lingular and lower lobe bronchi.  Interim History / Subjective:    -  Micro Data:  9/15: SARS-CoV-2 PCR> negative 9/15: Influenza PCR> negative 9/15: Blood culture x2> 20-Dec-2022: MRSA PCR>>   Antimicrobials:  Azithromycin 9/15 x 1 Ceftriaxone 9/15 x 1 Unasyn 9/16>  OBJECTIVE  Blood pressure 121/76, pulse 76, temperature 97.8 F (36.6 C), resp. rate (!) 26, height 6\' 3"  (1.905 m), weight 75.3 kg, SpO2 100%.    FiO2 (%):  [50 %-100 %] 50 %   Intake/Output Summary (Last 24 hours) at 12/02/2022 0905 Last data filed at 12/02/2022 0700 Gross per 24 hour  Intake 741.63 ml  Output 250 ml  Net 491.63 ml   Filed Weights   12/02/2022 1456  Weight: 75.3 kg       Review of Systems: Gen:  moans and groans Unable to speak Other:  All other systems negative   Physical Examination:   General Appearance: + distress  EYES PERRLA, EOM intact.   NECK Supple, No JVD Pulmonary: normal breath sounds, +rhonchi  CardiovascularNormal S1,S2.  No m/r/g.   Abdomen: Benign, Soft, non-tender. Neurology UE/LE 3/5 strength, no focal deficits Unable to speak , understands conversation Ext pulses intact, cap refill intact ALL OTHER ROS ARE NEGATIVE        Labs/imaging that I havepersonally reviewed  (right click and "Reselect all SmartList Selections" daily)     Labs   CBC: Recent Labs  Lab 11/27/2022 1534 12/02/22 0319  WBC 12.3* 12.1*  HGB 13.0 12.2*  HCT 40.2 38.0*  MCV 92.2 91.6  PLT 170 172    Basic Metabolic Panel: Recent Labs  Lab  12-27-22 1534 12/02/22 0319  NA 139 138  K 4.0 3.8  CL 97* 101  CO2 30 28  GLUCOSE 111* 115*  BUN 24* 20  CREATININE 0.49* 0.38*  CALCIUM 9.1 8.9  MG 2.2 2.3  PHOS 4.0 3.9   GFR: Estimated Creatinine Clearance: 86.3 mL/min (A) (by C-G formula based on SCr of 0.38  mg/dL (L)). 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 -Supplemental O2 as needed to maintain O2 saturations 88 to 92% -HFNC/BiPAP, wean as tolerated -Follow intermittent Chest X-ray & ABG as needed  -PRN and Scheduled Bronchodilators  -Antibiotics as below  PATIENT IS AT HIGH RISK FOR INTUBATION IF HE WERE TO UNDERGO SEDATION AND BRONCHOSCOPY, RECOMMEND CHEST PT, AGGRESSIVE PULM TOILET     Sepsis due to Aspiration Pneumonia -IV antibiotics Ceftriaxone AND Azithromycin switch to Unasyn for Aspiration coverage -Gentle IVF hydration as needed   ALS Progressive with complicating features requiring supplemental oxygen/NIV, uses Trilogy at home -Follows with [facility] neurology on Rilutek. At last visit patient was referred to hospice care -Palliative consult for goals of care  Failure to thrive Dysphagia -Patient has PEG in place for nutritional needs.   -Will consult dietary for assistant with tube feeds  Anxiety -started on Ativan 0.5mg  BID PRN  Best practice:  Diet:  Tube Feed  Pain/Anxiety/Delirium protocol (if indicated): No VAP protocol (if indicated): Not indicated DVT prophylaxis: Subcutaneous Heparin GI prophylaxis:  PPI Glucose control:  SSI Yes Central venous access:  N/A Arterial line:  N/A Foley:  N/A Mobility:  bed rest  PT consulted: N/A Last date of multidisciplinary goals of care discussion [9/16] Code Status: DNR/DNI Disposition: ICU    DVT/GI PRX  assessed I Assessed the need for Labs I Assessed the need for Foley I Assessed the need for Central Venous Line Family Discussion when available I Assessed the need for Mobilization I made an Assessment of medications to be adjusted accordingly Safety Risk assessment completed  CASE DISCUSSED IN MULTIDISCIPLINARY ROUNDS WITH ICU TEAM     Critical Care Time devoted to patient care services described in this note is 62 minutes.  Critical care was necessary to treat /prevent imminent and life-threatening deterioration. Overall, patient is critically ill, prognosis is guarded.   Lucie Leather, M.D.  [facility]  Medical Director [facility] Medical Director [facility] Cardio-Pulmonary Department

## 2022-12-02 NOTE — Progress Notes (Signed)
Progress Note   Patient: Donald Gould ZOX:[id number] DOB: Apr 13, 1950 DOA: 12/27/2022     1 DOS: the patient was seen and examined on 12/02/2022     Subjective:  Patient seen and examined in the presence of patient's wife as well as intensivist Patient minimally verbal and only able to communicate by writing on a sheet of paper. A lot of the information was obtained from patient's wife at bedside According to the patient's wife their wishes is DNI DNR. According to patient's wife, neurologist have made him aware never for him to be intubated as he would not be able to come off the ventilator. They have agreed for antibiotic therapy and no bronchoscopy as this will require intubation.   Brief hospital course: From HPI "Donald Gould is a 72 y.o. male  with past medical history of ALS, aphasia, dysphagia, history of aspiration pneumonia, history of heart disease, history of BPH, protein calorie malnutrition severe, small bowel obstruction and earlier part of this year, presenting with shortness of breath and suspicion of aspiration. Initial vitals by EMS show hypoxia with 87% on room air and patient was started on 2 L nasal cannula with O2 sats improving to 94% and initial blood sugar of 199.  In the hospital patient was started on high flow nasal cannula.  O2 sats improving to 96% on 15 L high flow nasal cannula. .. "    Assessment and Plan:  Acute hypoxic respiratory failure secondary to left mainstem bronchus obstruction from mucous and debris According to the patient's wife their wishes is DNI DNR. According to patient's wife neurologist have made him aware never for him to be intubated as he would not be able to come off the ventilator. They have agreed for antibiotic therapy and no bronchoscopy as this will require intubation. Continue high flow oxygen  Continue antibiotic therapy Chest PT as tolerated Continue bronchodilator and nebulizer with hypertonic saline per RT.   Continue antibiotic therapy Pulmonologist on board and case discussed  Aspiration pneumonia Continue management as above Severe sepsis secondary to aspiration pneumonia Patient presented with Tmax of 100.4 in the setting of tachycardia as well as tachypnea up to 42 also with leukocytosis of 12.3 in the setting of respiratory failure with hypoxia Continue current antibiotics as well as above management   ALS (amyotrophic lateral sclerosis) Patient uses walker at baseline Continue PT OT Outpatient follow-up with neurologist at discharge  Coronary artery disease involving native heart valve without angina pectoris No chest pain or Anginal symptoms and EKG non ischemic.   BPH-not on any home medication   Protein calorie malnutrition-ruled out Patient with normal albumin as well as BMI     DVT prophylaxis: Continue heparin   Consults: Pulmonologist on board we appreciate input   Advance Care Planning: DNR/DNI   Family Communication: Discussed with patient's wife present at bedside today    Physical Exam:  Vitals and nursing note reviewed.  Constitutional: In some distress requiring high flow nasal oxygenation HENT: Atraumatic normocephalic Eyes:     General: Lids are normal.     Extraocular Movements: Extraocular movements intact.  Cardiovascular: Heart sounds 1 and 2 present tachycardic Pulmonary: Decreased air entry bilaterally especially at the bases Abdominal:     General: Bowel sounds are normal. There is no distension.     Palpations: Abdomen is soft. There is no mass.     Tenderness: There is no abdominal tenderness.  Musculoskeletal:     Right lower leg: No edema.  Left lower leg: No edema.  Skin:    General: Skin is warm.  Neurological:     Mental Status: He is alert and oriented to person, place, and time.     Cranial Nerves: Cranial nerves 2-12 are intact.  Psychiatric:        Attention and Perception: Attention normal.        Mood and Affect:  Mood normal.        Speech: Speech normal.        Behavior: Behavior normal. Behavior is cooperative.    Data Reviewed: I have reviewed patient's previous records briefly, I have reviewed intensivist documentation, I have reviewed admitting provider documentation, nursing documentation as well as CBC and CMP results as shown below, I have reviewed patient's CT angio of the chest that showed finding of obstruction of the left main bronchus   Vitals:   12/02/22 1156 12/02/22 1200 12/02/22 1300 12/02/22 1450  BP:  118/79 127/73   Pulse:  76 78   Resp:  (!) 21 (!) 28   Temp:  98.7 F (37.1 C)    TempSrc:      SpO2: 99% 98% 99% 100%  Weight:      Height:          Latest Ref Rng & Units 12/02/2022    3:19 AM 12/18/22    3:34 PM 05/15/2022    4:36 AM  CBC  WBC 4.0 - 10.5 K/uL 12.1  12.3  4.1   Hemoglobin 13.0 - 17.0 g/dL 78.4  69.6  29.5   Hematocrit 39.0 - 52.0 % 38.0  40.2  35.0   Platelets 150 - 400 K/uL 172  170  119        Latest Ref Rng & Units 12/02/2022    3:19 AM 12/18/22    3:34 PM 05/16/2022    5:37 AM  CMP  Glucose 70 - 99 mg/dL 284  132  440   BUN 8 - 23 mg/dL 20  24  14    Creatinine 0.61 - 1.24 mg/dL 1.02  7.25  3.66   Sodium 135 - 145 mmol/L 138  139  136   Potassium 3.5 - 5.1 mmol/L 3.8  4.0  3.6   Chloride 98 - 111 mmol/L 101  97  103   CO2 22 - 32 mmol/L 28  30  26    Calcium 8.9 - 10.3 mg/dL 8.9  9.1  8.7   Total Protein 6.5 - 8.1 g/dL 5.8  6.6    Total Bilirubin 0.3 - 1.2 mg/dL 0.9  0.7    Alkaline Phos 38 - 126 U/L 43  48    AST 15 - 41 U/L 24  30    ALT 0 - 44 U/L 24  26      Author: Loyce Dys, MD 12/02/2022 3:35 PM  For on call review [email or web address].

## 2022-12-02 NOTE — Consult Note (Incomplete)
NAME:  Donald Gould, MRN:  [id number], DOB:  01/28/1951, LOS: 0 ADMISSION DATE:  12/16/2022, CONSULTATION DATE:  *** REFERRING MD:  ***, CHIEF COMPLAINT:  ***    HPI  [age] y.o with significant PMH of who presented to the ED with chief complaints of    ED Course: Initial vital signs showed HR of beats/minute, BP mm Hg, the RR 30 breaths/minute, and the oxygen saturation % on and a temperature of 98.92F (36.9C). Patient was lethargic, moaned intermittently, and responded with one-word answers; symmetric movement in the arms and legs was observed. Pertinent Labs/Diagnostics Findings: Na+/ K+:  Glucose: BUN/Cr.:Calcium:  AST/ALT: WBC: Hgb/Hct: Plts:  PCT: negative <0.10  Lactic acid:  COVID PCR: Negative,  troponin:   BNP:   ABG: pO2 ***; pCO2 ***; pH ***;  HCO3 ***, %O2 Sat ***.  CXR> CTH> CTA Chest> CT Abd/pelvis>  Past Medical History  ***  Significant Hospital Events   ***  Consults:  ***  Procedures:  ***  Significant Diagnostic Tests:  : Chest Xray> : Abdominal xray> : Noncontrast CT head> : CTA Chest, abdomen and pelvis>  Interim History / Subjective:      Micro Data:  : SARS-CoV-2 PCR> negative : Influenza PCR> negative : Blood culture x2> : Urine Culture> : MRSA PCR>>  : Strep pneumo urinary antigen> : Legionella urinary antigen> : Mycoplasma pneumonia>  Antimicrobials:  Vancomycin Cefepime Azithromycin Ceftriaxone Metronidazole  OBJECTIVE  Blood pressure (!) 149/84, pulse 87, temperature 99 F (37.2 C), temperature source Axillary, resp. rate (!) 39, height 6\' 3"  (1.905 m), weight 75.3 kg, SpO2 93%.        Intake/Output Summary (Last 24 hours) at 12/05/2022 2349 Last data filed at 11/20/2022 2323 Gross per 24 hour  Intake 200 ml  Output --  Net 200 ml   Filed Weights   11/26/2022 1456  Weight: 75.3 kg     Physical Examination  GENERAL:  year-old critically ill patient lying in the bed  EYES: PEERLA. No scleral icterus.  Extraocular muscles intact.  HEENT: Head atraumatic, normocephalic. Oropharynx and nasopharynx clear.  NECK:  No JVD, supple  LUNGS: Normal breath sounds bilaterally.  No use of accessory muscles of respiration.  CARDIOVASCULAR: S1, S2 normal. No murmurs, rubs, or gallops.  ABDOMEN: Soft, NTND EXTREMITIES: No swelling or erythema.  Capillary refill < 3 seconds in all extremities. Pulses palpable distally. NEUROLOGIC: The patient is . No new or focal neurological deficit appreciated. Cranial nerves are intact.  SKIN: No obvious rash, lesion, or ulcer. Warm to touch Labs/imaging that I {ACTIONS; HAVE/HAVE NOT:19434}personally reviewed  (right click and "Reselect all SmartList Selections" daily)     Labs   CBC: Recent Labs  Lab 12/07/2022 1534  WBC 12.3*  HGB 13.0  HCT 40.2  MCV 92.2  PLT 170    Basic Metabolic Panel: Recent Labs  Lab 11/18/2022 1534  NA 139  K 4.0  CL 97*  CO2 30  GLUCOSE 111*  BUN 24*  CREATININE 0.49*  CALCIUM 9.1  MG 2.2  PHOS 4.0   GFR: Estimated Creatinine Clearance: 86.3 mL/min (A) (by C-G formula based on SCr of 0.49 mg/dL (L)). Recent Labs  Lab 12/03/2022 1534 11/28/2022 1849  PROCALCITON <0.10  --   WBC 12.3*  --   LATICACIDVEN  --  0.8    Liver Function Tests: Recent Labs  Lab 11/17/2022 1534  AST 30  ALT 26  ALKPHOS 48  BILITOT 0.7  PROT 6.6  ALBUMIN 3.6  No results for input(s): "LIPASE", "AMYLASE" in the last 168 hours. No results for input(s): "AMMONIA" in the last 168 hours.  ABG No results found for: "PHART", "PCO2ART", "PO2ART", "HCO3", "TCO2", "ACIDBASEDEF", "O2SAT"   Coagulation Profile: No results for input(s): "INR", "PROTIME" in the last 168 hours.  Cardiac Enzymes: No results for input(s): "CKTOTAL", "CKMB", "CKMBINDEX", "TROPONINI" in the last 168 hours.  HbA1C: No results found for: "HGBA1C"  CBG: Recent Labs  Lab 12/05/2022 2305  GLUCAP 109*    Review of Systems:   ***  Past Medical History  He,  has  a past medical history of ALS (amyotrophic lateral sclerosis) (HCC), Anemia, unspecified (09/14/2022), and Environmental allergies.   Surgical History    Past Surgical History:  Procedure Laterality Date  . CATARACT EXTRACTION W/PHACO Right 07/02/2021   Procedure: CATARACT EXTRACTION PHACO AND INTRAOCULAR LENS PLACEMENT (IOC) RIGHT;  Surgeon: Lockie Mola, MD;  Location: [facility];  Service: Ophthalmology;  Laterality: Right;  11.84 1:15.6  . RETINAL DETACHMENT SURGERY Left 01/10/2021   [facility]     Social History   reports that he quit smoking about 34 years ago. His smoking use included cigarettes. He has never used smokeless tobacco. He reports current alcohol use of about 11.0 standard drinks of alcohol per week.   Family History   His family history is not on file.   Allergies Allergies  Allergen Reactions  . Other Other (See Comments)    Hip pain  Muscle and joint issues.  Joint pain    Hip pain  Muscle and joint issues.  . Statins Rash    Joint pain     Home Medications  Prior to Admission medications   Medication Sig Start Date End Date Taking? Authorizing Provider  NUEDEXTA 20-10 MG capsule Take 1 capsule by mouth 2 (two) times daily. 06/25/22  Yes [provider]  cetirizine HCl (EQ ALLERG RELIEF CHILD, CETIR,) 5 MG/5ML SOLN Place 10 mLs (10 mg total) into feeding tube daily. 05/16/22   Almon Hercules, MD  ciprofloxacin (CILOXAN) 0.3 % ophthalmic solution Place 2 drops into both eyes 2 (two) times daily. 11/27/22   [provider]  ezetimibe (ZETIA) 10 MG tablet Take 10 mg by mouth daily. 09/07/22   [provider]  fluticasone (FLONASE) 50 MCG/ACT nasal spray Place 2 sprays into both nostrils daily. 11/19/22   [provider]  glycopyrrolate (ROBINUL) 1 MG tablet Take 2 mg by mouth 3 (three) times daily.    [provider]  hyoscyamine (LEVSIN) 0.125 MG tablet Take 0.125 mg by mouth every 4 (four) hours as needed  (secretions).    [provider]  LORazepam (ATIVAN) 0.5 MG tablet PLEASE SEE ATTACHED FOR DETAILED DIRECTIONS 11/08/22   [provider]  riluzole (RILUTEK) 50 MG tablet Place 1 tablet (50 mg total) into feeding tube every 12 (twelve) hours. 05/16/22   Almon Hercules, MD  Water For Irrigation, Sterile (FREE WATER) SOLN Place 150 mLs into feeding tube 5 (five) times daily. 05/16/22   Almon Hercules, MD      Active Hospital Problem list   See systems below  Assessment & Plan:   Pulmonary #Acute Hypoxic Respiratory Failure #Aspiration Pneumonia Hx of ALS     ID Sepsis with/***without septic shock due to suspected *** Lactic: ***, Baseline PCT: ***, UA: ***, CXR: ***, CT: ***  Initial interventions/workup included: *** L of NS/LR*** & Cefepime/ Vancomycin/ Metronidazole*** meets SIRS criteria: Heart Rate 102 beats/minute, Respiratory Rate  36 breaths/minute,Temperature 104.6, Shock Index (SI) -Supplemental oxygen as needed, to maintain SpO2 > 90% -/u cultures, trend lactic/ PCT -Monitor WBC/ fever curve -IV antibiotics: cefepime (HCAP/UTI)*** & vancomycin *** ceftriaxone (CAP) *** zosyn (intra-abdominal) *** unasyn (aspiration PNA) -IVF hydration as needed: *** -Repeat blood cultures until clear; follow up trach aspirate -Pressors for MAP goal >65 -Strict I/O's     Best practice:  Diet:  {ZOXW:96045} Pain/Anxiety/Delirium protocol (if indicated): {Pain/Anxiety/Delirium:26941} VAP protocol (if indicated): {VAP:29640} DVT prophylaxis: {DVT Prophylaxis:26933} GI prophylaxis: {GI:26934} Glucose control:  {Glucose Control:26935} Central venous access:  {Central Venous Access:26936} Arterial line:  {Central Venous Access:26936} Foley:  {Central Venous Access:26936} Mobility:  {Mobility:26937}  PT consulted: {PT Consult:26938} Last date of multidisciplinary goals of care discussion [***] Code Status:  {Code Status:26939} Disposition: ***   = Goals of Care = Code  Status Order: @CODE @   Primary Emergency Contact: Sennett,sandy Wishes to pursue full aggressive treatment and intervention options, including CPR and intubation, but goals of care will be addressed on going with family if that should become necessary.  Critical care time: 45 minutes        Webb Silversmith DNP, CCRN, FNP-C, AGACNP-BC Acute Care & Family Nurse Practitioner [facility] PCCM on call pager [phone]

## 2022-12-02 NOTE — Consult Note (Signed)
Consultation Note Date: 12/02/2022   Patient Name: Donald Gould  DOB: [birth year]  MRN: [id number]  Age / Sex: 72 y.o., male  PCP: Kandyce Rud, MD Referring Physician: Loyce Dys, MD  Reason for Consultation: Establishing goals of care  HPI/Patient Profile: 72 y.o. male  with past medical history of ALS, aphasia, dysphagia, aspiration, BPH, malnutrition, s/p PEG, SBO admitted on Dec 04, 2022 with shortness of breath with aspiration pneumonia in the setting of progressing ALS.   Clinical Assessment and Goals of Care: Consult received and chart review completed. I met today with [name] and wife, [name]. [name] is awake and alert, He nods head yes/no appropriately and seems at least somewhat understanding of conversation. We review his progression with ALS and significant decline over the past months. Wayne loved to golf and last was golfing in May this year. He has had much decline since then. [name] shares that they have never really had specific goals of care discussions. She reports that his neurologist recommended against intubation but she is not sure if [name] does not want this. I did speak directly to [place]. I asked [name] if he would want to be connected to life support/intubated and he nods head no. I asked if he was scared and he nods yes. I asked if he was scared of suffering, pain, shortness of breath and he nods yes. I reassured him that we have many medications and ways to provide him with relief of symptoms and suffering - even thought these do not improve his condition. I asked him if he is scared to die and he nods no. He does become tearful. I reassure him that none of [place] know our time but regardless of what comes next we will support and care for him.   I spoke more with [place]. [name] confirms that this is new information to her. She expresses to me that she does not want him to suffer - he has  suffered. She is struggling to care for him at home. She asks about him coming home and anticipating what care. She will reach out to find out about increasing their caregiver support at home most likely for 24/7. She will request increased support from hospice he receives through [facility]. We will reassess his progress tomorrow.   We agree to continue current conservative care. Low threshold to consider comfort care if he declines. Forego chest PT if poorly tolerated. PRN medications added to ensure comfort.   All questions/concerns addressed. Emotional support provided. Updated medical team.   Primary Decision Maker PATIENT with wife    SUMMARY OF RECOMMENDATIONS   - DNR/DNI - Conservative treatment - Consideration of comfort if decline - Minimize suffering  Code Status/Advance Care Planning: DNR   Symptom Management:  Restarted BID lorazepam. PRN medication added.   Prognosis:  Prognosis poor with advancing ALS and aspiration pneumonia.   Discharge Planning: Home with Hospice      Primary Diagnoses: Present on Admission:  ALS (amyotrophic lateral sclerosis) (HCC)  Coronary artery disease involving native heart without  angina pectoris  BPH (benign prostatic hyperplasia)  Protein-calorie malnutrition, severe  Severe sepsis (HCC)  Acute respiratory failure with hypoxia (HCC)  Aspiration pneumonia (HCC)   I have reviewed the medical record, interviewed the patient and family, and examined the patient. The following aspects are pertinent.  Past Medical History:  Diagnosis Date   ALS (amyotrophic lateral sclerosis) (HCC)    Anemia, unspecified 09/14/2022   Environmental allergies    Social History   Socioeconomic History   Marital status: Married    Spouse name: Wlliam Pasini   Number of children: Not on file   Years of education: Not on file   Highest education level: Not on file  Occupational History   Not on file  Tobacco Use   Smoking status: Former     Current packs/day: 0.00    Types: Cigarettes    Quit date: 1990    Years since quitting: 34.7   Smokeless tobacco: Never  Vaping Use   Vaping status: Never Used  Substance and Sexual Activity   Alcohol use: Yes    Alcohol/week: 11.0 standard drinks of alcohol    Types: 11 Standard drinks or equivalent per week    Comment: 1.5 shots/day   Drug use: Not on file   Sexual activity: Not on file  Other Topics Concern   Not on file  Social History Narrative   Not on file   Social Determinants of Health   Financial Resource Strain: Patient Declined (01/17/2022)   [facility] from [facility], [facility]   Overall Financial Resource Strain (CARDIA)    Difficulty of Paying Living Expenses: Patient declined  Food Insecurity: Patient Unable To Answer (12/02/2022)   Hunger Vital Sign    Worried About Running Out of Food in the Last Year: Patient unable to answer    Ran Out of Food in the Last Year: Patient unable to answer  Transportation Needs: Patient Unable To Answer (12/02/2022)   PRAPARE - Transportation    Lack of Transportation (Medical): Patient unable to answer    Lack of Transportation (Non-Medical): Patient unable to answer  Physical Activity: Not on file  Stress: Not on file  Social Connections: Unknown (07/29/2021)   Received from [facility], [facility]   Social Network    Social Network: Not on file   History reviewed. No pertinent family history. Scheduled Meds:  budesonide (PULMICORT) nebulizer solution  0.5 mg Nebulization BID   Chlorhexidine Gluconate Cloth  6 each Topical Daily   glycopyrrolate  2 mg Per Tube TID   heparin  5,000 Units Subcutaneous Q8H   ipratropium-albuterol  3 mL Nebulization Q4H   pantoprazole (PROTONIX) IV  40 mg Intravenous Q12H   riluzole  50 mg Per Tube Q12H   Continuous Infusions:  ampicillin-sulbactam (UNASYN) IV 200 mL/hr at 12/02/22 0700   lactated ringers Stopped (12/02/22 0645)   PRN  Meds:.acetaminophen **OR** acetaminophen, albuterol, guaiFENesin, hyoscyamine, morphine injection, mouth rinse Allergies  Allergen Reactions   Other Other (See Comments)    Hip pain  Muscle and joint issues.  Joint pain    Hip pain  Muscle and joint issues.   Statins Rash    Joint pain   Review of Systems  Unable to perform ROS: Acuity of condition    Physical Exam Vitals and nursing note reviewed.  Constitutional:      Appearance: He is ill-appearing.     Comments: Thin, frail  Cardiovascular:     Rate and Rhythm: Normal  rate.  Pulmonary:     Effort: No tachypnea, accessory muscle usage or respiratory distress.     Comments: Intermittent wheezing; increased efforts Abdominal:     General: Abdomen is flat.     Palpations: Abdomen is soft.  Neurological:     Mental Status: He is alert.     Comments: Evidence of confusion at times; he answers questions seemingly appropriate but cannot know for sure if he fully understands     Vital Signs: BP 121/76   Pulse 76   Temp 97.8 F (36.6 C)   Resp (!) 26   Ht 6\' 3"  (1.905 m)   Wt 75.3 kg   SpO2 99%   BMI 20.75 kg/m  Pain Scale: CPOT   Pain Score: 0-No pain   SpO2: SpO2: 99 % O2 Device:SpO2: 99 % O2 Flow Rate: .O2 Flow Rate (L/min): (S) 10 L/min  IO: Intake/output summary:  Intake/Output Summary (Last 24 hours) at 12/02/2022 1024 Last data filed at 12/02/2022 0700 Gross per 24 hour  Intake 741.63 ml  Output 250 ml  Net 491.63 ml    LBM:   Baseline Weight: Weight: 75.3 kg Most recent weight: Weight: 75.3 kg     Palliative Assessment/Data:    Time Total: 85 min  Greater than 50%  of this time was spent counseling and coordinating care related to the above assessment and plan.  Signed by: Yong Channel, NP Palliative Medicine Team Pager # [phone] (M-F 8a-5p) Team Phone # [phone] (Nights/Weekends)

## 2022-12-02 NOTE — IPAL (Signed)
  MOST FORM

## 2022-12-02 NOTE — Consult Note (Signed)
PHARMACY - PHYSICIAN COMMUNICATION CRITICAL VALUE ALERT - BLOOD CULTURE IDENTIFICATION (BCID)  Donald Gould is an 72 y.o. male who presented to [facility] on 11/30/2022 with a chief complaint of near syncope.   Assessment: 1/4 GNR, BCID not able to detect pathogen  Name of physician (or Provider) Contacted: Dr. Meriam Sprague  Current antibiotics: Unasyn  Changes to prescribed antibiotics recommended:  Zosyn - Recommendations accepted by provider  Results for orders placed or performed during the hospital encounter of 12/05/2022  Blood Culture ID Panel (Reflexed) (Collected: 12/03/2022  6:49 PM)  Result Value Ref Range   Enterococcus faecalis NOT DETECTED NOT DETECTED   Enterococcus Faecium NOT DETECTED NOT DETECTED   Listeria monocytogenes NOT DETECTED NOT DETECTED   Staphylococcus species NOT DETECTED NOT DETECTED   Staphylococcus aureus (BCID) NOT DETECTED NOT DETECTED   Staphylococcus epidermidis NOT DETECTED NOT DETECTED   Staphylococcus lugdunensis NOT DETECTED NOT DETECTED   Streptococcus species NOT DETECTED NOT DETECTED   Streptococcus agalactiae NOT DETECTED NOT DETECTED   Streptococcus pneumoniae NOT DETECTED NOT DETECTED   Streptococcus pyogenes NOT DETECTED NOT DETECTED   A.calcoaceticus-baumannii NOT DETECTED NOT DETECTED   Bacteroides fragilis NOT DETECTED NOT DETECTED   Enterobacterales NOT DETECTED NOT DETECTED   Enterobacter cloacae complex NOT DETECTED NOT DETECTED   Escherichia coli NOT DETECTED NOT DETECTED   Klebsiella aerogenes NOT DETECTED NOT DETECTED   Klebsiella oxytoca NOT DETECTED NOT DETECTED   Klebsiella pneumoniae NOT DETECTED NOT DETECTED   Proteus species NOT DETECTED NOT DETECTED   Salmonella species NOT DETECTED NOT DETECTED   Serratia marcescens NOT DETECTED NOT DETECTED   Haemophilus influenzae NOT DETECTED NOT DETECTED   Neisseria meningitidis NOT DETECTED NOT DETECTED   Pseudomonas aeruginosa NOT DETECTED NOT DETECTED   Stenotrophomonas  maltophilia NOT DETECTED NOT DETECTED   Candida albicans NOT DETECTED NOT DETECTED   Candida auris NOT DETECTED NOT DETECTED   Candida glabrata NOT DETECTED NOT DETECTED   Candida krusei NOT DETECTED NOT DETECTED   Candida parapsilosis NOT DETECTED NOT DETECTED   Candida tropicalis NOT DETECTED NOT DETECTED   Cryptococcus neoformans/gattii NOT DETECTED NOT DETECTED   Littie Deeds, PharmD PGY1 Pharmacy Resident 12/02/2022  4:17 PM

## 2022-12-02 NOTE — Progress Notes (Signed)
Initial Nutrition Assessment  DOCUMENTATION CODES:   Severe malnutrition in context of chronic illness  INTERVENTION:   -TF via PEG:   650 ml [employer] (2 cartons ) TID  240 ml free water with each feeding administration  Tube feeding regimen provides 2730 kcal (100% of needs), 120 grams of protein, and 1404 ml of H2O. Total free water: 2124 ml daily   NUTRITION DIAGNOSIS:   Severe Malnutrition related to chronic illness (ALS) as evidenced by severe fat depletion, severe muscle depletion.  GOAL:   Patient will meet greater than or equal to 90% of their needs  MONITOR:   TF tolerance  REASON FOR ASSESSMENT:   Consult Enteral/tube feeding initiation and management  ASSESSMENT:   Pt with significant PMH of ALS (amyotrophic lateral sclerosis), thrombocytopenia, Chronic Anemia, unintentional  weight loss, dysphagia  w/recurrent aspiration and Aspiration pneumonia s/p PEG placement, BPH, HLD, who presented with chief complaints of near syncopal episode and possible aspiration.  Pt admitted with with near syncope and possible aspiration.   Reviewed I/O's: +492 ml x 24 hours  UOP: 250 ml x 24 hours  Case discussed with RN, MD, and during ICU rounds. Pt is a DNR/ DNI. He is NPO and is dependent on PEG for sole source nutrition. MD concerned about weight loss and requesting RD recommendations.   Case discussed with RN, who reports pt is feeling hungry and confirmed ability to re-start TF today. Pt reports he is hungry.   Spoke with pt and wife at bedside. Pt is non-verbal but able to communicate by writing. Pt wife confirms that several standard TF formulas have been tried, but pt experienced diarrhea with them (pt also has had history of small bowel obstructions with may have been contributing to this). Pt has been on Molli Posey since last hospitalization on 04/2022. Pt still has some diarrhea after feedings, but wife noticed improvement in comparison to previous formulas,  however, pt unable to get to the bathroom quickly (he is still able to ambulate with a walker).   Wife confirms home TF regimen of 5 cartons of [employer] daily. She flushes with one carton of water after each feeding administration. Pt receives feedings 3 times per with feeding pump (2 cartons in AM, 1 carton in the afternoon, 2 at night) and tolerates this well. Pt wife discussed with home health RD ([facility]) about increasing TF formula due to weight loss, but pt is fixated on only using 5 cartons daily. RD discussed concerns of weight loss and frailty. Pt and wife agreeable to increase to 6 cartons daily for additional nutrition. Pt and wife would like to continue with Molli Posey formula ("if it's not broke, don't fix it"). RD agrees with continuing with home formula.   Reviewed wt hx; wt has been stable for 3 months, however, suspect recent wt is a stated wt given similar previous wt readings. Pt wife has noticed wt loss but unsure how much weight he has lot of UBW.   Palliative care following for goals of care discussions. Per TOC notes, plan to pursue SNF placement.   Medications reviewed and include unasyn and lactated ringers infusion @ 50 ml/hr.   No results found for: "HGBA1C" PTA DM medications are none.   Labs reviewed: CBGS: 101-109 (inpatient orders for glycemic control are none).    NUTRITION - FOCUSED PHYSICAL EXAM:  Flowsheet Row Most Recent Value  Orbital Region Severe depletion  Upper Arm Region Severe depletion  Thoracic and Lumbar Region Severe  depletion  Buccal Region Severe depletion  Temple Region Severe depletion  Clavicle Bone Region Severe depletion  Clavicle and Acromion Bone Region Severe depletion  Scapular Bone Region Severe depletion  Dorsal Hand Severe depletion  Patellar Region Severe depletion  Anterior Thigh Region Severe depletion  Posterior Calf Region Severe depletion  Edema (RD Assessment) None  Hair Reviewed  Eyes Reviewed  Mouth Reviewed   Skin Reviewed  Nails Reviewed       Diet Order:   Diet Order             Diet NPO time specified  Diet effective now                   EDUCATION NEEDS:   Education needs have been addressed  Skin:  Skin Assessment: Reviewed RN Assessment  Last BM:  Unknown  Height:   Ht Readings from Last 1 Encounters:  12/06/2022 6\' 3"  (1.905 m)    Weight:   Wt Readings from Last 1 Encounters:  12/06/2022 75.3 kg    Ideal Body Weight:  89.1 kg  BMI:  Body mass index is 20.75 kg/m.  Estimated Nutritional Needs:   Kcal:  1610-9604  Protein:  115-130 grams  Fluid:  > 2 L    Levada Schilling, RD, LDN, CDCES Registered Dietitian II Certified Diabetes Care and Education Specialist Please refer to [facility] for RD and/or RD on-call/weekend/after hours pager

## 2022-12-02 NOTE — IPAL (Signed)
  Interdisciplinary Goals of Care Family Meeting   Date carried out: 12/02/2022  Location of the meeting: Bedside  Member's involved: Physician, Bedside Registered Nurse, and Family Member or next of kin   GOALS OF CARE DISCUSSION  The Clinical status was relayed to family in detail- Wife at Bedside  Updated and notified of patients medical condition- Patient with increased WOB  Is NOT ABLE TO SPEAK ALERT AND AWAKE FOLLOWS COMMANDS, CAN UNDERSTAND  Explained to family course of therapy and the modalities   Patient with Progressive multiorgan failure with a very high probablity of a very minimal chance of meaningful recovery despite all aggressive and optimal medical therapy.   MOST FORM REVIEWED WITH WIFE AND PATIENT DNR/DNI WOULD NOT WANT VENTILATOR  HIGH RISK FOR DECOMPENSATION END STAGE ALS, PROGRESSIVE WORSENING NEEDS NUTRITIONAL SUPPORT   Family are satisfied with Plan of action and management. All questions answered   PROGNOSIS IS GRAVE AND VERY LOW CHANCE OF MEANINGFUL RECOVERY   Additional CC time 35 mins   Donald Gould Santiago Glad, M.D.  [facility]  Medical Director [facility] Medical Director [facility] Cardio-Pulmonary Department

## 2022-12-03 DIAGNOSIS — Z7189 Other specified counseling: Secondary | ICD-10-CM | POA: Diagnosis not present

## 2022-12-03 DIAGNOSIS — Z515 Encounter for palliative care: Secondary | ICD-10-CM | POA: Diagnosis not present

## 2022-12-03 DIAGNOSIS — J9601 Acute respiratory failure with hypoxia: Secondary | ICD-10-CM | POA: Diagnosis not present

## 2022-12-03 DIAGNOSIS — G1221 Amyotrophic lateral sclerosis: Secondary | ICD-10-CM | POA: Diagnosis not present

## 2022-12-03 LAB — RENAL FUNCTION PANEL
Albumin: 3.2 g/dL — ABNORMAL LOW (ref 3.5–5.0)
Anion gap: 9 (ref 5–15)
BUN: 23 mg/dL (ref 8–23)
CO2: 32 mmol/L (ref 22–32)
Calcium: 8.8 mg/dL — ABNORMAL LOW (ref 8.9–10.3)
Chloride: 98 mmol/L (ref 98–111)
Creatinine, Ser: 0.49 mg/dL — ABNORMAL LOW (ref 0.61–1.24)
GFR, Estimated: >60 mL/min (ref >60)
Glucose, Bld: 115 mg/dL — ABNORMAL HIGH (ref 70–99)
Phosphorus: 3.2 mg/dL (ref 2.5–4.6)
Potassium: 3.9 mmol/L (ref 3.5–5.1)
Sodium: 139 mmol/L (ref 135–145)

## 2022-12-03 LAB — GLUCOSE, CAPILLARY
Glucose-Capillary: 106 mg/dL — ABNORMAL HIGH (ref 70–99)
Glucose-Capillary: 115 mg/dL — ABNORMAL HIGH (ref 70–99)
Glucose-Capillary: 123 mg/dL — ABNORMAL HIGH (ref 70–99)
Glucose-Capillary: 129 mg/dL — ABNORMAL HIGH (ref 70–99)
Glucose-Capillary: 169 mg/dL — ABNORMAL HIGH (ref 70–99)
Glucose-Capillary: 170 mg/dL — ABNORMAL HIGH (ref 70–99)
Glucose-Capillary: 179 mg/dL — ABNORMAL HIGH (ref 70–99)

## 2022-12-03 LAB — CBC
HCT: 36.8 % — ABNORMAL LOW (ref 39.0–52.0)
Hemoglobin: 11.9 g/dL — ABNORMAL LOW (ref 13.0–17.0)
MCH: 29.8 pg (ref 26.0–34.0)
MCHC: 32.3 g/dL (ref 30.0–36.0)
MCV: 92 fL (ref 80.0–100.0)
Platelets: 169 10*3/uL (ref 150–400)
RBC: 4 MIL/uL — ABNORMAL LOW (ref 4.22–5.81)
RDW: 13.2 % (ref 11.5–15.5)
WBC: 14.6 10*3/uL — ABNORMAL HIGH (ref 4.0–10.5)
nRBC: 0 % (ref 0.0–0.2)

## 2022-12-03 MED ORDER — KATE FARMS STANDARD 1.4 PO LIQD
650.0000 mL | Freq: Three times a day (TID) | ORAL | Status: DC
  Administered 2022-12-03 – 2022-12-04 (×2): 650 mL
  Filled 2022-12-03: qty 650

## 2022-12-03 MED ORDER — ACETAMINOPHEN 650 MG RE SUPP: 650.0000 mg | Freq: Four times a day (QID) | RECTAL | Status: DC | PRN

## 2022-12-03 MED ORDER — ACETAMINOPHEN 325 MG PO TABS
650.0000 mg | ORAL_TABLET | Freq: Four times a day (QID) | ORAL | Status: DC | PRN
  Administered 2022-12-03: 650 mg
  Filled 2022-12-03: qty 2

## 2022-12-03 MED ORDER — LORAZEPAM 1 MG PO TABS
0.5000 mg | ORAL_TABLET | Freq: Every day | ORAL | Status: DC | PRN
  Administered 2022-12-04: 0.5 mg
  Filled 2022-12-03 (×2): qty 1

## 2022-12-03 NOTE — Progress Notes (Addendum)
0800 Patient request to get up in chair. Assisted from bed to chair with moderate help. Patient looks more alert and is more interactive this morning. Calmer demeaner.  0830 No Jae [employer] tube feeding available on the floor. Dietary called to get more. 0900 Dietary re-called for tube feeding. 1023 Finally received tube feeding, given as ordered for 0800.  1100 Patient request to stay up in chair. 1200 Sleeping in chair. 1400 Placed back in bed. Very steady gait with walker.

## 2022-12-03 NOTE — Consult Note (Signed)
NAME:  Donald Gould, MRN:  [id number], DOB:  Nov 30, 1950, LOS: 2 ADMISSION DATE:  11/17/2022, CONSULTATION DATE:  11/28/2022 REFERRING MD:  Earnie Larsson  CHIEF COMPLAINT:  Acute Respiratory Failure    HPI  72 y.o male with significant PMH of ALS (amyotrophic lateral sclerosis), thrombocytopenia, Chronic Anemia, unintentional  weight loss, dysphagia  w/recurrent aspiration and Aspiration pneumonia s/p PEG placement, BPH, HLD, who presented to the ED with chief complaints of near syncopal episode and possible aspiration.   ED Course: Initial vital signs showed HR of 92 beats/minute, BP 142/80 mm Hg, the RR 31 breaths/minute, and the oxygen saturation 92% on [place] and a temperature of 98.62F (36.7C). Pertinent Labs/Diagnostics Findings: Na+/ K+:139/4.0  Glucose: 111 BUN/Cr.:24/0.49 WBC:12.3  PCT: negative <0.10  Lactic acid: 0.8 COVID PCR: Negative,  troponin:20  CTA Chest> Aspirated debris within the trachea and occluding the left mainstem bronchus and multiple left lingular and lower lobe bronchi.  Patient was treated in the ED with DuoNebs and hypertonic saline nebulizers.  Was started on broad-spectrum antibiotic for aspiration pneumonia and admitted to hospitalist service.  PCCM consulted to evaluate for possible bronchoscopy and high risk for intubation, Discussed with Wife and MOST from reviewed, patient at high risk for vent support if to perform bronch and giving sedation   Past Medical History  ALS (amyotrophic lateral sclerosis), thrombocytopenia, Chronic Anemia, unintentional  weight loss, dysphagia  w/recurrent aspiration and Aspiration pneumonia s/p PEG placement, BPH, HLD,  Significant Hospital Events   9/15: Admitted to ICU with acute hypoxic respiratory failure in the setting of aspiration pneumonia and progressive Neuromuscular lung disease  9/16 resp distress, wean off biPAP as tolerated 9/17 FiO2 (%):  [50 %] 50 %    Consults:  PCCM  Procedures:   None  Significant Diagnostic Tests:  12/14/2022: Chest Xray> IMPRESSION: Mild peripheral increased septal markings suggest mild interstitial edema. No airspace opacities or atelectasis identified.  11/29/2022: CTA Chest > IMPRESSION: Aspirated debris within the trachea and occluding the left mainstem bronchus and multiple left lingular and lower lobe bronchi.  Interim History / Subjective:    Remains SOB Ill appearing cachexia Poor resp effort On fio2 50% salter high flow   Micro Data:  9/15: SARS-CoV-2 PCR> negative 9/15: Influenza PCR> negative 9/15: Blood culture x2> December 19, 2022: MRSA PCR>>   Antimicrobials:  Azithromycin 9/15 x 1 Ceftriaxone 9/15 x 1 Unasyn 9/16>  OBJECTIVE  Blood pressure (!) 104/58, pulse 73, temperature 100.2 F (37.9 C), temperature source Oral, resp. rate (!) 29, height 6\' 3"  (1.905 m), weight 74.2 kg, SpO2 100%.    FiO2 (%):  [50 %] 50 %   Intake/Output Summary (Last 24 hours) at 12/03/2022 0717 Last data filed at 12/03/2022 0600 Gross per 24 hour  Intake 1155.08 ml  Output 500 ml  Net 655.08 ml   Filed Weights   12/11/2022 1456 12/03/22 0444  Weight: 75.3 kg 74.2 kg        Review of Systems: Non verbal Other:  All other systems negative   Physical Examination:   General Appearance: No distress  EYES PERRLA, EOM intact.   NECK Supple, No JVD Pulmonary: normal breath sounds, No wheezing.  CardiovascularNormal S1,S2.  No m/r/g.   Abdomen: Benign, Soft, non-tender. Neurology UE/LE 3/5 strength, no focal deficits Ext pulses intact, cap refill intact ALL OTHER ROS ARE NEGATIVE        Labs/imaging that I havepersonally reviewed  (right click and "Reselect all SmartList Selections" daily)  Labs   CBC: Recent Labs  Lab 11/27/2022 1534 12/02/22 0319 12/03/22 0412  WBC 12.3* 12.1* 14.6*  HGB 13.0 12.2* 11.9*  HCT 40.2 38.0* 36.8*  MCV 92.2 91.6 92.0  PLT 170 172 169    Basic Metabolic Panel: Recent Labs  Lab  12/16/2022 1534 12/02/22 0319 12/03/22 0412  NA 139 138 139  K 4.0 3.8 3.9  CL 97* 101 98  CO2 30 28 32  GLUCOSE 111* 115* 115*  BUN 24* 20 23  CREATININE 0.49* 0.38* 0.49*  CALCIUM 9.1 8.9 8.8*  MG 2.2 2.3  --   PHOS 4.0 3.9 3.2   GFR: Estimated Creatinine Clearance: 85 mL/min (A) (by C-G formula based on SCr of 0.49 mg/dL (L)). Recent Labs  Lab 11/20/2022 1534 12/12/2022 1849 11/19/2022 2353 12/02/22 0319 12/03/22 0412  PROCALCITON <0.10  --   --   --   --   WBC 12.3*  --   --  12.1* 14.6*  LATICACIDVEN  --  0.8 0.8  --   --     Liver Function Tests: Recent Labs  Lab 12/15/2022 1534 12/02/22 0319 12/03/22 0412  AST 30 24  --   ALT 26 24  --   ALKPHOS 48 43  --   BILITOT 0.7 0.9  --   PROT 6.6 5.8*  --   ALBUMIN 3.6 3.5 3.2*  CBG: Recent Labs  Lab 12/02/22 1201 12/02/22 1512 12/02/22 1924 12/03/22 0104 12/03/22 0401  GLUCAP 99 117* 113* 123* 106*    Assessment & Plan:   Acute Hypoxic Respiratory Failure due to Aspiration Pneumonia  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 Severe ACUTE Hypoxic and Hypercapnic Respiratory Failure Oxygen as needed BD therapy Chest PT as tolerated   ASPIRATION PNEUMONIA Sepsis due to Aspiration Pneumonia -Antibiotics as below -switch to Unasyn for Aspiration coverage -Gentle IVF hydration as needed  PATIENT IS AT HIGH RISK FOR INTUBATION IF HE WERE TO UNDERGO SEDATION AND BRONCHOSCOPY, RECOMMEND CHEST PT, AGGRESSIVE PULM TOILET   ALS END STAGE Progressive with complicating features requiring supplemental oxygen/NIV, uses Trilogy at home -[facility]s [facility] neurology on [place]. At last visit patient was referred to hospice care -Palliative consult for goals of care  Failure to  thrive Dysphagia -Patient has PEG in place for nutritional needs.   -Will consult dietary for assistant with tube feeds  Anxiety -started on Ativan 0.5mg  BID PRN   ENDO - ICU hypoglycemic\Hyperglycemia protocol -check FSBS per protocol   GI GI PROPHYLAXIS as indicated  NUTRITIONAL STATUS DIET-->TF's as tolerated Constipation protocol as indicated   ELECTROLYTES -follow labs as needed -replace as needed -pharmacy consultation and following   Best practice:  Diet:  Tube Feed  Pain/Anxiety/Delirium protocol (if indicated): No VAP protocol (if indicated): Not indicated DVT prophylaxis: Subcutaneous Heparin GI prophylaxis: PPI Glucose control:  SSI Yes Central venous access:  N/A Arterial line:  N/A Foley:  N/A Mobility:  bed rest  PT consulted: N/A Last date of multidisciplinary goals of care discussion [9/16] Code Status: DNR/DNI Disposition: ICU     DVT/GI PRX  assessed I Assessed the need for Labs I Assessed the need for Foley I Assessed the need for Central Venous Line Family Discussion when available I Assessed the need for Mobilization I made an Assessment of medications to  be adjusted accordingly Safety Risk assessment completed  CASE DISCUSSED IN MULTIDISCIPLINARY ROUNDS WITH ICU TEAM     Critical Care Time devoted to patient care services described in this note is 45 minutes.  Critical care was necessary to treat /prevent imminent and life-threatening deterioration. Overall, patient is critically ill, prognosis is guarded.    Lucie Leather, M.D.  [facility]  Medical Director [facility] Medical Director [facility] Cardio-Pulmonary Department

## 2022-12-03 NOTE — Progress Notes (Signed)
Progress Note   Patient: Donald Gould ZOX:[id number] DOB: 29-Aug-1950 DOA: 11/29/2022     2 DOS: the patient was seen and examined on 12/03/2022   Subjective:  Patient seen and examined at bedside this morning Patient minimally verbal and only able to communicate by writing on a sheet of paper. Patient unable to give subjective information on account of being minimally verbal Pulmonologist on board     Brief hospital course: From HPI "Lucus Fauteux is a 72 y.o. male  with past medical history of ALS, aphasia, dysphagia, history of aspiration pneumonia, history of heart disease, history of BPH, protein calorie malnutrition severe, small bowel obstruction and earlier part of this year, presenting with shortness of breath and suspicion of aspiration. Initial vitals by EMS show hypoxia with 87% on room air and patient was started on 2 L nasal cannula with O2 sats improving to 94% and initial blood sugar of 199.  In the hospital patient was started on high flow nasal cannula.  O2 sats improving to 96% on 15 L high flow nasal cannula. .. "       Assessment and Plan:   Acute hypoxic respiratory failure secondary to left mainstem bronchus obstruction from mucous and debris According to the patient's wife their wishes is DNI DNR. According to patient's wife neurologist have made him aware never for him to be intubated as he would not be able to come off the ventilator. They have agreed for antibiotic therapy and no bronchoscopy as this will require intubation. Continue high flow oxygen  Continue antibiotic therapy Continue chest PT as tolerated Continue bronchodilator and nebulizer with hypertonic saline per RT.  Continue antibiotic therapy Pulmonologist on board and case discussed We will transfer patient from the ICU to progressive unit today   Aspiration pneumonia Continue management as above Severe sepsis secondary to aspiration pneumonia Patient presented with Tmax of 100.4 in the  setting of tachycardia as well as tachypnea up to 42 also with leukocytosis of 12.3 in the setting of respiratory failure with hypoxia Continue current antibiotics as well as above management    ALS (amyotrophic lateral sclerosis) Patient uses walker at baseline Continue PT OT Outpatient follow-up with neurologist at discharge   Coronary artery disease involving native heart valve without angina pectoris No chest pain or Anginal symptoms and EKG non ischemic.    BPH-not on any home medication     Protein calorie malnutrition-ruled out Patient with normal albumin as well as BMI     DVT prophylaxis: Continue heparin   Consults: Pulmonologist on board we appreciate input     Advance Care Planning: DNR/DNI     Family Communication: Discussed with patient's wife present at bedside        Physical Exam:   Vitals and nursing note reviewed.  Constitutional: In some distress requiring high flow nasal oxygenation HENT: Atraumatic normocephalic Eyes:     General: Lids are normal.     Extraocular Movements: Extraocular movements intact.  Cardiovascular: Heart sounds 1 and 2 present tachycardic Pulmonary: Decreased air entry bilaterally especially at the bases Abdominal:     General: Bowel sounds are normal. There is no distension.     Palpations: Abdomen is soft. There is no mass.     Tenderness: There is no abdominal tenderness.  Musculoskeletal:     Right lower leg: No edema.     Left lower leg: No edema.  Skin:    General: Skin is warm.  Neurological:     Mental Status:  He is alert and oriented to person, place, and time.     Cranial Nerves: Cranial nerves 2-12 are intact.  Psychiatric:        Attention and Perception: Attention normal.        Mood and Affect: Mood normal.        Speech: Speech normal.        Behavior: Behavior normal. Behavior is cooperative.     Data Reviewed: I have reviewed the patient's records including pulmonologist documentation, I have also  reviewed patient's lab results as documented below as well as vitals and nursing documentation       Vitals:   12/03/22 1300 12/03/22 1400 12/03/22 1500 12/03/22 1550  BP: (!) 109/59 (!) 155/87 109/63   Pulse: 84 91 73 83  Resp: (!) 33 (!) 29 (!) 33 (!) 32  Temp:      TempSrc:      SpO2: 97% 96% 100% 98%  Weight:      Height:          Latest Ref Rng & Units 12/03/2022    4:12 AM 12/02/2022    3:19 AM 11/29/2022    3:34 PM  CBC  WBC 4.0 - 10.5 K/uL 14.6  12.1  12.3   Hemoglobin 13.0 - 17.0 g/dL 08.6  57.8  46.9   Hematocrit 39.0 - 52.0 % 36.8  38.0  40.2   Platelets 150 - 400 K/uL 169  172  170        Latest Ref Rng & Units 12/03/2022    4:12 AM 12/02/2022    3:19 AM 12/02/2022    3:34 PM  BMP  Glucose 70 - 99 mg/dL 629  528  413   BUN 8 - 23 mg/dL 23  20  24    Creatinine 0.61 - 1.24 mg/dL 2.44  0.10  2.72   Sodium 135 - 145 mmol/L 139  138  139   Potassium 3.5 - 5.1 mmol/L 3.9  3.8  4.0   Chloride 98 - 111 mmol/L 98  101  97   CO2 22 - 32 mmol/L 32  28  30   Calcium 8.9 - 10.3 mg/dL 8.8  8.9  9.1     Author: Loyce Dys, MD 12/03/2022 4:25 PM  For on call review [email or web address].

## 2022-12-03 NOTE — Progress Notes (Signed)
Physical Therapy Treatment Patient Details Name: Donald Gould MRN: [id number] DOB: Feb 02, 1951 Today's Date: 12/03/2022   History of Present Illness Pt is a 72 y.o. male  with past medical history of ALS, aphasia, dysphagia, history of aspiration pneumonia, history of heart disease, history of BPH, protein calorie malnutrition severe, small bowel obstruction and earlier part of this year, presenting with concerns today of shortness of breath and suspicion of aspiration.  Per reportwife states that their suctioning went out yesterday due to his history of ALS patient requires frequent deep suctioning.Today he was with home health and was so weak they had to lay him on the ground.    PT Comments  Pt is seen by OT and PT for co-treatment. Pt is steadily progressing towards PT goals. Pt is received in recliner with spouse at bedside, he is agreeable to session. Pt performs transfers min A x2 and standing exercises CGA. Frequent cuing required throughout STS transition for hand placement and forward weight shift to facilitate task. Pt required rest break in between standing exercise sets due to increase WOB and fatigue. Educated Pt and spouse about plan to focus on BLE strengthening and activity tolerance-Pt and spouse verbalized understanding. Overall, Pt able to demonstrate slight improvements although decreased endurance continues to be a barrier. Pt would benefit from cont skilled PT to address above deficits and promote optimal return to PLOF.     If plan is discharge home, recommend the following: A lot of help with walking and/or transfers;A lot of help with bathing/dressing/bathroom;Help with stairs or ramp for entrance;Assist for transportation   Can travel by private vehicle     No  Equipment Recommendations  Other (comment) (TBD at next facility)    Recommendations for Other Services       Precautions / Restrictions Precautions Precautions: Fall Restrictions Weight Bearing  Restrictions: No (Simultaneous filing. User may not have seen previous data.)     Mobility  Bed Mobility Overal bed mobility: Needs Assistance             General bed mobility comments: Unable to assess due to Pt in recliner at beginning of session    Transfers Overall transfer level: Needs assistance Equipment used: Rolling walker (2 wheels) Transfers: Sit to/from Stand Sit to Stand: Min assist, +2 physical assistance           General transfer comment: Pt requires min A x2 for initiation of task; cuing for hand placement and upright standing posture    Ambulation/Gait               General Gait Details: Not performed secondary to Pt's request and safety concerns   Stairs             Wheelchair Mobility     Tilt Bed    Modified Rankin (Stroke Patients Only)       Balance Overall balance assessment: History of Falls, Needs assistance Sitting-balance support: Feet supported, Bilateral upper extremity supported   Sitting balance - Comments: Able to sustain seated balance at edge of recliner with BUE support   Standing balance support: Bilateral upper extremity supported, During functional activity, Reliant on assistive device for balance Standing balance-Leahy Scale: Fair Standing balance comment: Able to maintain static standing balance with use of AD; heavy reliance on AD                            Cognition Arousal: Alert Behavior During Therapy: [facility] for  tasks assessed/performed Overall Cognitive Status: Within Functional Limits for tasks assessed                                 General Comments: Pleasant and agreeable to therapy; spouse at bedside        Exercises Other Exercises Other Exercises: 2x10 standing marches using RW CGA    General Comments        Pertinent Vitals/Pain Pain Assessment Pain Assessment: No/denies pain    Home Living                          Prior Function             PT Goals (current goals can now be found in the care plan section) Acute Rehab PT Goals Patient Stated Goal: to get better PT Goal Formulation: With patient Time For Goal Achievement: 12/16/22 Potential to Achieve Goals: Fair Progress towards PT goals: Progressing toward goals    Frequency    Min 1X/week      PT Plan      Co-evaluation PT/OT/SLP Co-Evaluation/Treatment: Yes Reason for Co-Treatment: To address functional/ADL transfers PT goals addressed during session: Mobility/safety with mobility OT goals addressed during session: ADL's and self-care      AM-PAC PT "6 Clicks" Mobility   Outcome Measure  Help needed turning from your back to your side while in a flat bed without using bedrails?: A Little Help needed moving from lying on your back to sitting on the side of a flat bed without using bedrails?: A Little Help needed moving to and from a bed to a chair (including a wheelchair)?: A Lot Help needed standing up from a chair using your arms (e.g., wheelchair or bedside chair)?: A Little Help needed to walk in hospital room?: A Lot Help needed climbing 3-5 steps with a railing? : Total 6 Click Score: 14    End of Session   Activity Tolerance: Patient limited by fatigue Patient left: with call bell/phone within reach;with family/visitor present;in chair;with nursing/sitter in room Nurse Communication: Mobility status PT Visit Diagnosis: Unsteadiness on feet (R26.81);Repeated falls (R29.6);History of falling (Z91.81);Muscle weakness (generalized) (M62.81)     Time: 4098-1191 PT Time Calculation (min) (ACUTE ONLY): 23 min  Charges:                            Elmon Else, SPT    Chinedum Vanhouten 12/03/2022, 11:57 AM

## 2022-12-03 NOTE — Progress Notes (Signed)
Palliative:  HPI: 72 y.o. male  with past medical history of ALS, aphasia, dysphagia, aspiration, BPH, malnutrition, s/p PEG, SBO admitted on 12/15/2022 with shortness of breath with aspiration pneumonia in the setting of progressing ALS.   I met today at [facility] bedside along with wife, Donald Gould. Initially Donald Gould is constantly utilizing yankeur for oral suction and seemed labored with accessory muscle use as well as tachypnea. Donald Gould feels he is similar to his baseline at home and has been resting today fairly well. She tells me about recommendation for SNF rehab. We spent time discussing SNF rehab and that he would be very unlikely to progress with rehab stay given his progressing ALS to the point of having hospice prior to hospitalization. Donald Gould would consider SNF rehab as she is struggling to care for him at home. She has been in touch with caregivers to consider increasing caregiver support in the home. She will look into his long term care policy to see where they are in activating policy to assist with support.   Donald Gould is very overwhelmed and struggling to know what will be best. We discussed that anything can change at anytime to lead more towards end of life. We agree to see what rehab facilities may be options and if insurance will approve stay. We discussed what Donald Gould would likely want and she does believe he would want to be at home. I discussed further with her the role of hospice and how they should help her at home as his condition changes and declines. I also spoke with Donald Gould about considering how she feels about Donald Gould dying in their home and if this is something she would be comfortable with - she tells me that she has not considered this yet. She will consider these factors to help her judge the decision to return home with hospice vs pursuing placement SNF rehab vs long term care. Hospice is appropriate to continue but they will need increased guidance and support at home.   All  questions/concerns addressed. Emotional support provided.   Exam: Alert. Less engaged today in conversation. Up in recliner. Breathing seems transiently distressed. Abd soft.   Plan: - DNR - Consider comfort if he declines - Needs ongoing discussion for most appropriate and desired disposition based on outcomes  55 min  Yong Channel, NP Palliative Medicine Team Pager [phone] (Please see [email or web address] for schedule) Team Phone [phone]    Greater than 50%  of this time was spent counseling and coordinating care related to the above assessment and plan

## 2022-12-03 NOTE — IPAL (Signed)
  Interdisciplinary Goals of Care Family Meeting   Date carried out: 12/03/2022  Location of the meeting: Bedside  Member's involved: Physician and Family Member or next of kin     GOALS OF CARE DISCUSSION  The Clinical status was relayed to family in detail- WIFE at bedside Explained to family course of therapy and the modalities   Patient with Progressive multiorgan failure with a very high probablity of a very minimal chance of meaningful recovery despite all aggressive and optimal medical therapy.  PATIENT REMAINS DNR/DNI  Family understands the situation. Continue oxygen IV abx, IV steroids and BD therapy   Family are satisfied with Plan of action and management. All questions answered  Additional CC time 25 mins   Jarel Cuadra Santiago Glad, M.D.  [facility]  Medical Director [facility] Medical Director [facility] Cardio-Pulmonary Department

## 2022-12-03 NOTE — Progress Notes (Signed)
Occupational Therapy Treatment Patient Details Name: Donald Gould MRN: [id number] DOB: [birth year] Today's Date: 12/03/2022   History of present illness Pt is a 72 y.o. male  with past medical history of ALS, aphasia, dysphagia, history of aspiration pneumonia, history of heart disease, history of BPH, protein calorie malnutrition severe, small bowel obstruction and earlier part of this year, presenting with concerns today of shortness of breath and suspicion of aspiration.  Per reportwife states that their suctioning went out yesterday due to his history of ALS patient requires frequent deep suctioning.Today he was with home health and was so weak they had to lay him on the ground.   OT comments  Upon entering the room, pt seated in recliner chair with wife present in room. Pt seen for skilled co-treatment with PT. Pt stands from recliner chair with min A of 2 with cuing for upright positioning. Pt stands and performs standing marches 2 sets of 10 reps. Pt needing to sit and rest between reps. Pt continues to utilize yonker and swab for oral care during session without assistance. [name] reports she is unable to care for him at this level of assistance at home and would like for him to go to rehab to continue to address deficits. Pt is cooperative during session and remains seated in recliner chair at end of session with call bell and all needed items within reach.       If plan is discharge home, recommend the following:  A lot of help with walking and/or transfers;A lot of help with bathing/dressing/bathroom;Assistance with cooking/housework;Assist for transportation;Help with stairs or ramp for entrance;Direct supervision/assist for financial management;Direct supervision/assist for medications management   Equipment Recommendations  Other (comment) (defer to next venue of care)       Precautions / Restrictions Precautions Precautions: Fall Restrictions Weight Bearing Restrictions: No  (Simultaneous filing. User may not have seen previous data.)       Mobility Bed Mobility               General bed mobility comments: Unable to assess due to Pt in recliner at beginning of session    Transfers Overall transfer level: Needs assistance Equipment used: Rolling walker (2 wheels) Transfers: Sit to/from Stand Sit to Stand: Min assist, +2 physical assistance           General transfer comment: Pt requires min A x2 for initiation of task; cuing for hand placement and upright standing posture     Balance Overall balance assessment: History of Falls, Needs assistance Sitting-balance support: Feet supported, Bilateral upper extremity supported   Sitting balance - Comments: Able to sustain seated balance at edge of recliner with BUE support   Standing balance support: Bilateral upper extremity supported, During functional activity, Reliant on assistive device for balance Standing balance-Leahy Scale: Fair Standing balance comment: Able to maintain static standing balance with use of AD; heavy reliance on AD                           ADL either performed or assessed with clinical judgement   ADL                                         General ADL Comments: Pt utilized yonker and swab for oral care without assistance.    Extremity/Trunk Assessment Upper Extremity Assessment Upper Extremity Assessment:  Generalized weakness   Lower Extremity Assessment Lower Extremity Assessment: Generalized weakness        Vision Patient Visual Report: No change from baseline            Cognition Arousal: Alert Behavior During Therapy: WFL for tasks assessed/performed Overall Cognitive Status: Within Functional Limits for tasks assessed                                 General Comments: Pleasant and agreeable to therapy; spouse at bedside                   Pertinent Vitals/ Pain       Pain Assessment Pain  Assessment: Faces Faces Pain Scale: No hurt         Frequency  Min 1X/week        Progress Toward Goals  OT Goals(current goals can now be found in the care plan section)  Progress towards OT goals: Progressing toward goals         Co-evaluation      Reason for Co-Treatment: To address functional/ADL transfers PT goals addressed during session: Mobility/safety with mobility OT goals addressed during session: ADL's and self-care      AM-PAC OT "6 Clicks" Daily Activity     Outcome Measure   Help from another person eating meals?: A Little Help from another person taking care of personal grooming?: A Little Help from another person toileting, which includes using toliet, bedpan, or urinal?: A Lot Help from another person bathing (including washing, rinsing, drying)?: A Lot Help from another person to put on and taking off regular upper body clothing?: A Lot Help from another person to put on and taking off regular lower body clothing?: Total 6 Click Score: 13    End of Session Equipment Utilized During Treatment: Oxygen (10Ls HFNC)  OT Visit Diagnosis: Unsteadiness on feet (R26.81);Repeated falls (R29.6);Muscle weakness (generalized) (M62.81)   Activity Tolerance Patient tolerated treatment well   Patient Left with call bell/phone within reach;with family/visitor present;in chair   Nurse Communication Mobility status        Time: 1027-1050 OT Time Calculation (min): 23 min  Charges: OT General Charges $OT Visit: 1 Visit OT Treatments $Therapeutic Activity: 8-22 mins  Jackquline Denmark, MS, OTR/L , CBIS ascom [phone]  12/03/22, 1:28 PM

## 2022-12-04 DIAGNOSIS — G1221 Amyotrophic lateral sclerosis: Secondary | ICD-10-CM | POA: Diagnosis not present

## 2022-12-04 DIAGNOSIS — I4892 Unspecified atrial flutter: Secondary | ICD-10-CM | POA: Diagnosis not present

## 2022-12-04 DIAGNOSIS — J9601 Acute respiratory failure with hypoxia: Secondary | ICD-10-CM | POA: Diagnosis not present

## 2022-12-04 DIAGNOSIS — I4891 Unspecified atrial fibrillation: Secondary | ICD-10-CM

## 2022-12-04 DIAGNOSIS — J690 Pneumonitis due to inhalation of food and vomit: Secondary | ICD-10-CM | POA: Diagnosis not present

## 2022-12-04 DIAGNOSIS — Z515 Encounter for palliative care: Secondary | ICD-10-CM | POA: Diagnosis not present

## 2022-12-04 DIAGNOSIS — R9431 Abnormal electrocardiogram [ECG] [EKG]: Secondary | ICD-10-CM

## 2022-12-04 DIAGNOSIS — Z7189 Other specified counseling: Secondary | ICD-10-CM | POA: Diagnosis not present

## 2022-12-04 LAB — TROPONIN I (HIGH SENSITIVITY)
Troponin I (High Sensitivity): 52 ng/L — ABNORMAL HIGH (ref <18)
Troponin I (High Sensitivity): 46 ng/L — ABNORMAL HIGH (ref <18)
Troponin I (High Sensitivity): 41 ng/L — ABNORMAL HIGH (ref <18)

## 2022-12-04 LAB — GLUCOSE, CAPILLARY
Glucose-Capillary: 105 mg/dL — ABNORMAL HIGH (ref 70–99)
Glucose-Capillary: 111 mg/dL — ABNORMAL HIGH (ref 70–99)

## 2022-12-04 LAB — D-DIMER, QUANTITATIVE: D-Dimer, Quant: 1.45 ug{FEU}/mL — ABNORMAL HIGH (ref 0.00–0.50)

## 2022-12-04 LAB — PROTIME-INR
INR: 1.1 (ref 0.8–1.2)
Prothrombin Time: 14.5 s (ref 11.4–15.2)

## 2022-12-04 MED ORDER — ONDANSETRON HCL 4 MG/2ML IJ SOLN: 4.0000 mg | Freq: Four times a day (QID) | INTRAMUSCULAR | Status: DC | PRN

## 2022-12-04 MED ORDER — ACETAMINOPHEN 325 MG PO TABS: 325.0000 mg | ORAL_TABLET | Freq: Four times a day (QID) | ORAL | Status: DC | PRN

## 2022-12-04 MED ORDER — HEPARIN (PORCINE) 25000 UT/250ML-% IV SOLN
1000.0000 [IU]/h | INTRAVENOUS | Status: DC
  Administered 2022-12-04: 1000 [IU]/h via INTRAVENOUS
  Filled 2022-12-04: qty 250

## 2022-12-04 MED ORDER — GLYCOPYRROLATE 0.2 MG/ML IJ SOLN
0.4000 mg | INTRAMUSCULAR | Status: DC
  Administered 2022-12-04: 0.4 mg via INTRAVENOUS
  Filled 2022-12-04: qty 2

## 2022-12-04 MED ORDER — AMIODARONE HCL IN DEXTROSE 360-4.14 MG/200ML-% IV SOLN
60.0000 mg/h | INTRAVENOUS | Status: DC
  Administered 2022-12-04: 60 mg/h via INTRAVENOUS
  Filled 2022-12-04: qty 200

## 2022-12-04 MED ORDER — MORPHINE 100MG IN NS 100ML (1MG/ML) PREMIX INFUSION
2.0000 mg/h | INTRAVENOUS | Status: DC
  Administered 2022-12-04: 2 mg/h via INTRAVENOUS
  Filled 2022-12-04: qty 100

## 2022-12-04 MED ORDER — ASPIRIN 81 MG PO CHEW: 81.0000 mg | CHEWABLE_TABLET | Freq: Every day | ORAL | Status: DC

## 2022-12-04 MED ORDER — MORPHINE BOLUS VIA INFUSION: 2.0000 mg | INTRAVENOUS | Status: DC | PRN

## 2022-12-04 MED ORDER — BIOTENE DRY MOUTH MT LIQD: 15.0000 mL | OROMUCOSAL | Status: DC | PRN

## 2022-12-04 MED ORDER — HALOPERIDOL LACTATE 5 MG/ML IJ SOLN: 0.5000 mg | INTRAMUSCULAR | Status: DC | PRN

## 2022-12-04 MED ORDER — ONDANSETRON 4 MG PO TBDP: 4.0000 mg | ORAL_TABLET | Freq: Four times a day (QID) | ORAL | Status: DC | PRN

## 2022-12-04 MED ORDER — ASPIRIN 325 MG PO TABS: 325.0000 mg | ORAL_TABLET | Freq: Every day | ORAL | Status: DC

## 2022-12-04 MED ORDER — HALOPERIDOL 0.5 MG PO TABS: 0.5000 mg | ORAL_TABLET | ORAL | Status: DC | PRN

## 2022-12-04 MED ORDER — ASPIRIN 81 MG PO CHEW
324.0000 mg | CHEWABLE_TABLET | Freq: Once | ORAL | Status: AC
  Administered 2022-12-04: 324 mg via ORAL
  Filled 2022-12-04: qty 4

## 2022-12-04 MED ORDER — ACETAMINOPHEN 650 MG RE SUPP: 325.0000 mg | Freq: Four times a day (QID) | RECTAL | Status: DC | PRN

## 2022-12-04 MED ORDER — SODIUM CHLORIDE 0.9 % IV BOLUS
1000.0000 mL | Freq: Once | INTRAVENOUS | Status: AC
  Administered 2022-12-04: 1000 mL via INTRAVENOUS

## 2022-12-04 MED ORDER — POLYVINYL ALCOHOL 1.4 % OP SOLN: 1.0000 [drp] | Freq: Four times a day (QID) | OPHTHALMIC | Status: DC | PRN

## 2022-12-04 MED ORDER — AMIODARONE HCL IN DEXTROSE 360-4.14 MG/200ML-% IV SOLN: 30.0000 mg/h | INTRAVENOUS | Status: DC

## 2022-12-04 MED ORDER — MORPHINE BOLUS VIA INFUSION: 4.0000 mg | INTRAVENOUS | Status: DC | PRN

## 2022-12-04 MED ORDER — HEPARIN BOLUS VIA INFUSION
4000.0000 [IU] | Freq: Once | INTRAVENOUS | Status: AC
  Administered 2022-12-04: 4000 [IU] via INTRAVENOUS
  Filled 2022-12-04: qty 4000

## 2022-12-04 MED ORDER — HALOPERIDOL LACTATE 2 MG/ML PO CONC: 0.5000 mg | ORAL | Status: DC | PRN

## 2022-12-04 MED ORDER — LORAZEPAM 2 MG/ML IJ SOLN
1.0000 mg | INTRAMUSCULAR | Status: DC | PRN
  Administered 2022-12-04: 2 mg via INTRAVENOUS

## 2022-12-04 MED ORDER — LORAZEPAM 2 MG/ML IJ SOLN
1.0000 mg | Freq: Three times a day (TID) | INTRAMUSCULAR | Status: DC
  Filled 2022-12-04: qty 1

## 2022-12-05 ENCOUNTER — Ambulatory Visit: Payer: Medicare Other | Admitting: Speech Pathology

## 2022-12-05 LAB — CULTURE, RESPIRATORY W GRAM STAIN

## 2022-12-05 LAB — CULTURE, BLOOD (ROUTINE X 2)

## 2022-12-08 LAB — CULTURE, BLOOD (ROUTINE X 2)
Culture: NO GROWTH
Special Requests: ADEQUATE

## 2022-12-17 NOTE — Progress Notes (Signed)
MD Para March notified of pt's new onset of A-fib 110-128 & acute hypotension (75/58 (MAP 65)) at 00:27. Provider also made aware that pt was febrile at shift change 101.1 F.ax (mouth breather) and was given 650mg  of tylenol and pt is now afebrile at 98.6 F .ax EKG preformed and provider evaluated. See orders in [facility]. Pt appears in NAD at this time, provider preformed bedside eval. No additional concerns at this time.

## 2022-12-17 NOTE — Consult Note (Signed)
Cardiology Consultation   Patient ID: [name] MRN: [id number]; DOB: November 13, 1950  Admit date: 12/11/2022 Date of Consult: 12/25/22  PCP:  Kandyce Rud, MD   [facility] Providers Cardiologist:  New - Zorana Brockwell     Patient Profile:   Donald Gould is a 72 y.o. male with a hx of ALS, thrombocytopenia, chronic anemia, dysphagia with recurrent aspiration complicated by aspiration pneumonia status post G-tube, BPH, and hyperlipidemia, who is being seen 12-25-22 for the evaluation of abnormal EKG at the request of Dr. Para March.  History of Present Illness:   Donald Gould is nonverbal.  History is obtained from the chart and Dr. Para March.  Donald Gould was admitted 3 days ago due to profound weakness.  His home suctioning device for deep suctioning had stopped working.  He was found to be hypoxic in the ER which improved with high flow nasal cannula.  Chest radiograph was initially concerning for mild interstitial edema.  Subsequent CTA chest showed aspirated degree within the trachea as well as the left mainstem bronchus and multiple left lingular and lower lobe bronchi.  He was admitted to stepdown for treatment of his acute respiratory failure with hypoxia due to aspiration pneumonitis.  He was noted to have gone into atrial fibrillation around 9 PM yesterday.  EKG done shortly after midnight confirmed atrial fibrillation but was also concerning for inferolateral ST elevation.  Transient hypotension was also noted, with a minimum blood pressure of 74/57.  Donald Gould appeared comfortable.  On my evaluation, he seems restless and is tugging at his blankets.  He does not open his eyes to voice or gentle tactile stimulation.  BiPAP mask is in place.  Donald Gould was easily followed by cardiologist in [place], [place], having last been seen in 2020.  Elevated coronary calcium score and hyperlipidemia were noted, for which you had been placed on a PCSK9 inhibitor due to statin  intolerance.  Echocardiogram at that time was unrevealing.   Past Medical History:  Diagnosis Date   ALS (amyotrophic lateral sclerosis) (HCC)    Anemia, unspecified 09/14/2022   Environmental allergies     Past Surgical History:  Procedure Laterality Date   CATARACT EXTRACTION W/PHACO Right 07/02/2021   Procedure: CATARACT EXTRACTION PHACO AND INTRAOCULAR LENS PLACEMENT (IOC) RIGHT;  Surgeon: Lockie Mola, MD;  Location: [facility];  Service: Ophthalmology;  Laterality: Right;  11.84 1:15.6   RETINAL DETACHMENT SURGERY Left 01/10/2021   [facility]     Inpatient Medications: Scheduled Meds:  Chlorhexidine Gluconate Cloth  6 each Topical Daily   feeding supplement (KATE FARMS STANDARD 1.4)  650 mL Per Tube TID WC   free water  240 mL Per Tube TID WC   glycopyrrolate  2 mg Per Tube TID   LORazepam  0.5 mg Per Tube BID   pantoprazole (PROTONIX) IV  40 mg Intravenous Q12H   riluzole  50 mg Per Tube Q12H   Continuous Infusions:  heparin 1,000 Units/hr (Dec 25, 2022 0448)   piperacillin-tazobactam (ZOSYN)  IV Stopped (12/03/22 2146)   PRN Meds: acetaminophen **OR** acetaminophen, albuterol, ciprofloxacin, guaiFENesin, hyoscyamine, LORazepam, morphine injection, mouth rinse  Allergies:    Allergies  Allergen Reactions   Other Other (See Comments)    Hip pain  Muscle and joint issues.  Joint pain    Hip pain  Muscle and joint issues.   Statins Rash    Joint pain    Social History:   Social History   Tobacco Use   Smoking status: Former  Current packs/day: 0.00    Types: Cigarettes    Quit date: 1990    Years since quitting: 34.7   Smokeless tobacco: Never  Vaping Use   Vaping status: Never Used  Substance Use Topics   Alcohol use: Yes    Alcohol/week: 11.0 standard drinks of alcohol    Types: 11 Standard drinks or equivalent per week    Comment: 1.5 shots/day      Family History:   Unable to be obtained due to critical illness  ROS:  Unable  to perform due to critical illness  Physical Exam/Data:   Vitals:   12/28/2022 0203 12-28-22 0207 12/28/22 0215 28-Dec-2022 0230  BP: (S) 108/65  (!) 82/62 90/60  Pulse:  (!) 106  (!) 108  Resp: (!) 23 (!) 21 (!) 24 (!) 22  Temp:      TempSrc:      SpO2:  98%  99%  Weight:      Height:        Intake/Output Summary (Last 24 hours) at 28-Dec-2022 0543 Last data filed at 12/28/22 0347 Gross per 24 hour  Intake 1216.3 ml  Output 800 ml  Net 416.3 ml      12/03/2022    4:44 AM 11/17/2022    2:56 PM 09/05/2022    1:23 PM  Last 3 Weights  Weight (lbs) 163 lb 9.3 oz 166 lb 0.1 oz 166 lb 0.1 oz  Weight (kg) 74.2 kg 75.3 kg 75.3 kg     Body mass index is 20.45 kg/m.  General: Frail, chronically ill-appearing man lying in bed.  He is wearing BiPAP. HEENT: BiPAP mask in place. Neck: To assess JVP due to BiPAP mask and positioning. Cardiac: Irregularly irregular without murmurs. Lungs: Coarse breath sounds with bilateral rhonchi anteriorly. Abd: soft, nontender, no hepatomegaly  Ext: 1+ pretibial edema bilaterally. Musculoskeletal:  No deformities. Skin: warm and dry  Neuro: Does not respond to voice but moves upper extremities spontaneously. Psych: Unable to assess, as the patient is on BiPAP and is nonverbal.  EKG: Serial EKGs performed today were personally reviewed.  The first tracing obtained at 12:27 AM shows atrial fibrillation with rightward axis and inferolateral ST elevation.  This was still present on repeat EKG at 2:52 AM.  Most recent tracing from 5:05 AM shows atrial flutter with variable AV block and subtle inferolateral ST elevation, partially obscured by underlying flutter waves. Telemetry:  Telemetry was personally reviewed and demonstrates: Normal sinus rhythm with conversion to atrial fibrillation at 8:58 PM yesterday.  Ventricular rates have been predominantly 80-120 bpm.  Relevant CV Studies: None  Laboratory Data:  High Sensitivity Troponin:   Recent Labs  Lab  12/02/2022 1534 December 28, 2022 0241 28-Dec-2022 0436  TROPONINIHS 20* 52* 46*     Chemistry Recent Labs  Lab 11/21/2022 1534 12/02/22 0319 12/03/22 0412 12-28-2022 0241  NA 139 138 139 139  K 4.0 3.8 3.9 4.8  CL 97* 101 98 101  CO2 30 28 32 31  GLUCOSE 111* 115* 115* 132*  BUN 24* 20 23 30*  CREATININE 0.49* 0.38* 0.49* 0.55*  CALCIUM 9.1 8.9 8.8* 8.8*  MG 2.2 2.3  --   --   GFRNONAA >60 >60 >60 >60  ANIONGAP 12 9 9 7     Recent Labs  Lab 12/08/2022 1534 12/02/22 0319 12/03/22 0412 12-28-22 0241  PROT 6.6 5.8*  --   --   ALBUMIN 3.6 3.5 3.2* 3.1*  AST 30 24  --   --  ALT 26 24  --   --   ALKPHOS 48 43  --   --   BILITOT 0.7 0.9  --   --    Lipids No results for input(s): "CHOL", "TRIG", "HDL", "LABVLDL", "LDLCALC", "CHOLHDL" in the last 168 hours.  Hematology Recent Labs  Lab 12/02/22 0319 12/03/22 0412 12/11/2022 0241  WBC 12.1* 14.6* 12.8*  RBC 4.15* 4.00* 4.51  HGB 12.2* 11.9* 13.6  HCT 38.0* 36.8* 43.2  MCV 91.6 92.0 95.8  MCH 29.4 29.8 30.2  MCHC 32.1 32.3 31.5  RDW 13.2 13.2 12.9  PLT 172 169 148*   Thyroid No results for input(s): "TSH", "FREET4" in the last 168 hours.  BNP Recent Labs  Lab 12-11-22 1534  BNP 38.8    DDimer  Recent Labs  Lab 11/27/2022 0310  DDIMER 1.45*     Radiology/Studies:  CT Angio Chest PE W and/or Wo Contrast  Result Date: December 11, 2022 CLINICAL DATA:  Shortness of breath. History of ALS. Concern for aspiration. EXAM: CT ANGIOGRAPHY CHEST WITH CONTRAST TECHNIQUE: Multidetector CT imaging of the chest was performed using the standard protocol during bolus administration of intravenous contrast. Multiplanar CT image reconstructions and MIPs were obtained to evaluate the vascular anatomy. RADIATION DOSE REDUCTION: This exam was performed according to the departmental dose-optimization program which includes automated exposure control, adjustment of the mA and/or kV according to patient size and/or use of iterative reconstruction technique.  CONTRAST:  75mL OMNIPAQUE IOHEXOL 350 MG/ML SOLN COMPARISON:  Chest radiograph 12-11-2022 FINDINGS: Cardiovascular: Normal heart size. No pericardial effusion. Coronary artery and aortic atherosclerotic calcification. Mediastinum/Nodes: Esophagus is unremarkable. No thoracic adenopathy. Lungs/Pleura: Frothy debris within the trachea and occluding the left mainstem bronchus and multiple left lingular and lower lobe bronchi. Left-greater-than-right basilar atelectasis. No pleural effusion or pneumothorax. Upper Abdomen: No acute abnormality. Musculoskeletal: No acute fracture. Review of the MIP images confirms the above findings. IMPRESSION: Aspirated debris within the trachea and occluding the left mainstem bronchus and multiple left lingular and lower lobe bronchi. Aortic Atherosclerosis (ICD10-I70.0). Electronically Signed   By: Minerva Fester M.D.   On: 12/11/22 18:13   DG Chest Port 1 View  Result Date: 12/11/2022 CLINICAL DATA:  Shortness of breath.  History of ALS. EXAM: PORTABLE CHEST 1 VIEW COMPARISON:  04/24/22 FINDINGS: Heart size appears normal. No pleural effusion or airspace opacity. Mild peripheral increased septal markings identified. Visualized osseous structures are un remarkable. IMPRESSION: Mild peripheral increased septal markings suggest mild interstitial edema. No airspace opacities or atelectasis identified. Electronically Signed   By: Signa Kell M.D.   On: 12/11/22 15:14     Assessment and Plan:   Abnormal EKG: EKG's obtained today show inferolateral ST segment elevation, which is new compared to admission and concerning for acute myocardial infarction.  Unfortunately, Mr. Leinonen cannot provide any history or describe any of his symptoms.  It is notable that his troponin has remained flat with a.  I would have expected a significant rise if he were having an acute myocardial infarction.  It is possible that his ST segment changes are due to something else, such as acute  pericarditis.  I discussed Mr. Casa's case with Dr. Para March earlier this morning.  She spoke with Mr. Mielnicki wife, who indicated that Mr. Veley would not wish to undergo any invasive procedures or reverse his DNR/DNI status.  As such, he is not a candidate for emergent cardiac catheterization and possible PCI.  Aspirin and heparin have been administered, which are okay to  continue pending further evaluation.  I think would be reasonable to repeat 1 more troponin.  If it remains flat, further troponins do not need to be checked.  I have also ordered an echocardiogram.  New onset atrial fibrillation/flutter: This began around 9 PM yesterday.  Ventricular rates are borderline elevated at times.  Heparin has been started due to concern for acute MI.  It can also be continued for atrial fibrillation, though I do not think that Mr. Bournes would be a good long-term candidate for this given his comorbidities and a CHA2DS2-VASc score of 1.  Due to his soft blood pressure with atrial fibrillation/flutter, I will initiate amiodarone infusion for rate control and hopefully pharmacologic cardioversion.  Aspiration pneumonia: Continue antimicrobial therapy and supplemental oxygen per internal medicine and critical care teams.  For questions or updates, please contact [facility] Please consult [email or web address] for contact info under [facility].  Signed, Yvonne Kendall, MD  12/12/22 5:43 AM

## 2022-12-17 NOTE — Significant Event (Signed)
[facility] STEMI Evaluation Note  Date: 12/14/2022  Time: 3:54 AM  I was contacted by Dr. Para March due to abnormal EKG findings concerning for STEMI.  The patient is admitted with acute respiratory failure in the setting of ALS.  He was noted to be tachycardic and hypotensive early this morning.  EKG at 0027 showed atrial fibrillation with atrial fibrillation with rightward axis and inferolateral ST elevation.  Repeat EKG 0252 showed atrial fibrillation with inferolateral ST elevation.  Patient is nonverbal at baseline but looks comfortable per Dr. Para March.  His hypotension has resolved.  Dr. Para March spoke with Donald Gould wife, who reports that her husband does not wish to reverse his DNR/DNI for emergent cardiac catheterization and would prefer noninvasive treatment.  Given his ALS, I think this is reasonable.  I have recommended that Donald Gould receive aspirin 324 mg x 1 and be started on heparin infusion.  Formal consult to follow.  Yvonne Kendall, MD [facility]

## 2022-12-17 NOTE — Progress Notes (Addendum)
Pt in rhythm changed from A-flutter sustained to V.Bigemeny/A-flutter sustained.  Next RN [place] notified of rhythm change.    Amio started- no additional changes in care to report at this time

## 2022-12-17 NOTE — Progress Notes (Addendum)
Pt HR spiked to 167 while at bedside, pt rhythm now in A-flutter -new EKG preformed and MD. Para March notified.   MD. End up to unit at 0531 for EKG review and bedside assessment.   No new orders for nursing at this time.    Casely.Amy -MD. End confirmed pt was not a stemi with CRN Asher Muir as witness.

## 2022-12-17 NOTE — Progress Notes (Signed)
Pt placed on comfort care, palliative NP , Family at bedside. 4 mg of ativan given, morphine gtt  on  going at 4 ml/hr, will titrate at needed by patient. BiPAP removed and pt breathing easily and family at bedside.

## 2022-12-17 NOTE — Discharge Summary (Signed)
Death Summary  Donald Gould ZOX:[id number] DOB: Aug 24, 1950 DOA: 2022-12-12  PCP: Kandyce Rud, MD   Admit date: 12-12-22 Date of Death: Dec 15, 2022  Final Diagnoses:  Principal Problem:   Acute respiratory failure with hypoxia (HCC) Active Problems:   Aspiration pneumonia (HCC)   Severe sepsis (HCC)   ALS (amyotrophic lateral sclerosis) (HCC)   Coronary artery disease involving native heart without angina pectoris   BPH (benign prostatic hyperplasia)   Protein-calorie malnutrition, severe    History of present illness:  HPI was taken from Dr. Irena Cords: Donald Gould is a 72 y.o. male  with past medical history of ALS, aphasia, dysphagia, history of aspiration pneumonia, history of heart disease, history of BPH, protein calorie malnutrition severe, small bowel obstruction and earlier part of this year, presenting with concerns today of shortness of breath and suspicion of aspiration.  Per reportwife states that their suctioning went out yesterday due to his history of ALS patient requires frequent deep suctioning.Today he was with home health and was so weak they had to lay him on the ground.   Initial vitals by EMS show hypoxia with 87% on room air and patient was started on 2 L nasal cannula with O2 sats improving to 94% and initial blood sugar of 199.  In the hospital patient was started on high flow nasal cannula.  O2 sats improving to 96% on 15 L high flow nasal cannula.  Chart review has patient with a history of G-tube will verify and confirm mode and route of nutrition.   Initial vitals showed blood pressure 142/80 respirations of 26 heart rate of 92 patient's weight at 166 pounds.  Initial EKG showed sinus rhythm 85 no ST elevations, QTc of 438, Q waves in lead V1. Blood work today showed glucose of 111 creatinine of 0.49 initial troponin of 20, BNP of 38.8, white count of 12.3 with normal hemoglobin and platelets.  Respiratory panel negative for COVID.  I will order  procalcitonin  and lactic acid is pending  as patient meets sepsis criteria with respiratory failure infection tachycardia and white count.I will order lft to monitor any transaminitis.  Blood cultures are collected in the emergency room.   Initial imaging with chest x-ray done initially for patient's hypoxia and respiratory failure showed increased septal markings suggestive of mild interstitial edema followed up CT angio PE protocol showed Aspirated debris within the trachea and occluding the left mainstem  bronchus and multiple left lingular and lower lobe bronchi. In the emergency room patient was given DuoNeb and hypertonic saline nebulizer treatments, and Rocephin 1 g.    Currently SpO2: 95 % O2 Flow Rate (L/min): 15 L/min Pulmonary consulted and they will see him and will place order Dr.Dgayli.   Hospital Course:   As per Dr.Djan:  Acute hypoxic respiratory failure secondary to left mainstem bronchus obstruction from mucous and debris According to the patient's wife their wishes is DNI DNR. According to patient's wife neurologist have made him aware never for him to be intubated as he would not be able to come off the ventilator. They have agreed for antibiotic therapy and no bronchoscopy as this will require intubation. Continue high flow oxygen  Continue antibiotic therapy Continue chest PT as tolerated Continue bronchodilator and nebulizer with hypertonic saline per RT.  Continue antibiotic therapy Pulmonologist on board and case discussed We will transfer patient from the ICU to progressive unit today   Aspiration pneumonia Continue management as above Severe sepsis secondary to aspiration pneumonia Patient presented  with Tmax of 100.4 in the setting of tachycardia as well as tachypnea up to 42 also with leukocytosis of 12.3 in the setting of respiratory failure with hypoxia Continue current antibiotics as well as above management    ALS (amyotrophic lateral  sclerosis) Patient uses walker at baseline Continue PT OT Outpatient follow-up with neurologist at discharge   Coronary artery disease involving native heart valve without angina pectoris No chest pain or Anginal symptoms and EKG non ischemic.    As per Dr. Mayford Knife 11/26/2022:  Failure to thrive: secondary to ALS. Pt and pt's family decided to proceed w/ comfort care only. Pt passed away 12:47pm.      Signed:  Charise Killian  [facility] 12/16/2022, 5:44 PM

## 2022-12-17 NOTE — Progress Notes (Signed)
CROSS COVER NOTE  NAME: Donald Gould MRN: [id number] DOB : 03/22/1950    Concern as stated by nurse / staff   progressive step down and his BP is starting to trend down. (current 75/58 (65) taken twice) and a heart rhythm change from NSR to afib (obtaining a EKG now) this is Donald Gould a 72 yo M here for ARD with hypoxia. 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 He was initallly placed on high flow 50% on admission and then weaned down to Bipap [facility] on days / Bipap on nights) He has been in NSR per report but is now In afib. He also had a low grade fever on my shift around 7pm (101.1 ax- he is a mouth breather) and I gave him 650 of tylenol (temp now 98.6 ax- as he is on bipap)      Pertinent findings on chart review: History above corroborated. Patient withRespiratory failure secondary to left mainstem bronchus obstruction, aspiration pneumoniaALS, Patient is DNR/DNI    11/28/2022    2:30 AM 12/06/2022    2:15 AM 11/30/2022    2:07 AM  Vitals with BMI  Systolic 90 82   Diastolic 60 62   Pulse 108  106   EKG:    Cardiac Panel (last 3 results) Recent Labs    2022/12/18 1534 12/15/2022 0241  TROPONINIHS 20* 52*     Patient assessment Patient resting comfortably Physical Exam Vitals and nursing note reviewed.  Constitutional:      General: He is not in acute distress. HENT:     Head: Normocephalic and atraumatic.  Cardiovascular:     Rate and Rhythm: Normal rate and regular rhythm.     Heart sounds: Normal heart sounds.  Pulmonary:     Effort: Pulmonary effort is normal.     Breath sounds: Normal breath sounds.  Abdominal:     Palpations: Abdomen is soft.     Tenderness: There is no abdominal tenderness.  Neurological:     Mental Status: Mental status is at baseline.      Assessment and  Interventions   Assessment:  STEMI New onset A-fib controlled rate Hypotension New ST elevation lateral  leads  Plan: Called and spoke with Dr. Okey Dupre who agreed that patient is having a STEMI.  Discussed that given his current clinical situation and DNR/DNI which will have to be reversed for cardiac cath, to discussed with wife regarding goals of care : Discussed with wife Donald Gould regarding options for cardiac catheterization versus heparin and aspirin need to reverse DNR/DNI for procedure.  After long conversation she decided that as patient appears comfortable and not having chest pain she would rather proceed with a conservative measure of heparin and aspirin and declined patient going to the Cath Lab.  This was subsequently communicated to Dr. Okey Dupre who will see patient in the next few hours Heparin and aspirin ordered      CRITICAL CARE Performed by: Andris Baumann   Total critical care time: 90 minutes  Critical care time was exclusive of separately billable procedures and treating other patients.  Critical care was necessary to treat or prevent imminent or life-threatening deterioration.  Critical care was time spent personally by me on the following activities: development of treatment plan with patient and/or surrogate as well as nursing, discussions with consultants, evaluation of patient's response to treatment, examination of patient, obtaining history from patient  or surrogate, ordering and performing treatments and interventions, ordering and review of laboratory studies, ordering and review of radiographic studies, pulse oximetry and re-evaluation of patient's condition.

## 2022-12-17 NOTE — IPAL (Signed)
Interdisciplinary Goals of Care Family Meeting   Date carried out: 12/08/2022  Location of the meeting: Bedside  Member's involved: Physician, Bedside Registered Nurse, Family Member or next of kin, and Palliative care team member     GOALS OF CARE DISCUSSION  The Clinical status was relayed to family in detail- Wife at Bedside  Updated and notified of patients medical condition- Patient remains unresponsive and will not open eyes to command.   Patient is having a weak cough and struggling to remove secretions.   Patient with increased WOB and using accessory muscles to breathe Explained to family course of therapy and the modalities   Patient with Progressive multiorgan failure with a very high probablity of a very minimal chance of meaningful recovery despite all aggressive and optimal medical therapy.  PATIENT REMAINS DNR/DNI STATUS Patient is suffering and suffocating and in the process of dying  Family understands the situation.  Wife has consented and agreed to DNR/DNI and would like to proceed with Comfort care measures.  Family are satisfied with Plan of action and management. All questions answered  Additional CC time 25 mins   Takeira Yanes Santiago Glad, M.D.  [facility]  Medical Director [facility] Medical Director [facility] Cardio-Pulmonary Department

## 2022-12-17 NOTE — Progress Notes (Addendum)
ANTICOAGULATION CONSULT NOTE  Pharmacy Consult for heparin infusion Indication: ACS/STEMI  Allergies  Allergen Reactions   Other Other (See Comments)    Hip pain  Muscle and joint issues.  Joint pain    Hip pain  Muscle and joint issues.   Statins Rash    Joint pain    Patient Measurements: Height: 6\' 3"  (190.5 cm) Weight: 74.2 kg (163 lb 9.3 oz) IBW/kg (Calculated) : 84.5 Heparin Dosing Weight: 74.2 kg  Vital Signs: Temp: 98.3 F (36.8 C) (09/18 0015) Temp Source: Axillary (09/18 0015) BP: 90/60 (09/18 0230) Pulse Rate: 108 (09/18 0230)  Labs: Recent Labs    12/10/2022 1534 12/02/22 0319 12/03/22 0412 11/17/2022 0241 11/20/2022 0310  HGB 13.0 12.2* 11.9* 13.6  --   HCT 40.2 38.0* 36.8* 43.2  --   PLT 170 172 169 148*  --   LABPROT  --   --   --   --  14.5  INR  --   --   --   --  1.1  CREATININE 0.49* 0.38* 0.49* 0.55*  --   TROPONINIHS 20*  --   --  52*  --     Estimated Creatinine Clearance: 85 mL/min (A) (by C-G formula based on SCr of 0.55 mg/dL (L)).   Medical History: Past Medical History:  Diagnosis Date   ALS (amyotrophic lateral sclerosis) (HCC)    Anemia, unspecified 09/14/2022   Environmental allergies     Assessment: Pt is a 72 yo male admitted on 12/07/2022 for ARF w/ hypoxia, now found with elevated troponin I, trending up.  Pt previously ordered heparin 5000 units q8hr, last dose 9//17/24 @ 2040.  Goal of Therapy:  Heparin level 0.3-0.7 units/ml Monitor platelets by anticoagulation protocol: Yes   Plan:  Since it has been ~ 8 hrs since last dose, bolus 4000 units x 1 Start heparin infusion at 1000 units/hr Will check HL in 8 hr after start of infusion Will check aPTT and INR w/ first HL CBC daily while on heparin  Otelia Sergeant, PharmD, [facility] 11/27/2022 3:59 AM

## 2022-12-17 NOTE — Progress Notes (Signed)
Late entering. Mr Donald Gould passed away today 12/11/2022 at 1247 pm. Wife and nieces at bedside with patient. Patient was made comfort care per patients and wife wishes. Pain and symptoms were managed effectively with morphine gtt and patient remained comfortable until time of passing. Family members were present and have been informed of the patient's passing. Emotional support and bereavement resources have been offered.

## 2022-12-17 NOTE — Progress Notes (Signed)
EKG preformed at 02:52 as heart rhythm is sustained in A-fib. MD Para March aware. No new orders at this time

## 2022-12-17 NOTE — Progress Notes (Signed)
Palliative:  HPI: 72 y.o. male  with past medical history of ALS, aphasia, dysphagia, aspiration, BPH, malnutrition, s/p PEG, SBO admitted on 12-05-2022 with shortness of breath with aspiration pneumonia in the setting of progressing ALS.   I met today with Donald Gould along with Donald Gould. Donald Gould has had significant decline since my visit yesterday. He is nearing end of life. Discussed with Donald Gould path forward and recommendation for comfort care. Discussed symptom management at end of life with morphine infusion. Donald Gould agrees with comfort care. Morphine infusion will be started to provide comfort. Family being called to come and visit. After he is comfortable on morphine infusion and family to bedside we will remove BiPAP and transition to full comfort care to allow natural death.   Update: Returned to bedside and explained to support to family at bedside as BiPAP is removed. Discussed signs/symptoms of discomfort as well as progression at end of life. Emotional support provided.   Exam: Unresponsive. BiPAP. Labored breathing improved with morphine. Tachycardic - Afib. Abd soft. Extremities cool to touch.   Plan: - DNR/DNI - Full comfort care - orders in place - Anticipate Gould death  55 min  Donald Channel, NP Palliative Medicine Team Pager [phone] (Please see [email or web address] for schedule) Team Phone [phone]    Greater than 50%  of this time was spent counseling and coordinating care related to the above assessment and plan

## 2022-12-17 NOTE — Consult Note (Signed)
NAME:  Creeden Demers, MRN:  [id number], DOB:  [birth year], LOS: 3 ADMISSION DATE:  12-25-22, CONSULTATION DATE:  12/25/2022 REFERRING MD:  Earnie Larsson  CHIEF COMPLAINT:  Acute Respiratory Failure    HPI  72 y.o male with significant PMH of ALS (amyotrophic lateral sclerosis), thrombocytopenia, Chronic Anemia, unintentional  weight loss, dysphagia  w/recurrent aspiration and Aspiration pneumonia s/p PEG placement, BPH, HLD, who presented to the ED with chief complaints of near syncopal episode and possible aspiration.   ED Course: Initial vital signs showed HR of 92 beats/minute, BP 142/80 mm Hg, the RR 31 breaths/minute, and the oxygen saturation 92% on [place] and a temperature of 98.24F (36.7C). Pertinent Labs/Diagnostics Findings: Na+/ K+:139/4.0  Glucose: 111 BUN/Cr.:24/0.49 WBC:12.3  PCT: negative <0.10  Lactic acid: 0.8 COVID PCR: Negative,  troponin:20  CTA Chest> Aspirated debris within the trachea and occluding the left mainstem bronchus and multiple left lingular and lower lobe bronchi.  Patient was treated in the ED with DuoNebs and hypertonic saline nebulizers.  Was started on broad-spectrum antibiotic for aspiration pneumonia and admitted to hospitalist service.  PCCM consulted to evaluate for possible bronchoscopy and high risk for intubation, Discussed with Wife and MOST from reviewed, patient at high risk for vent support if to perform bronch and giving sedation   Past Medical History  ALS (amyotrophic lateral sclerosis), thrombocytopenia, Chronic Anemia, unintentional  weight loss, dysphagia  w/recurrent aspiration and Aspiration pneumonia s/p PEG placement, BPH, HLD,  Significant Hospital Events   2022-12-25: Admitted to ICU with acute hypoxic respiratory failure in the setting of aspiration pneumonia and progressive Neuromuscular lung disease  9/16 resp distress, wean off biPAP as tolerated 9/17 FiO2 (%):  [35 %-100 %] 100 % PEEP:  [8 cmH20] 8 cmH20    Consults:   PCCM  Procedures:  None  Significant Diagnostic Tests:  Dec 25, 2022: Chest Xray> IMPRESSION: Mild peripheral increased septal markings suggest mild interstitial edema. No airspace opacities or atelectasis identified.  2022-12-25: CTA Chest > IMPRESSION: Aspirated debris within the trachea and occluding the left mainstem bronchus and multiple left lingular and lower lobe bronchi.  Interim History / Subjective:    Severe resp failure on biPAP Increased WOB Patient is in the dying process   Micro Data:  25-Dec-2022: SARS-CoV-2 PCR> negative 2022-12-25: Influenza PCR> negative 2022/12/25: Blood culture x2> 9/18: MRSA PCR>>   Antimicrobials:  Azithromycin 25-Dec-2022 x 1 Ceftriaxone 12/25/2022 x 1 Unasyn 9/16>  OBJECTIVE  Blood pressure (!) 152/74, pulse (!) 107, temperature 97.9 F (36.6 C), temperature source Axillary, resp. rate (!) 34, height 6\' 3"  (1.905 m), weight 76 kg, SpO2 (!) 77%.    FiO2 (%):  [35 %-100 %] 100 % PEEP:  [8 cmH20] 8 cmH20   Intake/Output Summary (Last 24 hours) at 11/27/2022 0943 Last data filed at 11/23/2022 3295 Gross per 24 hour  Intake 2343.65 ml  Output 976 ml  Net 1367.65 ml   Filed Weights   12/25/22 1456 12/03/22 0444 12/08/2022 0500  Weight: 75.3 kg 74.2 kg 76 kg        REVIEW OF SYSTEMS  PATIENT IS UNABLE TO PROVIDE COMPLETE REVIEW OF SYSTEMS DUE TO SEVERE CRITICAL ILLNESS   PHYSICAL EXAMINATION:  GENERAL:critically ill appearing, +resp distress EYES: Pupils equal, round, reactive to light.  No scleral icterus.  MOUTH: Moist mucosal membrane BiPAP NECK: Supple.  PULMONARY: Lungs clear to auscultation, +rhonchi, +wheezing CARDIOVASCULAR: S1 and S2.  Regular rate and rhythm GASTROINTESTINAL: Soft, nontender, -distended. Positive bowel sounds.  MUSCULOSKELETAL: No  swelling, clubbing, or edema.  NEUROLOGIC: obtunded SKIN:normal, warm to touch, Capillary refill delayed  Pulses present bilaterally        Labs/imaging that I havepersonally  reviewed  (right click and "Reselect all SmartList Selections" daily)     Labs   CBC: Recent Labs  Lab 11/22/2022 1534 12/02/22 0319 12/03/22 0412 12-23-2022 0241  WBC 12.3* 12.1* 14.6* 12.8*  HGB 13.0 12.2* 11.9* 13.6  HCT 40.2 38.0* 36.8* 43.2  MCV 92.2 91.6 92.0 95.8  PLT 170 172 169 148*    Basic Metabolic Panel: Recent Labs  Lab 11/28/2022 1534 12/02/22 0319 12/03/22 0412 2022/12/23 0241  NA 139 138 139 139  K 4.0 3.8 3.9 4.8  CL 97* 101 98 101  CO2 30 28 32 31  GLUCOSE 111* 115* 115* 132*  BUN 24* 20 23 30*  CREATININE 0.49* 0.38* 0.49* 0.55*  CALCIUM 9.1 8.9 8.8* 8.8*  MG 2.2 2.3  --   --   PHOS 4.0 3.9 3.2 3.3   GFR: Estimated Creatinine Clearance: 87.1 mL/min (A) (by C-G formula based on SCr of 0.55 mg/dL (L)). Recent Labs  Lab 11/17/2022 1534 11/30/2022 1849 11/25/2022 2353 12/02/22 0319 12/03/22 0412 2022/12/23 0241  PROCALCITON <0.10  --   --   --   --   --   WBC 12.3*  --   --  12.1* 14.6* 12.8*  LATICACIDVEN  --  0.8 0.8  --   --   --     Liver Function Tests: Recent Labs  Lab 11/17/2022 1534 12/02/22 0319 12/03/22 0412 12/23/2022 0241  AST 30 24  --   --   ALT 26 24  --   --   ALKPHOS 48 43  --   --   BILITOT 0.7 0.9  --   --   PROT 6.6 5.8*  --   --   ALBUMIN 3.6 3.5 3.2* 3.1*  CBG: Recent Labs  Lab 12/03/22 1516 12/03/22 1956 12/03/22 2337 12-23-2022 0320 12-23-2022 0746  GLUCAP 129* 170* 169* 105* 111*    Assessment & Plan:   Acute Hypoxic Respiratory Failure due to Aspiration Pneumonia  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 Severe ACUTE Hypoxic and Hypercapnic Respiratory Failure Oxygen as needed BD therapy    ASPIRATION PNEUMONIA Sepsis due to Aspiration Pneumonia -Antibiotics as below -switch to Unasyn for  Aspiration coverage -Gentle IVF hydration as needed  PATIENT IS NOW WITH SEVERE RESP FAILURE AND IS NOW IN THE DYING PROCESS   ALS END STAGE Progressive with complicating features   Failure to thrive Dysphagia  PALLIATIVE CARE AT BEDSIDE RECOMMEND COMFORT CARE MEASURES   Best practice:  Diet:  Tube Feed  Pain/Anxiety/Delirium protocol (if indicated): No VAP protocol (if indicated): Not indicated DVT prophylaxis: Subcutaneous Heparin GI prophylaxis: PPI Glucose control:  SSI Yes Central venous access:  N/A Arterial line:  N/A Foley:  N/A Mobility:  bed rest  PT consulted: N/A Last date of multidisciplinary goals of care discussion [9/16] Code Status: DNR/DNI Disposition: ICU      DVT/GI PRX  assessed I Assessed the need for Labs I Assessed the need for Foley I Assessed the need for Central Venous Line Family Discussion when available I Assessed the need for Mobilization I made an Assessment of medications to be adjusted accordingly Safety  Risk assessment completed  CASE DISCUSSED IN MULTIDISCIPLINARY ROUNDS WITH ICU TEAM     Critical Care Time devoted to patient care services described in this note is 65 minutes.  Critical care was necessary to treat /prevent imminent and life-threatening deterioration. Overall, patient is critically ill, prognosis is guarded.  Patient with Multiorgan failure and at high risk for cardiac arrest and death.    Lucie Leather, M.D.  [facility]  Medical Director [facility] Medical Director [facility] Cardio-Pulmonary Department

## 2022-12-17 NOTE — Plan of Care (Signed)
Problem: Respiratory: Goal: Ability to maintain adequate ventilation will improve Outcome: Progressing Goal: Ability to maintain a clear airway will improve Outcome: Progressing   Problem: Pain Managment: Goal: General experience of comfort will improve Outcome: Progressing

## 2022-12-17 DEATH — deceased

## 2023-02-10 ENCOUNTER — Ambulatory Visit: Payer: Medicare Other | Admitting: Podiatry
